# Patient Record
Sex: Male | Born: 1935 | Race: White | Hispanic: No | Smoking: Never smoker
Health system: Southern US, Community
[De-identification: ages and names within clinical notes are randomized; demographics above are authoritative.]

## PROBLEM LIST (undated history)

## (undated) DIAGNOSIS — J45909 Unspecified asthma, uncomplicated: Secondary | ICD-10-CM

## (undated) DIAGNOSIS — I639 Cerebral infarction, unspecified: Secondary | ICD-10-CM

## (undated) DIAGNOSIS — I509 Heart failure, unspecified: Secondary | ICD-10-CM

## (undated) DIAGNOSIS — I499 Cardiac arrhythmia, unspecified: Secondary | ICD-10-CM

## (undated) DIAGNOSIS — E785 Hyperlipidemia, unspecified: Secondary | ICD-10-CM

## (undated) DIAGNOSIS — R0602 Shortness of breath: Secondary | ICD-10-CM

## (undated) DIAGNOSIS — G459 Transient cerebral ischemic attack, unspecified: Secondary | ICD-10-CM

## (undated) HISTORY — DX: Heart failure, unspecified: I50.9

## (undated) HISTORY — DX: Shortness of breath: R06.02

## (undated) HISTORY — DX: Cerebral infarction, unspecified: I63.9

## (undated) HISTORY — PX: HERNIA REPAIR: SHX51

## (undated) HISTORY — PX: PACEMAKER IMPLANT: EP1218

---

## 2006-08-30 ENCOUNTER — Ambulatory Visit: Payer: Self-pay | Admitting: Ophthalmology

## 2007-06-13 ENCOUNTER — Ambulatory Visit: Payer: Self-pay | Admitting: Ophthalmology

## 2007-09-15 ENCOUNTER — Other Ambulatory Visit: Payer: Self-pay

## 2007-09-15 ENCOUNTER — Ambulatory Visit: Payer: Self-pay | Admitting: Otolaryngology

## 2008-07-10 ENCOUNTER — Ambulatory Visit: Payer: Self-pay

## 2008-08-16 ENCOUNTER — Ambulatory Visit: Payer: Self-pay | Admitting: Cardiology

## 2008-08-27 ENCOUNTER — Ambulatory Visit: Payer: Self-pay | Admitting: Internal Medicine

## 2008-10-24 ENCOUNTER — Ambulatory Visit: Payer: Self-pay | Admitting: Surgery

## 2008-10-31 ENCOUNTER — Ambulatory Visit: Payer: Self-pay | Admitting: Surgery

## 2008-10-31 ENCOUNTER — Emergency Department: Payer: Self-pay | Admitting: Emergency Medicine

## 2008-11-28 ENCOUNTER — Ambulatory Visit: Payer: Self-pay | Admitting: Otolaryngology

## 2009-08-03 HISTORY — PX: APPENDECTOMY: SHX54

## 2010-04-29 ENCOUNTER — Ambulatory Visit: Payer: Self-pay | Admitting: Surgery

## 2011-03-11 ENCOUNTER — Inpatient Hospital Stay: Payer: Self-pay | Admitting: Family Medicine

## 2012-07-27 ENCOUNTER — Inpatient Hospital Stay: Payer: Self-pay | Admitting: Surgery

## 2012-07-27 LAB — COMPREHENSIVE METABOLIC PANEL
Albumin: 3.5 g/dL (ref 3.4–5.0)
BUN: 25 mg/dL — ABNORMAL HIGH (ref 7–18)
Bilirubin,Total: 0.4 mg/dL (ref 0.2–1.0)
Chloride: 107 mmol/L (ref 98–107)
EGFR (Non-African Amer.): 54 — ABNORMAL LOW
Glucose: 137 mg/dL — ABNORMAL HIGH (ref 65–99)
Osmolality: 290 (ref 275–301)
SGOT(AST): 27 U/L (ref 15–37)
SGPT (ALT): 23 U/L (ref 12–78)
Total Protein: 6.9 g/dL (ref 6.4–8.2)

## 2012-07-27 LAB — URINALYSIS, COMPLETE
Bacteria: NONE SEEN
Bilirubin,UR: NEGATIVE
Leukocyte Esterase: NEGATIVE
Nitrite: NEGATIVE
Protein: NEGATIVE
RBC,UR: 1 /HPF (ref 0–5)
WBC UR: <1 /HPF (ref 0–5)

## 2012-07-27 LAB — CBC
HGB: 12.3 g/dL — ABNORMAL LOW (ref 13.0–18.0)
MCHC: 34.6 g/dL (ref 32.0–36.0)
Platelet: 187 10*3/uL (ref 150–440)
RDW: 13.3 % (ref 11.5–14.5)

## 2014-01-14 DIAGNOSIS — J45909 Unspecified asthma, uncomplicated: Secondary | ICD-10-CM | POA: Insufficient documentation

## 2014-01-14 DIAGNOSIS — K219 Gastro-esophageal reflux disease without esophagitis: Secondary | ICD-10-CM | POA: Insufficient documentation

## 2014-01-14 DIAGNOSIS — N183 Chronic kidney disease, stage 3 unspecified: Secondary | ICD-10-CM | POA: Insufficient documentation

## 2014-02-03 DIAGNOSIS — Z8673 Personal history of transient ischemic attack (TIA), and cerebral infarction without residual deficits: Secondary | ICD-10-CM | POA: Insufficient documentation

## 2014-02-14 DIAGNOSIS — Z950 Presence of cardiac pacemaker: Secondary | ICD-10-CM | POA: Insufficient documentation

## 2014-11-02 ENCOUNTER — Other Ambulatory Visit
Admit: 2014-11-02 | Disposition: A | Discharge status: Home / Self Care | Payer: Self-pay | Attending: Gastroenterology | Admitting: Gastroenterology

## 2014-11-02 LAB — CLOSTRIDIUM DIFFICILE(ARMC)

## 2014-11-16 ENCOUNTER — Other Ambulatory Visit
Admit: 2014-11-16 | Disposition: A | Discharge status: Home / Self Care | Payer: Self-pay | Attending: Gastroenterology | Admitting: Gastroenterology

## 2014-11-16 LAB — CLOSTRIDIUM DIFFICILE(ARMC)

## 2014-11-20 NOTE — Op Note (Signed)
PATIENT NAME:  Elijah Collins, Elijah Collins MR#:  [id number] DATE OF BIRTH:  1924/09/02  DATE OF PROCEDURE:  07/28/2012  PREOPERATIVE DIAGNOSIS: Acute appendicitis.   POSTOPERATIVE DIAGNOSIS: Acute appendicitis.   PROCEDURE PERFORMED: Laparoscopic appendectomy.   SURGEON: Quentin Orealph Collins. Ely, M.D.   ANESTHESIA: General.   DESCRIPTION OF PROCEDURE:  With the patient in the supine position, after induction of appropriate general anesthesia, the patient's abdomen was prepped with ChloraPrep and draped with sterile towels. The patient was placed in head down, feet up position. A small infraumbilical incision was made in the standard fashion and carried down bluntly through the subcutaneous tissue. The Veress needle was used to cannulate the peritoneal cavity. CO2 was insufflated to appropriate pressure measurements. When approximately 2.5 liters of CO2 were instilled, the Veress needle was withdrawn and an 11 mm [employer] port was inserted into the peritoneal cavity. Intraperitoneal position was confirmed and CO2 was reinsufflated. A right upper quadrant transverse incision was made and an 11 mm port inserted under direct vision. The right lower quadrant was investigated. There appeared to be significant suppurative appendicitis without evidence of rupture. The right lower quadrant transverse incision was made and a 12 mm port inserted under direct vision. The camera was moved to the upper port and dissection carried out through the two lower ports. The base of the appendix was identified and appeared to be free of significant disease. A window was created behind the base of the appendix and it was divided with single application of the Endo GIA stapling device carrying a blue load. Meso appendix was foreshortened and quite thickened and required multiple applications of the Endo GIA stapler carrying a white load to divide the mesoappendix. It was then captured in the Endo Catch apparatus and removed without difficulty.  The area was copiously suctioned and irrigated with 2 liters of warm saline solution. There did not appear to be any evidence of bleeding or infection. The abdomen was desufflated following Vicryl figure-of-eight closure of the right lower quadrant incision using the needle passer. All ports were withdrawn without difficulty. Skin incisions were closed with 5-0 nylon. The area was infiltrated with 0.25% Marcaine for postoperative pain control. Sterile dressings were applied. The patient was returned to the recovery room having tolerated the procedure well. Sponge, instrument and needle counts were correct x 2 in the operating room.  ____________________________ Carmie Endalph Collins. Ely III, MD rle:sb D: 07/28/2012 05:21:04 ET T: 07/28/2012 08:35:25 ET JOB#: [id number]  cc: Carmie Endalph Collins. Ely III, MD, <Dictator> Marya AmslerMarshall W. Dareen PianoAnderson, MD Quentin OreALPH Collins ELY MD ELECTRONICALLY SIGNED 07/29/2012 21:54

## 2014-11-20 NOTE — H&P (Signed)
PATIENT NAME:  Elijah Collins, REISTER MR#:  [id number] DATE OF BIRTH:  [birth year]  DATE OF ADMISSION:  07/27/2012  PRIMARY CARE PHYSICIAN: Einar Crow.   CARDIOLOGIST: Jamse Mead.   ADMITTING PHYSICIAN: Dr. Michela Pitcher.   CHIEF COMPLAINT: Abdominal pain.   BRIEF HISTORY: The patient is an 81 year old gentleman seen in the Emergency Room with a 16 to 18 hour history of abdominal pain. He woke this morning early and noted some marked suprapubic, periumbilical, right lower quadrant pain. The pain intensified over the course of the day. He was mildly anorexic but was not nauseated and did not vomit. He had no symptoms yesterday. Pain increased over the course of the day, and he came to the Emergency Room for further evaluation. Workup revealed an elevated white blood cell count, relatively normal electrolytes. Contrasted CT scan was performed which demonstrated a thickened edematous appendix with fluid and inflammatory change surrounding it, consistent with acute appendicitis. Surgical service was consulted.   The patient denies any previous similar GI problems. He denies a history of hepatitis, yellow jaundice, pancreatitis, peptic ulcer disease, gallbladder disease or diverticulitis. He has had previous laparoscopic hernia repair. He has long-standing cardiac disease, currently with a pacemaker, followed by Dr. Jamse Mead. He has had previous TIA in the past. He is hypertensive but is not diabetic. He last saw Dr. Darrold Junker about a year ago. He has not had any recent evaluations.   CURRENT MEDICATIONS: Include aspirin 81 mg p.o. daily, Bactrim DS 1 mg p.o. b.i.d., Centrum Silver 1 tablet daily, Lipitor 20 mg p.o. daily, Plavix 75 mg p.o. daily.   ALLERGIES: No medical allergies.   SOCIAL HISTORY: He is not a cigarette smoker, drinks alcohol only occasionally and is retired.   REVIEW OF SYSTEMS: A 10-point review of systems undertaken with the patient and is unremarkable.   FAMILY HISTORY:  Noncontributory.   PHYSICAL EXAMINATION:  GENERAL: He is an alert, pleasant gentleman in mild discomfort.  VITAL SIGNS: Temperature is 99.4, heart rate 75, blood pressure 135/82. Oxygen saturation is satisfactory.  HEENT: Unremarkable. There is no scleral icterus. No pupillary abnormalities. No facial deformities.  NECK: Supple and nontender with no adenopathy. His trachea is midline.  CHEST: Clear with no rales or rhonchi. He has normal pulmonary excursion without pleuritic chest pain.  CARDIAC: No murmurs or gallops to my ear, and he seems to be in normal sinus rhythm.  ABDOMEN: Soft but he has marked right lower quadrant tenderness, point tenderness and guarding. He has rebound and referred rebound to the right lower quadrant. He has hypoactive but present bowel sounds.  LOWER EXTREMITIES: Full range of motion. No deformities and good distal pulses.  PSYCHIATRIC: Normal orientation, normal affect.   RADIOLOGY: I have independently reviewed his CT scan. The scan does demonstrate thickened edematous appendix with surrounding inflammatory change. There is no free air, but changes are concerning for possible ruptured appendix.   ASSESSMENT AND PLAN: This gentleman has marked risk profile with his aspirin and Plavix. We discussed the possibility of nonoperative intervention with straight forward antibiotic therapy. The patient was appraised of the possible risk of rupture and ongoing consequences. The patient would like to proceed with surgical intervention. Risk of bleeding has been outlined to him in detail. Will check an EKG and chest x-ray while we are waiting for the operating room and plan to proceed this evening if there are no significant abnormalities. The patient is in agreement with this plan.    ____________________________ Quentin Ore III,  MD rle:gb D: 07/27/2012 21:38:15 ET T: 07/27/2012 22:11:25 ET JOB#: [id number]  cc: Carmie Endalph L. Ely III, MD, <Dictator> Marya AmslerMarshall W. Dareen PianoAnderson,  MD Marcina MillardAlexander Paraschos, MD Quentin OreALPH L ELY MD ELECTRONICALLY SIGNED 07/29/2012 21:53

## 2016-03-03 DEATH — deceased

## 2016-07-09 DIAGNOSIS — R0609 Other forms of dyspnea: Secondary | ICD-10-CM | POA: Insufficient documentation

## 2016-11-09 ENCOUNTER — Observation Stay
Admit: 2016-11-09 | Discharge: 2016-11-09 | Disposition: A | Discharge status: Home / Self Care | Payer: Medicare Other | Attending: Specialist | Admitting: Specialist

## 2016-11-09 ENCOUNTER — Observation Stay: Payer: Medicare Other

## 2016-11-09 ENCOUNTER — Emergency Department: Payer: Medicare Other

## 2016-11-09 ENCOUNTER — Observation Stay
Admission: EM | Admit: 2016-11-09 | Discharge: 2016-11-10 | Disposition: A | Discharge status: Home / Self Care | Payer: Medicare Other | Attending: Internal Medicine | Admitting: Internal Medicine

## 2016-11-09 ENCOUNTER — Encounter: Payer: Self-pay | Admitting: Emergency Medicine

## 2016-11-09 DIAGNOSIS — J45909 Unspecified asthma, uncomplicated: Secondary | ICD-10-CM | POA: Diagnosis not present

## 2016-11-09 DIAGNOSIS — Z7982 Long term (current) use of aspirin: Secondary | ICD-10-CM | POA: Insufficient documentation

## 2016-11-09 DIAGNOSIS — Z8673 Personal history of transient ischemic attack (TIA), and cerebral infarction without residual deficits: Secondary | ICD-10-CM | POA: Diagnosis not present

## 2016-11-09 DIAGNOSIS — Z823 Family history of stroke: Secondary | ICD-10-CM | POA: Insufficient documentation

## 2016-11-09 DIAGNOSIS — G451 Carotid artery syndrome (hemispheric): Secondary | ICD-10-CM

## 2016-11-09 DIAGNOSIS — Z7902 Long term (current) use of antithrombotics/antiplatelets: Secondary | ICD-10-CM | POA: Diagnosis not present

## 2016-11-09 DIAGNOSIS — G459 Transient cerebral ischemic attack, unspecified: Secondary | ICD-10-CM | POA: Diagnosis present

## 2016-11-09 DIAGNOSIS — I10 Essential (primary) hypertension: Secondary | ICD-10-CM | POA: Diagnosis not present

## 2016-11-09 DIAGNOSIS — Z950 Presence of cardiac pacemaker: Secondary | ICD-10-CM | POA: Insufficient documentation

## 2016-11-09 DIAGNOSIS — E785 Hyperlipidemia, unspecified: Secondary | ICD-10-CM | POA: Insufficient documentation

## 2016-11-09 DIAGNOSIS — R29700 NIHSS score 0: Secondary | ICD-10-CM | POA: Diagnosis not present

## 2016-11-09 HISTORY — DX: Transient cerebral ischemic attack, unspecified: G45.9

## 2016-11-09 HISTORY — DX: Unspecified asthma, uncomplicated: J45.909

## 2016-11-09 HISTORY — DX: Cardiac arrhythmia, unspecified: I49.9

## 2016-11-09 LAB — CBC
HCT: 38.4 % — ABNORMAL LOW (ref 40.0–52.0)
Hemoglobin: 13.3 g/dL (ref 13.0–18.0)
MCH: 33.8 pg (ref 26.0–34.0)
MCHC: 34.6 g/dL (ref 32.0–36.0)
MCV: 97.8 fL (ref 80.0–100.0)
Platelets: 193 10*3/uL (ref 150–440)
RBC: 3.92 MIL/uL — ABNORMAL LOW (ref 4.40–5.90)
RDW: 13.3 % (ref 11.5–14.5)
WBC: 7.5 10*3/uL (ref 3.8–10.6)

## 2016-11-09 LAB — COMPREHENSIVE METABOLIC PANEL
ALK PHOS: 33 U/L — AB (ref 38–126)
ALT: 22 U/L (ref 17–63)
AST: 25 U/L (ref 15–41)
Albumin: 4.1 g/dL (ref 3.5–5.0)
Anion gap: 7 (ref 5–15)
BILIRUBIN TOTAL: 0.7 mg/dL (ref 0.3–1.2)
BUN: 22 mg/dL — ABNORMAL HIGH (ref 6–20)
CALCIUM: 9.5 mg/dL (ref 8.9–10.3)
CO2: 27 mmol/L (ref 22–32)
CREATININE: 1.16 mg/dL (ref 0.61–1.24)
Chloride: 106 mmol/L (ref 101–111)
GFR, EST NON AFRICAN AMERICAN: 54 mL/min — AB (ref >60)
Glucose, Bld: 99 mg/dL (ref 65–99)
Potassium: 4.5 mmol/L (ref 3.5–5.1)
Sodium: 140 mmol/L (ref 135–145)
Total Protein: 7.2 g/dL (ref 6.5–8.1)

## 2016-11-09 LAB — DIFFERENTIAL
Basophils Absolute: 0 10*3/uL (ref 0–0.1)
Basophils Relative: 0 %
Eosinophils Absolute: 0.2 10*3/uL (ref 0–0.7)
Eosinophils Relative: 3 %
LYMPHS ABS: 2.1 10*3/uL (ref 1.0–3.6)
LYMPHS PCT: 28 %
MONO ABS: 0.7 10*3/uL (ref 0.2–1.0)
MONOS PCT: 9 %
NEUTROS ABS: 4.5 10*3/uL (ref 1.4–6.5)
Neutrophils Relative %: 60 %

## 2016-11-09 LAB — PROTIME-INR
INR: 0.97
PROTHROMBIN TIME: 12.9 s (ref 11.4–15.2)

## 2016-11-09 LAB — APTT: aPTT: 35 seconds (ref 24–36)

## 2016-11-09 LAB — TROPONIN I

## 2016-11-09 LAB — GLUCOSE, CAPILLARY: GLUCOSE-CAPILLARY: 91 mg/dL (ref 65–99)

## 2016-11-09 MED ORDER — ADULT MULTIVITAMIN W/MINERALS CH
1.0000 | ORAL_TABLET | Freq: Every day | ORAL | Status: DC
  Administered 2016-11-10: 1 via ORAL
  Filled 2016-11-09: qty 1

## 2016-11-09 MED ORDER — ONDANSETRON HCL 4 MG PO TABS: 4.0000 mg | ORAL_TABLET | Freq: Four times a day (QID) | ORAL | Status: DC | PRN

## 2016-11-09 MED ORDER — IOPAMIDOL (ISOVUE-370) INJECTION 76%
75.0000 mL | Freq: Once | INTRAVENOUS | Status: AC | PRN
  Administered 2016-11-09: 75 mL via INTRAVENOUS

## 2016-11-09 MED ORDER — MULTI-VITAMINS PO TABS: 1.0000 | ORAL_TABLET | Freq: Every day | ORAL | Status: DC

## 2016-11-09 MED ORDER — ACETAMINOPHEN 650 MG RE SUPP: 650.0000 mg | Freq: Four times a day (QID) | RECTAL | Status: DC | PRN

## 2016-11-09 MED ORDER — ENOXAPARIN SODIUM 40 MG/0.4ML ~~LOC~~ SOLN
40.0000 mg | SUBCUTANEOUS | Status: DC
  Administered 2016-11-09: 40 mg via SUBCUTANEOUS
  Filled 2016-11-09: qty 0.4

## 2016-11-09 MED ORDER — ONDANSETRON HCL 4 MG/2ML IJ SOLN: 4.0000 mg | Freq: Four times a day (QID) | INTRAMUSCULAR | Status: DC | PRN

## 2016-11-09 MED ORDER — ASPIRIN 81 MG PO CHEW: 81.0000 mg | CHEWABLE_TABLET | Freq: Every day | ORAL | Status: DC

## 2016-11-09 MED ORDER — CLOPIDOGREL BISULFATE 75 MG PO TABS
75.0000 mg | ORAL_TABLET | Freq: Every day | ORAL | Status: DC
  Administered 2016-11-10: 75 mg via ORAL
  Filled 2016-11-09: qty 1

## 2016-11-09 MED ORDER — METOPROLOL SUCCINATE ER 50 MG PO TB24
50.0000 mg | ORAL_TABLET | Freq: Every day | ORAL | Status: DC
  Administered 2016-11-10: 50 mg via ORAL
  Filled 2016-11-09: qty 1

## 2016-11-09 MED ORDER — ATORVASTATIN CALCIUM 20 MG PO TABS
20.0000 mg | ORAL_TABLET | Freq: Every day | ORAL | Status: DC
  Administered 2016-11-09: 23:00:00 20 mg via ORAL
  Filled 2016-11-09: qty 1

## 2016-11-09 MED ORDER — ZOLPIDEM TARTRATE 5 MG PO TABS: 5.0000 mg | ORAL_TABLET | Freq: Every evening | ORAL | Status: DC | PRN

## 2016-11-09 MED ORDER — ACETAMINOPHEN 325 MG PO TABS: 650.0000 mg | ORAL_TABLET | Freq: Four times a day (QID) | ORAL | Status: DC | PRN

## 2016-11-09 NOTE — ED Notes (Signed)
Dr. Mayford Knife in room to reassess patient.

## 2016-11-09 NOTE — ED Triage Notes (Signed)
Patient presents to the ED for some confusion that began around 9:30am.  Patient states, "I feel kind of fuzzy headed."  Patient was unable to tell the triage nurse what year it was this year.  Patient is now answering questions appropriately.

## 2016-11-09 NOTE — Progress Notes (Signed)
Chaplain provided an emotional support support to patient, his daughter and the medical team that was treating the patient. After doctors did their tests, Chaplain asked patient if Chaplain could do anything to help him emotionally or spiritually. Patient appreciated the offer but he said he was fine.

## 2016-11-09 NOTE — Consult Note (Addendum)
Reason for Consult:confusion  Referring Physician: Dr. Mayford Knife   CC: conrfusion  HPI: Elijah Collins. is an 83 y.o. male with hx of HTN, PPM placement about 10 yrs ago on asa and plavix.  Woke up normal health around 6AM. At 9:30 AM pt was found to be confused and disoriented which quickly resolved.  Pt is back to baseline. NIHSS is 0  Past Medical History:  Diagnosis Date  . Cardiac arrhythmia   . TIA (transient ischemic attack)     Past Surgical History:  Procedure Laterality Date  . PACEMAKER IMPLANT      No family history on file.  Social History:  reports that he has never smoked. He has never used smokeless tobacco. He reports that he does not drink alcohol. His drug history is not on file.  No Known Allergies  Medications: I have reviewed the patient's current medications.  ROS: History obtained from the patient  General ROS: negative for - chills, fatigue, fever, night sweats, weight gain or weight loss Psychological ROS: negative for - behavioral disorder, hallucinations, memory difficulties, mood swings or suicidal ideation Ophthalmic ROS: negative for - blurry vision, double vision, eye pain or loss of vision ENT ROS: negative for - epistaxis, nasal discharge, oral lesions, sore throat, tinnitus or vertigo Allergy and Immunology ROS: negative for - hives or itchy/watery eyes Hematological and Lymphatic ROS: negative for - bleeding problems, bruising or swollen lymph nodes Endocrine ROS: negative for - galactorrhea, hair pattern changes, polydipsia/polyuria or temperature intolerance Respiratory ROS: negative for - cough, hemoptysis, shortness of breath or wheezing Cardiovascular ROS: negative for - chest pain, dyspnea on exertion, edema or irregular heartbeat Gastrointestinal ROS: negative for - abdominal pain, diarrhea, hematemesis, nausea/vomiting or stool incontinence Genito-Urinary ROS: negative for - dysuria, hematuria, incontinence or urinary  frequency/urgency Musculoskeletal ROS: negative for - joint swelling or muscular weakness Neurological ROS: as noted in HPI Dermatological ROS: negative for rash and skin lesion changes  Physical Examination: Blood pressure (!) 187/88, pulse 86, temperature 97.9 F (36.6 C), temperature source Oral, resp. rate 18, height  (1.778 m), weight 92.1 kg (203 lb 1.6 oz), SpO2 99 %.   Neurological Examination   Mental Status: Alert, oriented, thought content appropriate.  Speech fluent without evidence of aphasia.  Able to follow 3 step commands without difficulty. Cranial Nerves: II: Discs flat bilaterally; Visual fields grossly normal, pupils equal, round, reactive to light and accommodation III,IV, VI: ptosis not present, extra-ocular motions intact bilaterally V,VII: smile symmetric, facial light touch sensation normal bilaterally VIII: hearing normal bilaterally IX,X: gag reflex present XI: bilateral shoulder shrug XII: midline tongue extension Motor: Right : Upper extremity   5/5    Left:     Upper extremity   5/5  Lower extremity   5/5     Lower extremity   5/5 Tone and bulk:normal tone throughout; no atrophy noted Sensory: Pinprick and light touch intact throughout, bilaterally Deep Tendon Reflexes: 2+ and symmetric throughout Plantars: Right: downgoing   Left: downgoing Cerebellar: normal finger-to-nose, normal rapid alternating movements and normal heel-to-shin test Gait: not tested       Laboratory Studies:   Basic Metabolic Panel: No results for input(s): NA, K, CL, CO2, GLUCOSE, BUN, CREATININE, CALCIUM, MG, PHOS in the last 168 hours.  Liver Function Tests: No results for input(s): AST, ALT, ALKPHOS, BILITOT, PROT, ALBUMIN in the last 168 hours. No results for input(s): LIPASE, AMYLASE in the last 168 hours. No results for input(s): AMMONIA in  the last 168 hours.  CBC:  Recent Labs Lab 11/09/16 1146  WBC 7.5  NEUTROABS 4.5  HGB 13.3  HCT 38.4*  MCV  97.8  PLT 193    Cardiac Enzymes: No results for input(s): CKTOTAL, CKMB, CKMBINDEX, TROPONINI in the last 168 hours.  BNP: Invalid input(s): POCBNP  CBG:  Recent Labs Lab 11/09/16 1128  GLUCAP 91    Microbiology: Results for orders placed or performed during the hospital encounter of 11/16/14  [facility])     Status: None   Collection Time: 11/16/14  6:30 AM  Result Value Ref Range Status   Micro Text Report   Final       C.DIFFICILE ANTIGEN       C.DIFFICILE GDH ANTIGEN : POSITIVE   C.DIFFICILE TOXIN A/B     C.DIFFICILE TOXINS A AND/OR B: POSITIVE   INTERPRETATION            Positive for toxigenic C. difficile, active toxin production present.    ANTIBIOTIC                                                        Coagulation Studies: No results for input(s): LABPROT, INR in the last 72 hours.  Urinalysis: No results for input(s): COLORURINE, LABSPEC, PHURINE, GLUCOSEU, HGBUR, BILIRUBINUR, KETONESUR, PROTEINUR, UROBILINOGEN, NITRITE, LEUKOCYTESUR in the last 168 hours.  Invalid input(s): APPERANCEUR  Lipid Panel:  No results found for: CHOL, TRIG, HDL, CHOLHDL, VLDL, LDLCALC  HgbA1C: No results found for: HGBA1C  Urine Drug Screen:  No results found for: LABOPIA, COCAINSCRNUR, LABBENZ, AMPHETMU, THCU, LABBARB  Alcohol Level: No results for input(s): ETH in the last 168 hours.  Other results: EKG: normal EKG, normal sinus rhythm, unchanged from previous tracings.  Imaging: Ct Head Code Stroke W/o Cm  Addendum Date: 11/09/2016   ADDENDUM REPORT: 11/09/2016 11:57 ADDENDUM: Study discussed by telephone with Dr. Daryel November on 11/09/2016 at 1141 hours. Electronically Signed   By: Odessa Fleming M.D.   On: 11/09/2016 11:57   Result Date: 11/09/2016 CLINICAL DATA:  Code stroke. 83 year old male with confusion and slurred speech starting this morning. EXAM: CT HEAD WITHOUT CONTRAST TECHNIQUE: Contiguous axial images were obtained from the base of the skull  through the vertex without intravenous contrast. COMPARISON:  None. FINDINGS: Brain: No midline shift, ventriculomegaly, mass effect, evidence of mass lesion, or acute intracranial hemorrhage. Minimal to mild for age nonspecific white matter hypodensity. No cortical encephalomalacia. No CT evidence of acute cortically based infarct. Vascular: No suspicious intracranial vascular hyperdensity. Skull: No acute osseous abnormality identified. Sinuses/Orbits: Clear. Other: Negative visible orbit and scalp soft tissues. ASPECTS [facility] Stroke Program Early CT Score) - Ganglionic level infarction (caudate, lentiform nuclei, internal capsule, insula, M1-M3 cortex): 7 - Supraganglionic infarction (M4-M6 cortex): 3 Total score (0-10 with 10 being normal): 10 IMPRESSION: 1. No acute cortically based infarct or acute intracranial hemorrhage identified. Mild for age nonspecific white matter changes. 2. ASPECTS is 10. Electronically Signed: By: Odessa Fleming M.D. On: 11/09/2016 11:38     Assessment/Plan:   Elijah BARNEY Sr. is an 83 y.o. male with hx of HTN, PPM placement about 10 yrs ago on plavix.  Woke up normal health around 6AM. At 9:30 AM pt was found to be confused and disoriented which quickly resolved.  Pt is  back to baseline. NIHSS is 0  - TIA work up CTA head and neck as has PPM and unable to obtain MRI 2d echo Pt/ot con't plavix and home ASA dose.  Likely d/c planning in AM  11/09/2016, 12:01 PM

## 2016-11-09 NOTE — ED Provider Notes (Addendum)
[facility] Emergency Department Provider Note       Time seen: ----------------------------------------- 11:23 AM on 11/09/2016 -----------------------------------------     I have reviewed the triage vital signs and the nursing notes.   HISTORY   Chief Complaint Code Stroke    HPI Elijah Collins. is a 83 y.o. male who presents to the ED for confusion that started 2 hours ago. Patient was not answering questions correctly, currently he states he is confused and he cannot remember certain things but otherwise denies any focal neurologic deficits. He denies numbness, tingling, weakness, vision trouble, speech trouble or other complaints.   No past medical history on file.  There are no active problems to display for this patient.   No past surgical history on file.  Allergies Patient has no allergy information on record.  Social History Social History  Substance Use Topics  . Smoking status: Not on file  . Smokeless tobacco: Not on file  . Alcohol use Not on file   Review of Systems Constitutional: Negative for fever. Cardiovascular: Negative for chest pain. Respiratory: Negative for shortness of breath. Gastrointestinal: Negative for abdominal pain, vomiting and diarrhea. Genitourinary: Negative for dysuria. Musculoskeletal: Negative for back pain. Skin: Negative for rash. Neurological: Negative for headaches, focal weakness or numbness.Positive for confusion  10-point ROS otherwise negative.  ____________________________________________   PHYSICAL EXAM:  VITAL SIGNS: ED Triage Vitals [11/09/16 1116]  Enc Vitals Group     BP (!) 187/88     Pulse Rate 86     Resp 18     Temp 97.9 F (36.6 C)     Temp Source Oral     SpO2 99 %     Weight      Height      Head Circumference      Peak Flow      Pain Score      Pain Loc      Pain Edu?      Excl. in GC?     Constitutional: Alert and oriented. Well appearing and in no  distress. Eyes: Conjunctivae are normal. PERRL. Normal extraocular movements. ENT   Head: Normocephalic and atraumatic.   Nose: No congestion/rhinnorhea.   Mouth/Throat: Mucous membranes are moist.   Neck: No stridor. Cardiovascular: Normal rate, regular rhythm. No murmurs, rubs, or gallops. Respiratory: Normal respiratory effort without tachypnea nor retractions. Breath sounds are clear and equal bilaterally. No wheezes/rales/rhonchi. Gastrointestinal: Soft and nontender. Normal bowel sounds Musculoskeletal: Nontender with normal range of motion in extremities. No lower extremity tenderness nor edema. Neurologic:  Normal speech and language. No gross focal neurologic deficits are appreciated. Strength, sensation, cranial nerves appear to be intact. No focal deficits are appreciated Skin:  Skin is warm, dry and intact. No rash noted. Psychiatric: Mood and affect are normal. Speech and behavior are normal.  ____________________________________________  EKG: Interpreted by me. Atrial paced rhythm with a rate of 72 bpm, no evidence of acute abnormality, normal pacemaker spikes.  ____________________________________________  ED COURSE:  Pertinent labs & imaging results that were available during my care of the patient were reviewed by me and considered in my medical decision making (see chart for details). Patient presents for confusion, we will assess with labs and imaging as indicated.   Procedures ____________________________________________   LABS (pertinent positives/negatives)  Labs Reviewed  CBC - Abnormal; Notable for the following:       Result Value   RBC 3.92 (*)    HCT 38.4 (*)  All other components within normal limits  COMPREHENSIVE METABOLIC PANEL - Abnormal; Notable for the following:    BUN 22 (*)    Alkaline Phosphatase 33 (*)    GFR calc non Af Amer 54 (*)    All other components within normal limits  PROTIME-INR  APTT  DIFFERENTIAL  TROPONIN I   GLUCOSE, CAPILLARY  CBG MONITORING, ED    RADIOLOGY  CT head Is unremarkable ____________________________________________  FINAL ASSESSMENT AND PLAN  Confusion, Possible TIA  Plan: Patient's labs and imaging were dictated above. Patient had presented for acute onset of confusion and memory disturbance. He's been evaluated by neurology who recommends admission and full TIA workup including MRI, echo and carotid Doppler. He'll be given anti-platelet therapy    Emily Filbert, MD   Note: This note was generated in part or whole with voice recognition software. Voice recognition is usually quite accurate but there are transcription errors that can and very often do occur. I apologize for any typographical errors that were not detected and corrected.     Emily Filbert, MD 11/09/16 1258    Emily Filbert, MD 11/09/16 [phone]

## 2016-11-09 NOTE — H&P (Signed)
[facility] [place] [facility]   PATIENT NAME: Elijah Collins    MR#:  [id number]  DATE OF BIRTH:  11-Dec-1924  DATE OF ADMISSION:  11/09/2016  PRIMARY CARE PHYSICIAN: Lauro Regulus., MD   REQUESTING/REFERRING PHYSICIAN: Dr. Daryel November.  CHIEF COMPLAINT:   Chief Complaint  Patient presents with  . Code Stroke    HISTORY OF PRESENT ILLNESS:  Sampson Self  is a 83 y.o. male with a known history of Previous TIA, hypertension, hyperlipidemia who presents to the hospital due to confusion/altered mental status. Patient was seen as usual state of health when this morning after having breakfast with some of his friends he came home and was a bit confused. He says he could not figure out how to refill his prescription medications which had expired, he also tried to look up his tox but could not remember the symbols or how to check them when he normally can.  He called his daughter who then brought him to the ER for further evaluation. A code stroke was initiated in the ER and patient underwent a CT head which was negative for any acute pathology. He is now back to baseline and hospitalist services were contacted for further treatment and evaluation. She denies any headache, numbness, tingling, weakness, chest pain, shortness of breath, nausea vomiting or any other associated symptoms presently.  PAST MEDICAL HISTORY:   Past Medical History:  Diagnosis Date  . Cardiac arrhythmia   . TIA (transient ischemic attack)     PAST SURGICAL HISTORY:   Past Surgical History:  Procedure Laterality Date  . PACEMAKER IMPLANT      SOCIAL HISTORY:   Social History  Substance Use Topics  . Smoking status: Never Smoker  . Smokeless tobacco: Never Used  . Alcohol use No    FAMILY HISTORY:   Family History  Problem Relation Age of Onset  . CVA Mother   . COPD Father     DRUG ALLERGIES:  No Known Allergies  REVIEW OF SYSTEMS:   Review of Systems   Constitutional: Negative for fever and weight loss.  HENT: Negative for congestion, nosebleeds and tinnitus.   Eyes: Negative for blurred vision, double vision and redness.  Respiratory: Negative for cough, hemoptysis and shortness of breath.   Cardiovascular: Negative for chest pain, orthopnea, leg swelling and PND.  Gastrointestinal: Negative for abdominal pain, diarrhea, melena, nausea and vomiting.  Genitourinary: Negative for dysuria, hematuria and urgency.  Musculoskeletal: Negative for falls and joint pain.  Neurological: Negative for dizziness, tingling, sensory change, focal weakness, seizures, weakness and headaches.  Endo/Heme/Allergies: Negative for polydipsia. Does not bruise/bleed easily.  Psychiatric/Behavioral: Negative for depression and memory loss. The patient is not nervous/anxious.     MEDICATIONS AT HOME:   Prior to Admission medications   Medication Sig Start Date End Date Taking? Authorizing Provider  aspirin 81 MG chewable tablet Chew 81 mg by mouth at bedtime.   Yes Historical Provider, MD  atorvastatin (LIPITOR) 20 MG tablet Take 20 mg by mouth at bedtime.   Yes Historical Provider, MD  clopidogrel (PLAVIX) 75 MG tablet Take 75 mg by mouth daily.   Yes Historical Provider, MD  metoprolol succinate (TOPROL-XL) 50 MG 24 hr tablet Take 50 mg by mouth daily. Take with or immediately following a meal.   Yes Historical Provider, MD  Multiple Vitamin (MULTI-VITAMINS) TABS Take 1 tablet by mouth daily.   Yes Historical Provider, MD  zolpidem (AMBIEN) 5 MG tablet Take 5 mg by  mouth at bedtime as needed for sleep.    Yes Historical Provider, MD      VITAL SIGNS:  Blood pressure (!) 144/84, pulse 60, temperature 97.9 F (36.6 C), temperature source Oral, resp. rate 15, height  (1.778 m), weight 92.1 kg (203 lb 1.6 oz), SpO2 99 %.  PHYSICAL EXAMINATION:  Physical Exam  GENERAL:  83 y.o.-year-old patient lying in bed in no acute distress.  EYES: Pupils equal,  round, reactive to light and accommodation. No scleral icterus. Extraocular muscles intact.  HEENT: Head atraumatic, normocephalic. Oropharynx and nasopharynx clear. No oropharyngeal erythema, moist oral mucosa  NECK:  Supple, no jugular venous distention. No thyroid enlargement, no tenderness.  LUNGS: Normal breath sounds bilaterally, no wheezing, rales, rhonchi. No use of accessory muscles of respiration.  CARDIOVASCULAR: S1, S2 RRR. No murmurs, rubs, gallops, clicks.  ABDOMEN: Soft, nontender, nondistended. Bowel sounds present. No organomegaly or mass.  EXTREMITIES: No pedal edema, cyanosis, or clubbing. + 2 pedal & radial pulses b/l.   NEUROLOGIC: Cranial nerves II through XII are intact. No focal Motor or sensory deficits appreciated b/l PSYCHIATRIC: The patient is alert and oriented x 3.  SKIN: No obvious rash, lesion, or ulcer.   LABORATORY PANEL:   CBC  Recent Labs Lab 11/09/16 1146  WBC 7.5  HGB 13.3  HCT 38.4*  PLT 193   ------------------------------------------------------------------------------------------------------------------  Chemistries   Recent Labs Lab 11/09/16 1146  NA 140  K 4.5  CL 106  CO2 27  GLUCOSE 99  BUN 22*  CREATININE 1.16  CALCIUM 9.5  AST 25  ALT 22  ALKPHOS 33*  BILITOT 0.7   ------------------------------------------------------------------------------------------------------------------  Cardiac Enzymes  Recent Labs Lab 11/09/16 1146  TROPONINI <0.03   ------------------------------------------------------------------------------------------------------------------  RADIOLOGY:  Ct Head Code Stroke W/o Cm  Addendum Date: 11/09/2016   ADDENDUM REPORT: 11/09/2016 11:57 ADDENDUM: Study discussed by telephone with Dr. Daryel November on 11/09/2016 at 1141 hours. Electronically Signed   By: Odessa Fleming M.D.   On: 11/09/2016 11:57   Result Date: 11/09/2016 CLINICAL DATA:  Code stroke. 83 year old male with confusion and slurred  speech starting this morning. EXAM: CT HEAD WITHOUT CONTRAST TECHNIQUE: Contiguous axial images were obtained from the base of the skull through the vertex without intravenous contrast. COMPARISON:  None. FINDINGS: Brain: No midline shift, ventriculomegaly, mass effect, evidence of mass lesion, or acute intracranial hemorrhage. Minimal to mild for age nonspecific white matter hypodensity. No cortical encephalomalacia. No CT evidence of acute cortically based infarct. Vascular: No suspicious intracranial vascular hyperdensity. Skull: No acute osseous abnormality identified. Sinuses/Orbits: Clear. Other: Negative visible orbit and scalp soft tissues. ASPECTS [facility] Stroke Program Early CT Score) - Ganglionic level infarction (caudate, lentiform nuclei, internal capsule, insula, M1-M3 cortex): 7 - Supraganglionic infarction (M4-M6 cortex): 3 Total score (0-10 with 10 being normal): 10 IMPRESSION: 1. No acute cortically based infarct or acute intracranial hemorrhage identified. Mild for age nonspecific white matter changes. 2. ASPECTS is 10. Electronically Signed: By: Odessa Fleming M.D. On: 11/09/2016 11:38     IMPRESSION AND PLAN:   83 year old male with past medical history of hypertension, hyperlipidemia, history of previous TIA who presents to the hospital due to altered mental status/confusion.  1. Altered mental status/confusion-a code stroke was initiated by the ER, patient CT head on admission was negative for acute pathology. -Patient has a pacemaker and therefore cannot have an MRI. I will do a CTA of the head and neck, and echocardiogram. -Continue aspirin, Plavix, statin. Appreciate neurology consult  and continue further care as per them.  2. Essential hypertension-continue Toprol.  3. Hyperlipidemia-continue atorvastatin.  4. History of previous TIA-continue aspirin, Plavix, statin.  Discharge home tomorrow if work up is negative.    All the records are reviewed and case discussed with ED  provider. Management plans discussed with the patient, family and they are in agreement.  CODE STATUS: Full code  TOTAL TIME TAKING CARE OF THIS PATIENT: 40 minutes.    Houston Siren M.D on 11/09/2016 at 2:21 PM  Between 7am to 6pm - Pager - [phone]  After 6pm go to [email or web address] - password [email or web address] [email or web address]  [place] [facility]  Office  [phone]  CC: Primary care physician; Lauro Regulus., MD

## 2016-11-10 DIAGNOSIS — G459 Transient cerebral ischemic attack, unspecified: Secondary | ICD-10-CM | POA: Diagnosis not present

## 2016-11-10 LAB — ECHOCARDIOGRAM COMPLETE
Height: 70 in
Weight: 3188.8 oz

## 2016-11-10 NOTE — Plan of Care (Signed)
MD making rounds. Received order to discharge home. IV removed. Patient provided with Education Handout. No prescription for patient. Discharge paperwork provided, explained, signed and witnessed. No questions left unanswered. Discharged via wheelchair by auxiliary staff. Belongings sent with patient and family.

## 2016-11-10 NOTE — Care Management (Signed)
Admitted to [facility] under observation status with the diagnosis of TIA. Lives with wife, Jacqulyn Cane and daughter Fulton Mole, [phone]). Last seen Dr. Dareen Piano about a month ago. No Home Health., No skilled facility. No home oxygen. Bicycle and Treadmill in the home. Prescriptions are filled at [facility]. Takes care of all basic activities of daily living himself, drives. No falls. Good appetite. Daughter or son will transport. States that his wife has had a stroke and he has help and cares for her in the home. Gwenette Greet RN MSN CCM Care Management [phone]

## 2016-11-10 NOTE — Consult Note (Signed)
Reason for Consult:confusion  Referring Physician: Dr. Mayford Knife   CC: conrfusion  Back to baseline no symptoms this AM  Past Medical History:  Diagnosis Date  . Asthma   . Cardiac arrhythmia   . TIA (transient ischemic attack)     Past Surgical History:  Procedure Laterality Date  . PACEMAKER IMPLANT      Family History  Problem Relation Age of Onset  . CVA Mother   . COPD Father     Social History:  reports that he has never smoked. He has never used smokeless tobacco. He reports that he does not drink alcohol or use drugs.  No Known Allergies  Medications: I have reviewed the patient's current medications.  ROS: History obtained from the patient  General ROS: negative for - chills, fatigue, fever, night sweats, weight gain or weight loss Psychological ROS: negative for - behavioral disorder, hallucinations, memory difficulties, mood swings or suicidal ideation Ophthalmic ROS: negative for - blurry vision, double vision, eye pain or loss of vision ENT ROS: negative for - epistaxis, nasal discharge, oral lesions, sore throat, tinnitus or vertigo Allergy and Immunology ROS: negative for - hives or itchy/watery eyes Hematological and Lymphatic ROS: negative for - bleeding problems, bruising or swollen lymph nodes Endocrine ROS: negative for - galactorrhea, hair pattern changes, polydipsia/polyuria or temperature intolerance Respiratory ROS: negative for - cough, hemoptysis, shortness of breath or wheezing Cardiovascular ROS: negative for - chest pain, dyspnea on exertion, edema or irregular heartbeat Gastrointestinal ROS: negative for - abdominal pain, diarrhea, hematemesis, nausea/vomiting or stool incontinence Genito-Urinary ROS: negative for - dysuria, hematuria, incontinence or urinary frequency/urgency Musculoskeletal ROS: negative for - joint swelling or muscular weakness Neurological ROS: as noted in HPI Dermatological ROS: negative for rash and skin lesion  changes  Physical Examination: Blood pressure (!) 116/53, pulse 64, temperature 98.3 F (36.8 C), temperature source Oral, resp. rate 18, height  (1.778 m), weight 90.4 kg (199 lb 4.8 oz), SpO2 97 %.   Neurological Examination   Mental Status: Alert, oriented, thought content appropriate.  Speech fluent without evidence of aphasia.  Able to follow 3 step commands without difficulty. Cranial Nerves: II: Discs flat bilaterally; Visual fields grossly normal, pupils equal, round, reactive to light and accommodation III,IV, VI: ptosis not present, extra-ocular motions intact bilaterally V,VII: smile symmetric, facial light touch sensation normal bilaterally VIII: hearing normal bilaterally IX,X: gag reflex present XI: bilateral shoulder shrug XII: midline tongue extension Motor: Right : Upper extremity   5/5    Left:     Upper extremity   5/5  Lower extremity   5/5     Lower extremity   5/5 Tone and bulk:normal tone throughout; no atrophy noted Sensory: Pinprick and light touch intact throughout, bilaterally Deep Tendon Reflexes: 2+ and symmetric throughout Plantars: Right: downgoing   Left: downgoing Cerebellar: normal finger-to-nose, normal rapid alternating movements and normal heel-to-shin test Gait: not tested       Laboratory Studies:   Basic Metabolic Panel:  Recent Labs Lab 11/09/16 1146  NA 140  K 4.5  CL 106  CO2 27  GLUCOSE 99  BUN 22*  CREATININE 1.16  CALCIUM 9.5    Liver Function Tests:  Recent Labs Lab 11/09/16 1146  AST 25  ALT 22  ALKPHOS 33*  BILITOT 0.7  PROT 7.2  ALBUMIN 4.1   No results for input(s): LIPASE, AMYLASE in the last 168 hours. No results for input(s): AMMONIA in the last 168 hours.  CBC:  Recent  Labs Lab 11/09/16 1146  WBC 7.5  NEUTROABS 4.5  HGB 13.3  HCT 38.4*  MCV 97.8  PLT 193    Cardiac Enzymes:  Recent Labs Lab 11/09/16 1146  TROPONINI <0.03    BNP: Invalid input(s): POCBNP  CBG:  Recent  Labs Lab 11/09/16 1128  GLUCAP 91    Microbiology: Results for orders placed or performed during the hospital encounter of 11/16/14  Clostridium Difficile [facility])     Status: None   Collection Time: 11/16/14  6:30 AM  Result Value Ref Range Status   Micro Text Report   Final       C.DIFFICILE ANTIGEN       C.DIFFICILE GDH ANTIGEN : POSITIVE   C.DIFFICILE TOXIN A/B     C.DIFFICILE TOXINS A AND/OR B: POSITIVE   INTERPRETATION            Positive for toxigenic C. difficile, active toxin production present.    ANTIBIOTIC                                                        Coagulation Studies:  Recent Labs  11/09/16 1146  LABPROT 12.9  INR 0.97    Urinalysis: No results for input(s): COLORURINE, LABSPEC, PHURINE, GLUCOSEU, HGBUR, BILIRUBINUR, KETONESUR, PROTEINUR, UROBILINOGEN, NITRITE, LEUKOCYTESUR in the last 168 hours.  Invalid input(s): APPERANCEUR  Lipid Panel:  No results found for: CHOL, TRIG, HDL, CHOLHDL, VLDL, LDLCALC  HgbA1C: No results found for: HGBA1C  Urine Drug Screen:  No results found for: LABOPIA, COCAINSCRNUR, LABBENZ, AMPHETMU, THCU, LABBARB  Alcohol Level: No results for input(s): ETH in the last 168 hours.  Other results: EKG: normal EKG, normal sinus rhythm, unchanged from previous tracings.  Imaging: Ct Angio Head W Or Wo Contrast  Result Date: 11/09/2016 CLINICAL DATA:  Transient episode of confusion this morning. EXAM: CT ANGIOGRAPHY HEAD AND NECK TECHNIQUE: Multidetector CT imaging of the head and neck was performed using the standard protocol during bolus administration of intravenous contrast. Multiplanar CT image reconstructions and MIPs were obtained to evaluate the vascular anatomy. Carotid stenosis measurements (when applicable) are obtained utilizing NASCET criteria, using the distal internal carotid diameter as the denominator. CONTRAST:  75 mL Isovue 370 COMPARISON:  Noncontrast head CT earlier today FINDINGS: CTA NECK FINDINGS Aortic  arch: Standard 3 vessel aortic arch. Widely patent brachiocephalic and subclavian arteries. Right carotid system: Patent without evidence of stenosis or dissection. Minimal atherosclerotic plaque at the carotid bifurcation. Left carotid system: Patent without evidence of stenosis or dissection. Mild, predominantly calcified plaque at the carotid bifurcation and in the proximal ICA. Vertebral arteries: Patent without evidence of stenosis or dissection. Dominant right vertebral artery. Skeleton: Moderate cervical disc and facet degeneration. Grade 1 anterolisthesis of C3 on C4. Other neck: Scattered low-density subcutaneous lesions in the posterior lower neck/ upper back, likely sebaceous cysts and with the largest measuring 3.2 cm. Upper chest: Clear lung apices. Review of the MIP images confirms the above findings CTA HEAD FINDINGS Anterior circulation: The internal carotid arteries are widely patent from skullbase to carotid termini. ACAs and MCAs are patent without evidence of major branch occlusion or significant stenosis. No aneurysm. Posterior circulation: The intracranial vertebral arteries are widely patent to the basilar with the right being dominant. Patent PICA and SCA origins are visualized bilaterally. The basilar artery  is widely patent. There are small bilateral posterior communicating arteries. PCAs are patent without evidence of significant stenosis. No aneurysm. Venous sinuses: Patent. Anatomic variants: None. Delayed phase: No abnormal enhancement. Review of the MIP images confirms the above findings IMPRESSION: 1. No large vessel occlusion or significant stenosis. 2. Very mild cervical carotid atherosclerosis. Electronically Signed   By: Sebastian Ache M.D.   On: 11/09/2016 14:49   Ct Angio Neck W Or Wo Contrast  Result Date: 11/09/2016 CLINICAL DATA:  Transient episode of confusion this morning. EXAM: CT ANGIOGRAPHY HEAD AND NECK TECHNIQUE: Multidetector CT imaging of the head and neck was  performed using the standard protocol during bolus administration of intravenous contrast. Multiplanar CT image reconstructions and MIPs were obtained to evaluate the vascular anatomy. Carotid stenosis measurements (when applicable) are obtained utilizing NASCET criteria, using the distal internal carotid diameter as the denominator. CONTRAST:  75 mL Isovue 370 COMPARISON:  Noncontrast head CT earlier today FINDINGS: CTA NECK FINDINGS Aortic arch: Standard 3 vessel aortic arch. Widely patent brachiocephalic and subclavian arteries. Right carotid system: Patent without evidence of stenosis or dissection. Minimal atherosclerotic plaque at the carotid bifurcation. Left carotid system: Patent without evidence of stenosis or dissection. Mild, predominantly calcified plaque at the carotid bifurcation and in the proximal ICA. Vertebral arteries: Patent without evidence of stenosis or dissection. Dominant right vertebral artery. Skeleton: Moderate cervical disc and facet degeneration. Grade 1 anterolisthesis of C3 on C4. Other neck: Scattered low-density subcutaneous lesions in the posterior lower neck/ upper back, likely sebaceous cysts and with the largest measuring 3.2 cm. Upper chest: Clear lung apices. Review of the MIP images confirms the above findings CTA HEAD FINDINGS Anterior circulation: The internal carotid arteries are widely patent from skullbase to carotid termini. ACAs and MCAs are patent without evidence of major branch occlusion or significant stenosis. No aneurysm. Posterior circulation: The intracranial vertebral arteries are widely patent to the basilar with the right being dominant. Patent PICA and SCA origins are visualized bilaterally. The basilar artery is widely patent. There are small bilateral posterior communicating arteries. PCAs are patent without evidence of significant stenosis. No aneurysm. Venous sinuses: Patent. Anatomic variants: None. Delayed phase: No abnormal enhancement. Review of  the MIP images confirms the above findings IMPRESSION: 1. No large vessel occlusion or significant stenosis. 2. Very mild cervical carotid atherosclerosis. Electronically Signed   By: Sebastian Ache M.D.   On: 11/09/2016 14:49   Ct Head Code Stroke W/o Cm  Addendum Date: 11/09/2016   ADDENDUM REPORT: 11/09/2016 11:57 ADDENDUM: Study discussed by telephone with Dr. Daryel November on 11/09/2016 at 1141 hours. Electronically Signed   By: Odessa Fleming M.D.   On: 11/09/2016 11:57   Result Date: 11/09/2016 CLINICAL DATA:  Code stroke. 83 year old male with confusion and slurred speech starting this morning. EXAM: CT HEAD WITHOUT CONTRAST TECHNIQUE: Contiguous axial images were obtained from the base of the skull through the vertex without intravenous contrast. COMPARISON:  None. FINDINGS: Brain: No midline shift, ventriculomegaly, mass effect, evidence of mass lesion, or acute intracranial hemorrhage. Minimal to mild for age nonspecific white matter hypodensity. No cortical encephalomalacia. No CT evidence of acute cortically based infarct. Vascular: No suspicious intracranial vascular hyperdensity. Skull: No acute osseous abnormality identified. Sinuses/Orbits: Clear. Other: Negative visible orbit and scalp soft tissues. ASPECTS [facility] Stroke Program Early CT Score) - Ganglionic level infarction (caudate, lentiform nuclei, internal capsule, insula, M1-M3 cortex): 7 - Supraganglionic infarction (M4-M6 cortex): 3 Total score (0-10 with 10 being normal): 10  IMPRESSION: 1. No acute cortically based infarct or acute intracranial hemorrhage identified. Mild for age nonspecific white matter changes. 2. ASPECTS is 10. Electronically Signed: By: Odessa Fleming M.D. On: 11/09/2016 11:38     Assessment/Plan:   Elijah Collins Sr. is an 83 y.o. male with hx of HTN, PPM placement about 10 yrs ago on plavix.  Woke up normal health around 6AM. At 9:30 AM pt was found to be confused and disoriented which quickly resolved.  Pt is back to  baseline. NIHSS is 0  CTA no acute abnormalities and pt is back to baseline - con't ASA and plavix  - d/c today - no further imaging/testing from neuro stand point   11/10/2016, 12:01 PM

## 2016-11-10 NOTE — Care Management Obs Status (Signed)
MEDICARE OBSERVATION STATUS NOTIFICATION   Patient Details  Name: Elijah AGNER Sr. MRN: [id number] Date of Birth: 06-28-1925   Medicare Observation Status Notification Given:  Yes    Gwenette Greet, RN 11/10/2016, 8:38 AM

## 2016-11-10 NOTE — Discharge Instructions (Signed)
[facility] - [place] at [facility] ° °DIET:  °Cardiac diet ° °DISCHARGE CONDITION:  °Stable ° °ACTIVITY:  °Activity as tolerated ° °OXYGEN:  °Home Oxygen: No. °  °Oxygen Delivery: room air ° °DISCHARGE LOCATION:  °home  ° ° °ADDITIONAL DISCHARGE INSTRUCTION: ° ° °If you experience worsening of your admission symptoms, develop shortness of breath, life threatening emergency, suicidal or homicidal thoughts you must seek medical attention immediately by calling 911 or calling your MD immediately  if symptoms less severe. ° °You Must read complete instructions/literature along with all the possible adverse reactions/side effects for all the Medicines you take and that have been prescribed to you. Take any new Medicines after you have completely understood and accpet all the possible adverse reactions/side effects.  ° °Please note ° °You were cared for by a hospitalist during your hospital stay. If you have any questions about your discharge medications or the care you received while you were in the hospital after you are discharged, you can call the unit and asked to speak with the hospitalist on call if the hospitalist that took care of you is not available. Once you are discharged, your primary care physician will handle any further medical issues. Please note that NO REFILLS for any discharge medications will be authorized once you are discharged, as it is imperative that you return to your primary care physician (or establish a relationship with a primary care physician if you do not have one) for your aftercare needs so that they can reassess your need for medications and monitor your lab values. ° ° °

## 2016-11-10 NOTE — Discharge Summary (Signed)
[facility] - [place] at [facility] 83 y.o., DOB [birth year], MRN [id number]. Admission date: 11/09/2016 Discharge Date 11/10/2016 Primary MD Lauro Regulus., MD Admitting Physician Houston Siren, MD  Admission Diagnosis  TIA (transient ischemic attack) [G45.9] Transient cerebral ischemia, unspecified type [G45.9]  Discharge Diagnosis   Active Problems:   TIA (transient ischemic attack) History of cardiac arrhythmia Hyperlipidemia        Hospital Course patient is a 83 year old with known history of previous TIA, hypertension, hyperlipidemia presented to the hospital due to confusion and altered mental status that lasted briefly. Patient's CT scan of the head was negative. He underwent a CT angiogram is head and neck which showed some arthrosclerosis but no significant stenosis. Patient currently is asymptomatic able to ambulate well without any difficulties he is stable for discharge to home            Consults  nephrology and neurology   evlationa Ct Angio Head W Or Wo Contrast  Result Date: 11/09/2016 CLINICAL DATA:  Transient episode of confusion this morning. EXAM: CT ANGIOGRAPHY HEAD AND NECK TECHNIQUE: Multidetector CT imaging of the head and neck was performed using the standard protocol during bolus administration of intravenous contrast. Multiplanar CT image reconstructions and MIPs were obtained to evaluate the vascular anatomy. Carotid stenosis measurements (when applicable) are obtained utilizing NASCET criteria, using the distal internal carotid diameter as the denominator. CONTRAST:  75 mL Isovue 370 COMPARISON:  Noncontrast head CT earlier today FINDINGS: CTA NECK FINDINGS Aortic arch: Standard 3 vessel aortic arch. Widely patent brachiocephalic and subclavian arteries. Right carotid system: Patent without evidence of stenosis or dissection. Minimal atherosclerotic plaque at the carotid bifurcation. Left carotid system: Patent  without evidence of stenosis or dissection. Mild, predominantly calcified plaque at the carotid bifurcation and in the proximal ICA. Vertebral arteries: Patent without evidence of stenosis or dissection. Dominant right vertebral artery. Skeleton: Moderate cervical disc and facet degeneration. Grade 1 anterolisthesis of C3 on C4. Other neck: Scattered low-density subcutaneous lesions in the posterior lower neck/ upper back, likely sebaceous cysts and with the largest measuring 3.2 cm. Upper chest: Clear lung apices. Review of the MIP images confirms the above findings CTA HEAD FINDINGS Anterior circulation: The internal carotid arteries are widely patent from skullbase to carotid termini. ACAs and MCAs are patent without evidence of major branch occlusion or significant stenosis. No aneurysm. Posterior circulation: The intracranial vertebral arteries are widely patent to the basilar with the right being dominant. Patent PICA and SCA origins are visualized bilaterally. The basilar artery is widely patent. There are small bilateral posterior communicating arteries. PCAs are patent without evidence of significant stenosis. No aneurysm. Venous sinuses: Patent. Anatomic variants: None. Delayed phase: No abnormal enhancement. Review of the MIP images confirms the above findings IMPRESSION: 1. No large vessel occlusion or significant stenosis. 2. Very mild cervical carotid atherosclerosis. Electronically Signed   By: Sebastian Ache M.D.   On: 11/09/2016 14:49   Ct Angio Neck W Or Wo Contrast  Result Date: 11/09/2016 CLINICAL DATA:  Transient episode of confusion this morning. EXAM: CT ANGIOGRAPHY HEAD AND NECK TECHNIQUE: Multidetector CT imaging of the head and neck was performed using the standard protocol during bolus administration of intravenous contrast. Multiplanar CT image reconstructions and MIPs were obtained to evaluate the vascular anatomy. Carotid stenosis measurements (when applicable) are obtained utilizing  NASCET criteria, using the distal internal carotid diameter as the denominator. CONTRAST:  75 mL Isovue 370 COMPARISON:  Noncontrast head CT earlier today FINDINGS: CTA NECK FINDINGS Aortic arch: Standard 3 vessel aortic arch. Widely patent brachiocephalic and subclavian arteries. Right carotid system: Patent without evidence of stenosis or dissection. Minimal atherosclerotic plaque at the carotid bifurcation. Left carotid system: Patent without evidence of stenosis or dissection. Mild, predominantly calcified plaque at the carotid bifurcation and in the proximal ICA. Vertebral arteries: Patent without evidence of stenosis or dissection. Dominant right vertebral artery. Skeleton: Moderate cervical disc and facet degeneration. Grade 1 anterolisthesis of C3 on C4. Other neck: Scattered low-density subcutaneous lesions in the posterior lower neck/ upper back, likely sebaceous cysts and with the largest measuring 3.2 cm. Upper chest: Clear lung apices. Review of the MIP images confirms the above findings CTA HEAD FINDINGS Anterior circulation: The internal carotid arteries are widely patent from skullbase to carotid termini. ACAs and MCAs are patent without evidence of major branch occlusion or significant stenosis. No aneurysm. Posterior circulation: The intracranial vertebral arteries are widely patent to the basilar with the right being dominant. Patent PICA and SCA origins are visualized bilaterally. The basilar artery is widely patent. There are small bilateral posterior communicating arteries. PCAs are patent without evidence of significant stenosis. No aneurysm. Venous sinuses: Patent. Anatomic variants: None. Delayed phase: No abnormal enhancement. Review of the MIP images confirms the above findings IMPRESSION: 1. No large vessel occlusion or significant stenosis. 2. Very mild cervical carotid atherosclerosis. Electronically Signed   By: Sebastian Ache M.D.   On: 11/09/2016 14:49   Ct Head Code Stroke W/o  Cm  Addendum Date: 11/09/2016   ADDENDUM REPORT: 11/09/2016 11:57 ADDENDUM: Study discussed by telephone with Dr. Daryel November on 11/09/2016 at 1141 hours. Electronically Signed   By: Odessa Fleming M.D.   On: 11/09/2016 11:57   Result Date: 11/09/2016 CLINICAL DATA:  Code stroke. 83 year old male with confusion and slurred speech starting this morning. EXAM: CT HEAD WITHOUT CONTRAST TECHNIQUE: Contiguous axial images were obtained from the base of the skull through the vertex without intravenous contrast. COMPARISON:  None. FINDINGS: Brain: No midline shift, ventriculomegaly, mass effect, evidence of mass lesion, or acute intracranial hemorrhage. Minimal to mild for age nonspecific white matter hypodensity. No cortical encephalomalacia. No CT evidence of acute cortically based infarct. Vascular: No suspicious intracranial vascular hyperdensity. Skull: No acute osseous abnormality identified. Sinuses/Orbits: Clear. Other: Negative visible orbit and scalp soft tissues. ASPECTS [facility] Stroke Program Early CT Score) - Ganglionic level infarction (caudate, lentiform nuclei, internal capsule, insula, M1-M3 cortex): 7 - Supraganglionic infarction (M4-M6 cortex): 3 Total score (0-10 with 10 being normal): 10 IMPRESSION: 1. No acute cortically based infarct or acute intracranial hemorrhage identified. Mild for age nonspecific white matter changes. 2. ASPECTS is 10. Electronically Signed: By: Odessa Fleming M.D. On: 11/09/2016 11:38       Today   Subjective:   Elijah Collins patient feels well denies any symptoms  Objective:   Blood pressure (!) 116/53, pulse 64, temperature 98.3 F (36.8 C), temperature source Oral, resp. rate 18, height  (1.778 m), weight 199 lb 4.8 oz (90.4 kg), SpO2 97 %.  .  Intake/Output Summary (Last 24 hours) at 11/10/16 1434 Last data filed at 11/10/16 1018  Gross per 24 hour  Intake              240 ml  Output                0 ml  Net  240 ml    Exam VITAL SIGNS:  Blood pressure (!) 116/53, pulse 64, temperature 98.3 F (36.8 C), temperature source Oral, resp. rate 18, height  (1.778 m), weight 199 lb 4.8 oz (90.4 kg), SpO2 97 %.  GENERAL:  83 y.o.-year-old patient lying in the bed with no acute distress.  EYES: Pupils equal, round, reactive to light and accommodation. No scleral icterus. Extraocular muscles intact.  HEENT: Head atraumatic, normocephalic. Oropharynx and nasopharynx clear.  NECK:  Supple, no jugular venous distention. No thyroid enlargement, no tenderness.  LUNGS: Normal breath sounds bilaterally, no wheezing, rales,rhonchi or crepitation. No use of accessory muscles of respiration.  CARDIOVASCULAR: S1, S2 normal. No murmurs, rubs, or gallops.  ABDOMEN: Soft, nontender, nondistended. Bowel sounds present. No organomegaly or mass.  EXTREMITIES: No pedal edema, cyanosis, or clubbing.  NEUROLOGIC: Cranial nerves II through XII are intact. Muscle strength 5/5 in all extremities. Sensation intact. Gait not checked.  PSYCHIATRIC: The patient is alert and oriented x 3.  SKIN: No obvious rash, lesion, or ulcer.   Data Review     CBC w Diff: Lab Results  Component Value Date   WBC 7.5 11/09/2016   HGB 13.3 11/09/2016   HGB 12.3 (L) 07/27/2012   HCT 38.4 (L) 11/09/2016   HCT 35.4 (L) 07/27/2012   PLT 193 11/09/2016   PLT 187 07/27/2012   LYMPHOPCT 28 11/09/2016   MONOPCT 9 11/09/2016   EOSPCT 3 11/09/2016   BASOPCT 0 11/09/2016   CMP: Lab Results  Component Value Date   NA 140 11/09/2016   NA 142 07/27/2012   K 4.5 11/09/2016   K 4.3 07/27/2012   CL 106 11/09/2016   CL 107 07/27/2012   CO2 27 11/09/2016   CO2 25 07/27/2012   BUN 22 (H) 11/09/2016   BUN 25 (H) 07/27/2012   CREATININE 1.16 11/09/2016   CREATININE 1.22 07/27/2012   PROT 7.2 11/09/2016   PROT 6.9 07/27/2012   ALBUMIN 4.1 11/09/2016   ALBUMIN 3.5 07/27/2012   BILITOT 0.7 11/09/2016   BILITOT 0.4 07/27/2012   ALKPHOS 33 (L) 11/09/2016   ALKPHOS 44  (L) 07/27/2012   AST 25 11/09/2016   AST 27 07/27/2012   ALT 22 11/09/2016   ALT 23 07/27/2012  .  Micro Results No results found for this or any previous visit (from the past 240 hour(s)).      Code Status Orders        Start     Ordered   11/09/16 1734  Full code  Continuous     11/09/16 1733    Code Status History    Date Active Date Inactive Code Status Order ID Comments User Context   This patient has a current code status but no historical code status.    Advance Directive Documentation     Most Recent Value  Type of Advance Directive  Living will  Pre-existing out of facility DNR order (yellow form or pink MOST form)  -  "MOST" Form in Place?  -          Follow-up Information    Lauro Regulus., MD. Nyra Capes on 11/17/2016.   Specialty:  Internal Medicine Why:  :30 PM Contact information: [address] [facility] [place] [address] [phone]           Discharge Medications   Allergies as of 11/10/2016   No Known Allergies     Medication List    TAKE these medications  aspirin 81 MG chewable tablet Chew 81 mg by mouth at bedtime.   atorvastatin 20 MG tablet Commonly known as:  LIPITOR Take 20 mg by mouth at bedtime.   clopidogrel 75 MG tablet Commonly known as:  PLAVIX Take 75 mg by mouth daily.   metoprolol succinate 50 MG 24 hr tablet Commonly known as:  TOPROL-XL Take 50 mg by mouth daily. Take with or immediately following a meal.   MULTI-VITAMINS Tabs Take 1 tablet by mouth daily.   zolpidem 5 MG tablet Commonly known as:  AMBIEN Take 5 mg by mouth at bedtime as needed for sleep.          Total Time in preparing paper work, data evaluation and todays exam - 35 minutes  Auburn Bilberry M.D on 11/10/2016 at 2:34 PM  [facility]   Office  [phone]

## 2017-05-19 DIAGNOSIS — Z0000 Encounter for general adult medical examination without abnormal findings: Secondary | ICD-10-CM | POA: Insufficient documentation

## 2018-03-03 DEATH — deceased

## 2018-07-11 ENCOUNTER — Encounter: Payer: Self-pay | Admitting: Emergency Medicine

## 2018-07-11 ENCOUNTER — Other Ambulatory Visit: Payer: Self-pay

## 2018-07-11 ENCOUNTER — Emergency Department: Payer: Medicare Other

## 2018-07-11 ENCOUNTER — Observation Stay
Admission: EM | Admit: 2018-07-11 | Discharge: 2018-07-12 | Disposition: A | Discharge status: Home / Self Care | Payer: Medicare Other | Attending: Internal Medicine | Admitting: Internal Medicine

## 2018-07-11 DIAGNOSIS — N183 Chronic kidney disease, stage 3 (moderate): Secondary | ICD-10-CM | POA: Diagnosis not present

## 2018-07-11 DIAGNOSIS — Z7982 Long term (current) use of aspirin: Secondary | ICD-10-CM | POA: Insufficient documentation

## 2018-07-11 DIAGNOSIS — E785 Hyperlipidemia, unspecified: Secondary | ICD-10-CM | POA: Insufficient documentation

## 2018-07-11 DIAGNOSIS — R410 Disorientation, unspecified: Secondary | ICD-10-CM | POA: Insufficient documentation

## 2018-07-11 DIAGNOSIS — K219 Gastro-esophageal reflux disease without esophagitis: Secondary | ICD-10-CM | POA: Insufficient documentation

## 2018-07-11 DIAGNOSIS — J45909 Unspecified asthma, uncomplicated: Secondary | ICD-10-CM | POA: Insufficient documentation

## 2018-07-11 DIAGNOSIS — Z950 Presence of cardiac pacemaker: Secondary | ICD-10-CM | POA: Diagnosis not present

## 2018-07-11 DIAGNOSIS — Z79899 Other long term (current) drug therapy: Secondary | ICD-10-CM | POA: Insufficient documentation

## 2018-07-11 DIAGNOSIS — I498 Other specified cardiac arrhythmias: Secondary | ICD-10-CM | POA: Insufficient documentation

## 2018-07-11 DIAGNOSIS — G459 Transient cerebral ischemic attack, unspecified: Secondary | ICD-10-CM | POA: Diagnosis not present

## 2018-07-11 DIAGNOSIS — I129 Hypertensive chronic kidney disease with stage 1 through stage 4 chronic kidney disease, or unspecified chronic kidney disease: Secondary | ICD-10-CM | POA: Diagnosis not present

## 2018-07-11 DIAGNOSIS — Z7902 Long term (current) use of antithrombotics/antiplatelets: Secondary | ICD-10-CM | POA: Diagnosis not present

## 2018-07-11 LAB — COMPREHENSIVE METABOLIC PANEL
ALT: 20 U/L (ref 0–44)
AST: 22 U/L (ref 15–41)
Albumin: 4 g/dL (ref 3.5–5.0)
Alkaline Phosphatase: 30 U/L — ABNORMAL LOW (ref 38–126)
Anion gap: 8 (ref 5–15)
BUN: 26 mg/dL — AB (ref 8–23)
CHLORIDE: 105 mmol/L (ref 98–111)
CO2: 26 mmol/L (ref 22–32)
Calcium: 9.4 mg/dL (ref 8.9–10.3)
Creatinine, Ser: 1.19 mg/dL (ref 0.61–1.24)
GFR calc Af Amer: >60 mL/min (ref >60)
GFR calc non Af Amer: 53 mL/min — ABNORMAL LOW (ref >60)
Glucose, Bld: 79 mg/dL (ref 70–99)
POTASSIUM: 4 mmol/L (ref 3.5–5.1)
SODIUM: 139 mmol/L (ref 135–145)
Total Bilirubin: 0.9 mg/dL (ref 0.3–1.2)
Total Protein: 7.3 g/dL (ref 6.5–8.1)

## 2018-07-11 LAB — CBC
HEMATOCRIT: 37.9 % — AB (ref 39.0–52.0)
Hemoglobin: 12.5 g/dL — ABNORMAL LOW (ref 13.0–17.0)
MCH: 32.8 pg (ref 26.0–34.0)
MCHC: 33 g/dL (ref 30.0–36.0)
MCV: 99.5 fL (ref 80.0–100.0)
NRBC: 0 % (ref 0.0–0.2)
Platelets: 215 10*3/uL (ref 150–400)
RBC: 3.81 MIL/uL — ABNORMAL LOW (ref 4.22–5.81)
RDW: 13 % (ref 11.5–15.5)
WBC: 6.8 10*3/uL (ref 4.0–10.5)

## 2018-07-11 LAB — TROPONIN I: Troponin I: <0.03 ng/mL (ref <0.03)

## 2018-07-11 MED ORDER — ZOLPIDEM TARTRATE 5 MG PO TABS
5.0000 mg | ORAL_TABLET | Freq: Every evening | ORAL | Status: DC | PRN
  Administered 2018-07-11: 22:00:00 5 mg via ORAL
  Filled 2018-07-11: qty 1

## 2018-07-11 MED ORDER — CLOPIDOGREL BISULFATE 75 MG PO TABS
75.0000 mg | ORAL_TABLET | Freq: Every day | ORAL | Status: DC
  Administered 2018-07-12: 75 mg via ORAL
  Filled 2018-07-11: qty 1

## 2018-07-11 MED ORDER — ENOXAPARIN SODIUM 40 MG/0.4ML ~~LOC~~ SOLN
40.0000 mg | SUBCUTANEOUS | Status: DC
  Administered 2018-07-11: 40 mg via SUBCUTANEOUS
  Filled 2018-07-11: qty 0.4

## 2018-07-11 MED ORDER — ATORVASTATIN CALCIUM 20 MG PO TABS
20.0000 mg | ORAL_TABLET | Freq: Every day | ORAL | Status: DC
  Administered 2018-07-11: 20 mg via ORAL
  Filled 2018-07-11: qty 1

## 2018-07-11 MED ORDER — METOPROLOL SUCCINATE ER 50 MG PO TB24: 50.0000 mg | ORAL_TABLET | Freq: Every day | ORAL | Status: DC

## 2018-07-11 MED ORDER — ASPIRIN 81 MG PO CHEW
81.0000 mg | CHEWABLE_TABLET | Freq: Every day | ORAL | Status: DC
  Administered 2018-07-11: 22:00:00 81 mg via ORAL
  Filled 2018-07-11: qty 1

## 2018-07-11 MED ORDER — STROKE: EARLY STAGES OF RECOVERY BOOK
Freq: Once | Status: AC
  Administered 2018-07-11: 16:00:00

## 2018-07-11 NOTE — Consult Note (Addendum)
Referring Physician: Paduchoski    Chief Complaint: Confusion  HPI: Elijah GAULIN Sr. is an 84 y.o. male with past medical history of TIA, chronic kidney disease stage III, hyperlipidemia, bradycardia status post pacemaker, GERD, and asthma presenting to the ED with chief complaints of confusion.  Patient is a good historian and is able to provide his  on history today.  States that he went out for breakfast with friends and came back home at around 9:30 am and could not make a turn towards his house. He made several attempts prior to turing in his driveway. Patient state that  he went to his room and tried to log into his computer but could not remember how to do it which was very unusual for him. Patient state that he tried searching the Internet but also had difficulty.  He could not recall what medications he was taking and why he was taking them. Patient went to the kitchen and told his aide that he felt very confused and may be having a stroke. Denied  speech abnormality, cranial nerve deficit,  focal motor or sensory deficits, seizure like activity, blank staring, dizziness, headache, diplopia, nausea or vomiting, syncope or LOC, paresthesia (numbness, tingling, pins-and-needles sensation) or a heavy feeling in an extremity. He called his daughter who lives in [place] who in turn told him to call 911.  On arrival to the ED symptoms had resolved.  Initial NIH stroke scale 0.  Initial  CT head showed no acute intracranial abnormality.  Date last known well: Date: 07/11/2018 Time last known well: Time: 09:30 tPA Given: No: Initial NIH stroke scale 0 and resolved symptoms.  Past Medical History:  Diagnosis Date  . Asthma   . Cardiac arrhythmia   . TIA (transient ischemic attack)     Past Surgical History:  Procedure Laterality Date  . PACEMAKER IMPLANT      Family History  Problem Relation Age of Onset  . CVA Mother   . COPD Father    Medical History Relation Name Comments  Diabetes  type II Brother    Obesity Brother    Emphysema Father    Stroke Mother    Stroke Sister      Social History:  reports that he has never smoked. He has never used smokeless tobacco. He reports that he does not drink alcohol or use drugs.  Allergies: No Known Allergies  Medications: I have reviewed the patient's current medications. Prior to Admission: Prior to Admission medications   Medication Sig Start Date End Date Taking? Authorizing Provider  atorvastatin (LIPITOR) 20 MG tablet Take 20 mg by mouth at bedtime.   Yes [provider]  clopidogrel (PLAVIX) 75 MG tablet Take 75 mg by mouth daily.   Yes [provider]  aspirin 81 MG chewable tablet Chew 81 mg by mouth at bedtime.    [provider]  metoprolol succinate (TOPROL-XL) 50 MG 24 hr tablet Take 50 mg by mouth daily. Take with or immediately following a meal.    [provider]  Multiple Vitamin (MULTI-VITAMINS) TABS Take 1 tablet by mouth daily.    [provider]  zolpidem (AMBIEN) 5 MG tablet Take 5 mg by mouth at bedtime as needed for sleep.     [provider]     (Not in a hospital admission) Scheduled:  ROS: History obtained from the patient   General ROS: negative for - chills, fatigue, fever, night sweats, weight gain or weight loss Psychological ROS:  negative for - behavioral disorder, hallucinations, memory difficulties, mood swings or suicidal ideation Ophthalmic ROS: negative for - blurry vision, double vision, eye pain or loss of vision ENT ROS: negative for - epistaxis, nasal discharge, oral lesions, sore throat, tinnitus or vertigo Allergy and Immunology ROS: negative for - hives or itchy/watery eyes Hematological and Lymphatic ROS: negative for - bleeding problems, bruising or swollen lymph nodes Endocrine ROS: negative for - galactorrhea, hair pattern changes, polydipsia/polyuria or temperature intolerance Respiratory ROS: negative for -  cough, hemoptysis, shortness of breath or wheezing Cardiovascular ROS: negative for - chest pain, dyspnea on exertion, edema or irregular heartbeat Gastrointestinal ROS: negative for - abdominal pain, diarrhea, hematemesis, nausea/vomiting or stool incontinence Genito-Urinary ROS: negative for - dysuria, hematuria, incontinence or urinary frequency/urgency Musculoskeletal ROS: negative for - joint swelling or muscular weakness Neurological ROS: as noted in HPI Dermatological ROS: negative for rash and skin lesion changes  Physical Examination: Blood pressure 118/71, pulse 63, temperature (!) 97.5 F (36.4 C), resp. rate 18, height 5\' 10"  (1.778 m), weight 86.2 kg, SpO2 98 %.   HEENT-  Normocephalic, no lesions, without obvious abnormality.  Normal external eye and conjunctiva.  Normal TM's bilaterally.  Normal auditory canals and external ears. Normal external nose, mucus membranes and septum.  Normal pharynx. Cardiovascular- S1, S2 normal, pulses palpable throughout   Lungs- chest clear, no wheezing, rales, normal symmetric air entry Abdomen- soft, non-tender; bowel sounds normal; no masses,  no organomegaly Extremities- no edema Lymph-no adenopathy palpable Musculoskeletal-no joint tenderness, deformity or swelling Skin-warm and dry, no hyperpigmentation, vitiligo, or suspicious lesions  Neurological Exam   Mental Status: Alert, oriented, thought content appropriate.  Speech fluent without evidence of aphasia.  Able to follow 3 step commands without difficulty. Attention span and concentration seemed appropriate  Cranial Nerves: II: Discs flat bilaterally; Visual fields grossly normal, pupils equal, round, reactive to light and accommodation III,IV, VI: ptosis not present, extra-ocular motions intact bilaterally V,VII: smile symmetric, facial light touch sensation intact VIII: hearing normal bilaterally IX,X: gag reflex present XI: bilateral shoulder shrug XII: midline tongue  extension Motor: Right :  Upper extremity   5/5 Without pronator drift      Left: Upper extremity   5/5 without pronator drift Right:   Lower extremity   5/5                                          Left: Lower extremity   5/5 Tone and bulk:normal tone throughout; no atrophy noted Sensory: Pinprick and light touch intact bilaterally Deep Tendon Reflexes: 2+ and symmetric throughout Plantars: Right:  Downgoing                              Left: Downgoing  Cerebellar: Finger-to-nose testing intact bilaterally. Heel to shin testing normal bilaterally Gait: not tested due to safety concerns  Data Reviewed  Laboratory Studies:  Basic Metabolic Panel: Recent Labs  Lab 07/11/18 1047  NA 139  K 4.0  CL 105  CO2 26  GLUCOSE 79  BUN 26*  CREATININE 1.19  CALCIUM 9.4    Liver Function Tests: Recent Labs  Lab 07/11/18 1047  AST 22  ALT 20  ALKPHOS 30*  BILITOT 0.9  PROT 7.3  ALBUMIN 4.0   No results for input(s): LIPASE, AMYLASE in the last 168 hours.  No results for input(s): AMMONIA in the last 168 hours.  CBC: Recent Labs  Lab 07/11/18 1047  WBC 6.8  HGB 12.5*  HCT 37.9*  MCV 99.5  PLT 215    Cardiac Enzymes: Recent Labs  Lab 07/11/18 1047  TROPONINI <0.03    BNP: Invalid input(s): POCBNP  CBG: No results for input(s): GLUCAP in the last 168 hours.  Microbiology: No results found for this or any previous visit.  Coagulation Studies: No results for input(s): LABPROT, INR in the last 72 hours.  Urinalysis: No results for input(s): COLORURINE, LABSPEC, PHURINE, GLUCOSEU, HGBUR, BILIRUBINUR, KETONESUR, PROTEINUR, UROBILINOGEN, NITRITE, LEUKOCYTESUR in the last 168 hours.  Invalid input(s): APPERANCEUR  Lipid Panel: No results found for: CHOL, TRIG, HDL, CHOLHDL, VLDL, LDLCALC  HgbA1C: No results found for: HGBA1C  Urine Drug Screen:  No results found for: LABOPIA, COCAINSCRNUR, LABBENZ, AMPHETMU, THCU, LABBARB  Alcohol Level: No results for  input(s): ETH in the last 168 hours.  Other results: EKG: unchanged from previous tracings, Atrial paced complexes Vent. rate 63 BPM PR interval * ms QRS duration 112 ms QT/QTc 412/422 ms P-R-T axes * 65 61  Imaging: Ct Head Wo Contrast  Result Date: 07/11/2018 CLINICAL DATA:  Pt to ER via EMS from home states he went out to breakfast this AM and when he arrived home was unable to remember how to log on to the computer. On EMS arrival pt was feeling better, but while EMS was conducting neuro exam, again had forgetfulness. PT A&O x 4 at this time. Pt has hx of TIA last year. EXAM: CT HEAD WITHOUT CONTRAST TECHNIQUE: Contiguous axial images were obtained from the base of the skull through the vertex without intravenous contrast. COMPARISON:  11/09/2016 FINDINGS: Brain: No evidence of acute infarction, hemorrhage, hydrocephalus, extra-axial collection or mass lesion/mass effect. Vascular: There is ventricular sulcal enlargement reflecting mild generalized atrophy. Patchy periventricular white matter hypoattenuation is also present consistent with mild chronic microvascular ischemic change. Skull: Normal. Negative for fracture or focal lesion. Sinuses/Orbits: Visualized globes and orbits are unremarkable. The visualized sinuses and mastoid air cells are clear. Other: None. IMPRESSION: 1. No acute intracranial abnormalities. 2. Mild generalized atrophy and chronic microvascular ischemic change, stable from the prior head CT. Electronically Signed   By: Amie Portlandavid  Ormond M.D.   On: 07/11/2018 11:03   Patient seen and examined.  Clinical course and management discussed.  Necessary edits performed.  I agree with the above.  Assessment and plan of care developed and discussed below.    Assessment: 84 y.o. male with past medical history of TIA, chronic kidney disease stage III, hyperlipidemia, bradycardia status post pacemaker, GERD, and asthma presenting to the ED with chief complaints of confusion.  Symptoms  now resolved. Concerns for TIA.  CT head reviewed and showed no acute intracranial abnormality.  Patient unable to obtain MRI of the brain due to pacer.  Patient state he was taking Plavix 75 mg once a day prior to this event but does have some confusion with his medications.  Daughter will be checking his home medications. Patient has had multiple similar events previously.  All previous work up have been unremarkable.    Stroke Risk Factors - family history and hyperlipidemia  Plan: 1. HgbA1c, fasting lipid panel 2. Prophylactic therapy- Start dual therapy Aspirin 81 mg/day and Plavix 75 mg /day  3. PT consult, OT consult, Speech consult 4. Echocardiogram 5. Carotid dopplers 6. NPO until RN stroke swallow screen 7. Telemetry monitoring 8. Frequent  neuro checks 9. If above work up unrevealing would consider prolonged cardiac monitoring on an outpatient basis.    This patient was staffed with Dr. Verlon Au, Thad Ranger who personally evaluated patient, reviewed documentation and agreed with assessment and plan of care as above.  Webb Silversmith, DNP, FNP-BC Board certified Nurse Practitioner Neurology Department  07/11/2018, 11:45 AM  Thana Farr, MD Neurology [phone]  07/11/2018  1:45 PM

## 2018-07-11 NOTE — ED Provider Notes (Signed)
[facility] Emergency Department Provider Note  Time seen: 10:45 AM  I have reviewed the triage vital signs and the nursing notes.   HISTORY  Chief Complaint Neurologic Problem    HPI Elijah Collins. is a 84 y.o. male with a past medical history of asthma, pacemaker, TIA, presents to the emergency department after an episode of confusion.  According to the patient he ate breakfast this morning at a restaurant, he went home to get on the computer and forgot how to log onto the computer, forgot why he was getting on the computer, could not figure out how to access the computer.  Patient states this is something he does on a daily basis.  Became concerned and called 911.  EMS states upon arrival to the house the patient states he felt much better, states he felt like his memory was intact.  Upon arrival to the emergency department patient states he feels well denies any difficulty speaking at any point.  Denies any weakness or numbness of any arm or leg at any point.   Past Medical History:  Diagnosis Date  . Asthma   . Cardiac arrhythmia   . TIA (transient ischemic attack)     Patient Active Problem List   Diagnosis Date Noted  . TIA (transient ischemic attack) 11/09/2016    Past Surgical History:  Procedure Laterality Date  . PACEMAKER IMPLANT      Prior to Admission medications   Medication Sig Start Date End Date Taking? Authorizing Provider  aspirin 81 MG chewable tablet Chew 81 mg by mouth at bedtime.    [provider]  atorvastatin (LIPITOR) 20 MG tablet Take 20 mg by mouth at bedtime.    [provider]  clopidogrel (PLAVIX) 75 MG tablet Take 75 mg by mouth daily.    [provider]  metoprolol succinate (TOPROL-XL) 50 MG 24 hr tablet Take 50 mg by mouth daily. Take with or immediately following a meal.    [provider]  Multiple Vitamin (MULTI-VITAMINS) TABS Take 1 tablet by mouth daily.    [provider]  zolpidem (AMBIEN) 5 MG tablet Take 5 mg by mouth at bedtime as needed for sleep.     [provider]    No Known Allergies  Family History  Problem Relation Age of Onset  . CVA Mother   . COPD Father     Social History Social History   Tobacco Use  . Smoking status: Never Smoker  . Smokeless tobacco: Never Used  Substance Use Topics  . Alcohol use: No  . Drug use: No    Review of Systems Constitutional: Negative for fever. Cardiovascular: Negative for chest pain. Respiratory: Negative for shortness of breath. Gastrointestinal: Negative for abdominal pain, vomiting Musculoskeletal: Negative for musculoskeletal complaints Skin: Negative for skin complaints  Neurological: Negative for headache.  Positive for memory deficit now resolved. All other ROS negative  ____________________________________________   PHYSICAL EXAM:  VITAL SIGNS: ED Triage Vitals  Enc Vitals Group     BP 07/11/18 1033 134/68     Pulse Rate 07/11/18 1033 63     Resp 07/11/18 1033 18     Temp 07/11/18 1033 (!) 97.5 F (36.4 C)     Temp src --      SpO2 07/11/18 1033 98 %     Weight 07/11/18 1036 190 lb (86.2 kg)     Height 07/11/18 1036 5\' 10"  (1.778 m)     Head  Circumference --      Peak Flow --      Pain Score 07/11/18 1035 0     Pain Loc --      Pain Edu? --      Excl. in GC? --    Constitutional: Alert and oriented. Well appearing and in no distress. Eyes: Normal exam ENT   Head: Normocephalic and atraumatic.   Mouth/Throat: Mucous membranes are moist. Cardiovascular: Normal rate, regular rhythm.  Respiratory: Normal respiratory effort without tachypnea nor retractions. Breath sounds are clear Gastrointestinal: Soft and nontender. No distention.   Musculoskeletal: Nontender with normal range of motion in all extremities. Neurologic:  Normal speech and language. No gross focal neurologic deficits.  Equal grip strength bilaterally.  No pronator drift.   No lower extremity drift.  Cranial nerves intact. Skin:  Skin is warm, dry and intact.  Psychiatric: Mood and affect are normal. Speech and behavior are normal.   ____________________________________________    EKG  EKG viewed and interpreted by myself shows an atrial paced rhythm at 63 bpm with a narrow QRS, normal axis, normal intervals, no concerning ST changes.  ____________________________________________    RADIOLOGY  CT head negative  ____________________________________________   INITIAL IMPRESSION / ASSESSMENT AND PLAN / ED COURSE  Pertinent labs & imaging results that were available during my care of the patient were reviewed by me and considered in my medical decision making (see chart for details).  Patient presents to the emergency department for memory deficit which is since resolved.  Patient states he logs onto the computer nearly every day, states after breakfast this morning he could not figure out how to log onto the computer cannot remember why he was at the computer what he was doing.  States a similar episode 1 year ago when he was diagnosed with a TIA.  Denies any weakness or numbness at any point.  Denies any trouble speaking.  Denies any headache.  Patient is currently alert and oriented x4 with a normal neurological exam.  NIH stroke scale of 0 currently.  We will check labs, CT scan of the head and continue to closely monitor.  CT is negative.  Labs are largely within normal limits.  I discussed the patient with neurology who recommends admission to the hospital for TIA work-up.  Patient agreeable to plan of care.  ____________________________________________   FINAL CLINICAL IMPRESSION(S) / ED DIAGNOSES  TIA    Minna AntisPaduchowski, Jaeli Grubb, MD 07/11/18 1230

## 2018-07-11 NOTE — H&P (Addendum)
[facility] [place] at [facility]   PATIENT NAME: Elijah Collins    MR#:  [id number]  DATE OF BIRTH:  11-Apr-1925  DATE OF ADMISSION:  07/11/2018  PRIMARY CARE PHYSICIAN: Lauro Regulus, MD   REQUESTING/REFERRING PHYSICIAN: Dr. Minna Antis  CHIEF COMPLAINT: Confusion   Chief Complaint  Patient presents with  . Neurologic Problem    HISTORY OF PRESENT ILLNESS:  Elijah Collins  is a 84 y.o. male with a known history of essential hypertension, pacemaker, history of TIA came in because of confusion.  Patient ate breakfast this morning and went home and he forgot on how to log onto the computer and how to use.  Patient is very active and independent and he states that he locks on the computer on a daily basis and then he got confused and unable to do so this morning.  So concerning this he called 911.  Upon arrival of EMS patient stated that he feels well and denies any difficulty speaking.  Did not have any weakness of hands, legs.  But patient noted to have some confusion and unable to use computer.  Patient was a retired Comptroller, who is pretty independent of activities of daily living still drives and also routinely logs  onto the computer, which he could not do this morning  PAST MEDICAL HISTORY:   Past Medical History:  Diagnosis Date  . Asthma   . Cardiac arrhythmia   . TIA (transient ischemic attack)     PAST SURGICAL HISTOIRY:   Past Surgical History:  Procedure Laterality Date  . PACEMAKER IMPLANT      SOCIAL HISTORY:   Social History   Tobacco Use  . Smoking status: Never Smoker  . Smokeless tobacco: Never Used  Substance Use Topics  . Alcohol use: No    FAMILY HISTORY:   Family History  Problem Relation Age of Onset  . CVA Mother   . COPD Father     DRUG ALLERGIES:  No Known Allergies  REVIEW OF SYSTEMS:  CONSTITUTIONAL: No fever, fatigue or weakness.  EYES: No blurred or double vision.  EARS, NOSE, AND  THROAT: No tinnitus or ear pain.  RESPIRATORY: No cough, shortness of breath, wheezing or hemoptysis.  CARDIOVASCULAR: No chest pain, orthopnea, edema.  GASTROINTESTINAL: No nausea, vomiting, diarrhea or abdominal pain.  GENITOURINARY: No dysuria, hematuria.  ENDOCRINE: No polyuria, nocturia,  HEMATOLOGY: No anemia, easy bruising or bleeding SKIN: No rash or lesion. MUSCULOSKELETAL: No joint pain or arthritis.   NEUROLOGIC: No tingling, numbness, weakness.  PSYCHIATRY: No anxiety or depression.   MEDICATIONS AT HOME:   Prior to Admission medications   Medication Sig Start Date End Date Taking? Authorizing Provider  atorvastatin (LIPITOR) 20 MG tablet Take 20 mg by mouth at bedtime.   Yes [provider]  clopidogrel (PLAVIX) 75 MG tablet Take 75 mg by mouth daily.   Yes [provider]  aspirin 81 MG chewable tablet Chew 81 mg by mouth at bedtime.    [provider]  metoprolol succinate (TOPROL-XL) 50 MG 24 hr tablet Take 50 mg by mouth daily. Take with or immediately following a meal.    [provider]  Multiple Vitamin (MULTI-VITAMINS) TABS Take 1 tablet by mouth daily.    [provider]  zolpidem (AMBIEN) 5 MG tablet Take 5 mg by mouth at bedtime as needed for sleep.     [provider]      VITAL SIGNS:  Blood pressure  132/68, pulse 62, temperature (!) 97.5 F (36.4 C), resp. rate 14, height 5\' 10"  (1.778 m), weight 86.2 kg, SpO2 96 %.  PHYSICAL EXAMINATION:  GENERAL:  84 y.o.-year-old patient lying in the bed with no acute distress.  EYES: Pupils equal, round, reactive to light and accommodation. No scleral icterus. Extraocular muscles intact.  HEENT: Head atraumatic, normocephalic. Oropharynx and nasopharynx clear.  NECK:  Supple, no jugular venous distention. No thyroid enlargement, no tenderness.  LUNGS: Normal breath sounds bilaterally, no wheezing, rales,rhonchi or crepitation. No use of accessory muscles of  respiration.  CARDIOVASCULAR: S1, S2 normal. No murmurs, rubs, or gallops.  ABDOMEN: Soft, nontender, nondistended. Bowel sounds present. No organomegaly or mass.  EXTREMITIES: No pedal edema, cyanosis, or clubbing.  NEUROLOGIC: Cranial nerves II through XII are intact. Muscle strength 5/5 in all extremities. Sensation intact. Gait not checked.  PSYCHIATRIC: The patient is alert and oriented x 3.  SKIN: No obvious rash, lesion, or ulcer.   LABORATORY PANEL:   CBC Recent Labs  Lab 07/11/18 1047  WBC 6.8  HGB 12.5*  HCT 37.9*  PLT 215   ------------------------------------------------------------------------------------------------------------------  Chemistries  Recent Labs  Lab 07/11/18 1047  NA 139  K 4.0  CL 105  CO2 26  GLUCOSE 79  BUN 26*  CREATININE 1.19  CALCIUM 9.4  AST 22  ALT 20  ALKPHOS 30*  BILITOT 0.9   ------------------------------------------------------------------------------------------------------------------  Cardiac Enzymes Recent Labs  Lab 07/11/18 1047  TROPONINI <0.03   ------------------------------------------------------------------------------------------------------------------  RADIOLOGY:  Ct Head Wo Contrast  Result Date: 07/11/2018 CLINICAL DATA:  Pt to ER via EMS from home states he went out to breakfast this AM and when he arrived home was unable to remember how to log on to the computer. On EMS arrival pt was feeling better, but while EMS was conducting neuro exam, again had forgetfulness. PT A&O x 4 at this time. Pt has hx of TIA last year. EXAM: CT HEAD WITHOUT CONTRAST TECHNIQUE: Contiguous axial images were obtained from the base of the skull through the vertex without intravenous contrast. COMPARISON:  11/09/2016 FINDINGS: Brain: No evidence of acute infarction, hemorrhage, hydrocephalus, extra-axial collection or mass lesion/mass effect. Vascular: There is ventricular sulcal enlargement reflecting mild generalized atrophy.  Patchy periventricular white matter hypoattenuation is also present consistent with mild chronic microvascular ischemic change. Skull: Normal. Negative for fracture or focal lesion. Sinuses/Orbits: Visualized globes and orbits are unremarkable. The visualized sinuses and mastoid air cells are clear. Other: None. IMPRESSION: 1. No acute intracranial abnormalities. 2. Mild generalized atrophy and chronic microvascular ischemic change, stable from the prior head CT. Electronically Signed   By: Amie Portland M.D.   On: 07/11/2018 11:03    EKG:   Orders placed or performed during the hospital encounter of 07/11/18  . EKG 12-Lead  . EKG 12-Lead   EKG shows atrial paced rhythm at 63 bpm, no ST-T changes. IMPRESSION AND PLAN:   83 year old male patient with essential hypertension, hyperlipidemia, history of CKD stage III, bradycardia status post permanent pacemaker placement comes in this morning because of memory loss.  Initial NIH stroke scale of 0.  Patient CT head unremarkable.  Seen by neurology recommended overnight admission for TIA work-up.  1.-Transient memory loss: Evaluate for TIA, initial CT unremarkable.  MRI of the brain cannot be done because of pacemaker.  Neurology recommended checking hemoglobin A1c, fasting lipids, echocardiogram, carotid ultrasound, PT, OT consult and recommended to start dual antiplatelet therapy with aspirin, Plavix.  Continue frequent neuro checks.   #  2.  GERD: Continue PPIs 3.  Hyperlipidemia: Continue statins History of TIA, patient is on Plavix.  h/o bradycardia status post pacemaker placement, patient does not take metoprolol anymore.  Discontinue metoprolol.   All the records are reviewed and case discussed with ED provider. Management plans discussed with the patient, family and they are in agreement.  CODE STATUS: full  TOTAL TIME TAKING CARE OF THIS PATIENT: 55 minutes.    Katha HammingSnehalatha Harles Evetts M.D on 07/11/2018 at 2:41 PM  Between 7am to 6pm -  Pager - [phone]  After 6pm go to [email or web address] - password [email or web address] [facility]  [facility]  Office  [phone]  CC: Primary care physician; Lauro RegulusAnderson, Marshall W, MD  Note: This dictation was prepared with Dragon dictation along with smaller phrase technology. Any transcriptional errors that result from this process are unintentional.

## 2018-07-11 NOTE — ED Triage Notes (Signed)
Pt to ER via EMS from home states he went out to breakfast this AM and when he arrived home was unable to remember how to log on to the computer.  On EMS arrival pt was feeling better, but while EMS was conducting neuro exam, again had forgetfulness.  PT A&O x 4 at this time.  Pt has hx of TIA last year.

## 2018-07-12 ENCOUNTER — Observation Stay
Admit: 2018-07-12 | Discharge: 2018-07-12 | Disposition: A | Discharge status: Home / Self Care | Payer: Medicare Other | Attending: Internal Medicine | Admitting: Internal Medicine

## 2018-07-12 ENCOUNTER — Observation Stay: Payer: Medicare Other

## 2018-07-12 LAB — LIPID PANEL
Cholesterol: 128 mg/dL (ref 0–200)
HDL: 42 mg/dL (ref >40)
LDL Cholesterol: 75 mg/dL (ref 0–99)
TRIGLYCERIDES: 55 mg/dL (ref <150)
Total CHOL/HDL Ratio: 3 RATIO
VLDL: 11 mg/dL (ref 0–40)

## 2018-07-12 LAB — ECHOCARDIOGRAM COMPLETE
Height: 70 in
Weight: 3040 oz

## 2018-07-12 LAB — HEMOGLOBIN A1C
Hgb A1c MFr Bld: 5.9 % — ABNORMAL HIGH (ref 4.8–5.6)
Mean Plasma Glucose: 122.63 mg/dL

## 2018-07-12 NOTE — Evaluation (Signed)
Occupational Therapy Evaluation Patient Details Name: Elijah MutaJack L Tabron Sr. MRN: [id number] DOB: 06/29/1925 Today's Date: 07/12/2018    History of Present Illness presented to ER secondary to acute onset of confusion, AMS (difficulty logging into computer); admitted for TIA/CVA work up.  Head CT negative for acute infarct; MRI unable due to PPM.   Clinical Impression   Pt seen for OT evaluation this date. Prior to hospital admission, pt was independent in all aspects of ADL/IADL, mobility, and denies falls history in past 12 months.  Currently pt reporting symptoms have resolved. Pt demonstrates baseline independence to perform ADL and mobility tasks and no strength, sensory, coordination, cognitive, or visual deficits appreciated with assessment. No skilled OT needs identified. Will sign off. Please re-consult if additional OT needs arise.    Follow Up Recommendations  No OT follow up    Equipment Recommendations  None recommended by OT    Recommendations for Other Services       Precautions / Restrictions Precautions Precautions: Fall;ICD/Pacemaker Restrictions Weight Bearing Restrictions: No      Mobility Bed Mobility Overal bed mobility: Independent                Transfers Overall transfer level: Modified independent Equipment used: None             General transfer comment: good LE strength and power with movement transition    Balance Overall balance assessment: Needs assistance Sitting-balance support: No upper extremity supported;Feet supported Sitting balance-Leahy Scale: Good     Standing balance support: No upper extremity supported Standing balance-Leahy Scale: Good                 High Level Balance Comments:  pt reached out to grasp tray when standing           ADL either performed or assessed with clinical judgement   ADL Overall ADL's : Modified independent                                       General ADL  Comments: Pt states he feels at baseline     Vision Baseline Vision/History: Wears glasses Wears Glasses: At all times Patient Visual Report: No change from baseline       Perception     Praxis      Pertinent Vitals/Pain Pain Assessment: No/denies pain     Hand Dominance     Extremity/Trunk Assessment Upper Extremity Assessment Upper Extremity Assessment: Overall WFL for tasks assessed(BUE grossly 5/5 strength)   Lower Extremity Assessment Lower Extremity Assessment: Overall WFL for tasks assessed(BLE grossly 5/5)       Communication Communication Communication: No difficulties   Cognition Arousal/Alertness: Awake/alert Behavior During Therapy: WFL for tasks assessed/performed Overall Cognitive Status: Within Functional Limits for tasks assessed                                     General Comments  Supine BP 126/60; EOB 112/73, standing 117/62    Exercises     Shoulder Instructions      Home Living Family/patient expects to be discharged to:: Private residence Living Arrangements: Spouse/significant other Available Help at Discharge: Family Type of Home: House Home Access: Stairs to enter;Ramped entrance Entrance Stairs-Number of Steps: 3 Entrance Stairs-Rails: Right;Left Home Layout: One level;Laundry or work area in basement  Bathroom Shower/Tub: Producer, television/film/video: Handicapped height     Home Equipment: None          Prior Functioning/Environment Level of Independence: Independent        Comments: Indep with ADLs, household and community mobilization without assist device; + driving; assumes all household responsibilities and community errands indep; denies fall history.        OT Problem List: Impaired balance (sitting and/or standing)      OT Treatment/Interventions:      OT Goals(Current goals can be found in the care plan section) Acute Rehab OT Goals Patient Stated Goal: to get out of here  soon OT Goal Formulation: All assessment and education complete, DC therapy  OT Frequency:     Barriers to D/C:            Co-evaluation              AM-PAC OT "6 Clicks" Daily Activity     Outcome Measure Help from another person eating meals?: None Help from another person taking care of personal grooming?: None Help from another person toileting, which includes using toliet, bedpan, or urinal?: None Help from another person bathing (including washing, rinsing, drying)?: None Help from another person to put on and taking off regular upper body clothing?: None Help from another person to put on and taking off regular lower body clothing?: None 6 Click Score: 24   End of Session Equipment Utilized During Treatment: Gait belt  Activity Tolerance: Patient tolerated treatment well Patient left: in bed;with call bell/phone within reach;with bed alarm set  OT Visit Diagnosis: Other abnormalities of gait and mobility (R26.89)                Time: 1191-4782 OT Time Calculation (min): 24 min Charges:     Gypsy Balsam OTS  07/12/2018, 9:52 AM

## 2018-07-12 NOTE — Care Management Obs Status (Signed)
MEDICARE OBSERVATION STATUS NOTIFICATION   Patient Details  Name: Elijah MutaJack L Verrastro Sr. MRN: [id number] Date of Birth: 02/07/1925   Medicare Observation Status Notification Given:  No  Discharge order placed in < 24hr of being placed on observation  Eber HongGreene, Norberto Wishon R, RN 07/12/2018, 10:29 AM

## 2018-07-12 NOTE — Progress Notes (Signed)
Pt D/C to home with wife. IV removed intact. Education completed. All belongings sent with pt. VSS

## 2018-07-12 NOTE — Evaluation (Signed)
Physical Therapy Evaluation Patient Details Name: Elijah WEATHERHOLTZ Sr. MRN: [id number] DOB: 11-21-1924 Today's Date: 07/12/2018   History of Present Illness  presented to ER secondary to acute onset of confusion, AMS (difficulty logging into computer); admitted for TIA/CVA work up.  Head CT negative for acute infarct; MRI unable due to PPM.  Clinical Impression  Upon evaluation, patient alert and oriented; follows commands and demonstrates good effort with all mobility tasks.  Bilat UE/LE strength and ROM grossly symmetrical without focal weakness, sensory or coordination deficit noted.  Able to complete bed mobility with indep; sit/stand, basic transfers and gait (200') without assist device, close sup/mod indep. Fair/good gait mechanics, cadence (10' walk time, 6-7 seconds); very mild gait deviations with dynamic gait components (head turns, stepping over obstacles), but no overt buckling or LOB.  Good use of LE step strategy and appropriate compensatory/righing reactions. Patient/family indicate performance is at/near baseline; comfortable with current performance and upcoming discharge.  No acute PT needs identified at this time.  Will complete order; please re-consult should needs change.    Follow Up Recommendations No PT follow up    Equipment Recommendations       Recommendations for Other Services       Precautions / Restrictions Precautions Precautions: Fall;ICD/Pacemaker Restrictions Weight Bearing Restrictions: No      Mobility  Bed Mobility Overal bed mobility: Independent                Transfers Overall transfer level: Modified independent Equipment used: None             General transfer comment: good LE strength and power with movement transition  Ambulation/Gait Ambulation/Gait assistance: Supervision;Modified independent (Device/Increase time) Gait Distance (Feet): 200 Feet Assistive device: None   Gait velocity: 10' walk time, 6-7 seconds    General Gait Details: reciprocal stepping pattern with good step height/length, fair cadence/gait speed; mild decrease in trunk rotation and R > L arm swing.  Completes dynamic gait components with only mild gait deviations (that patient self-corrects with LE step strategy).  Patient/daughter feel performance is at/near baseline for him.  Stairs            Wheelchair Mobility    Modified Rankin (Stroke Patients Only)       Balance Overall balance assessment: Needs assistance Sitting-balance support: No upper extremity supported;Feet supported Sitting balance-Leahy Scale: Good     Standing balance support: No upper extremity supported Standing balance-Leahy Scale: Good                               Pertinent Vitals/Pain Pain Assessment: No/denies pain    Home Living Family/patient expects to be discharged to:: Private residence Living Arrangements: Spouse/significant other(wife under hospice care) Available Help at Discharge: Family Type of Home: House Home Access: Stairs to enter;Ramped entrance(has ramp access in back if needed) Entrance Stairs-Rails: Right;Left(wide; typically uses only single rail at a times) Entrance Stairs-Number of Steps: 3 Home Layout: One level;Laundry or work area in basement        Prior Function Level of Independence: Independent         Comments: Indep with ADLs, household and community mobilization without assist device; + driving; assumes all household responsibilities and community errands indep; denies fall history.     Hand Dominance        Extremity/Trunk Assessment   Upper Extremity Assessment Upper Extremity Assessment: Overall WFL for tasks assessed  Lower Extremity Assessment Lower Extremity Assessment: Overall WFL for tasks assessed(grossly at least 4+/5 throughout; no focal weakness, sensory deficit or coordination deficit noted.)       Communication   Communication: No difficulties  Cognition  Arousal/Alertness: Awake/alert Behavior During Therapy: WFL for tasks assessed/performed Overall Cognitive Status: Within Functional Limits for tasks assessed                                        General Comments      Exercises     Assessment/Plan    PT Assessment Patent does not need any further PT services  PT Problem List         PT Treatment Interventions DME instruction;Gait training;Functional mobility training;Therapeutic activities;Therapeutic exercise;Balance training;Neuromuscular re-education;Patient/family education    PT Goals (Current goals can be found in the Care Plan section)  Acute Rehab PT Goals Patient Stated Goal: to get out of here soon PT Goal Formulation: All assessment and education complete, DC therapy Time For Goal Achievement: 07/12/18 Potential to Achieve Goals: Good    Frequency     Barriers to discharge        Co-evaluation               AM-PAC PT "6 Clicks" Mobility  Outcome Measure Help needed turning from your back to your side while in a flat bed without using bedrails?: None Help needed moving from lying on your back to sitting on the side of a flat bed without using bedrails?: None Help needed moving to and from a bed to a chair (including a wheelchair)?: None Help needed standing up from a chair using your arms (e.g., wheelchair or bedside chair)?: None Help needed to walk in hospital room?: None Help needed climbing 3-5 steps with a railing? : A Little 6 Click Score: 23    End of Session Equipment Utilized During Treatment: Gait belt Activity Tolerance: Patient tolerated treatment well Patient left: in bed;with call bell/phone within reach;with family/visitor present(transport present for transition to testing) Nurse Communication: Mobility status PT Visit Diagnosis: Difficulty in walking, not elsewhere classified (R26.2)    Time: [id number] PT Time Calculation (min) (ACUTE ONLY): 18 min   Charges:    PT Evaluation $PT Eval Moderate Complexity: 1 Mod          Dredyn Gubbels H. Manson PasseyBrown, PT, DPT, NCS 07/12/18, 9:00 AM [phone]  Evaluation/treatment session was lead and completed by Lisbeth RenshawShane Courtney, SPT; directly observed, guided and documented by Cephus SlaterKristen Nitasha Jewel, PT.

## 2018-07-12 NOTE — Progress Notes (Signed)
*  PRELIMINARY RESULTS* Echocardiogram 2D Echocardiogram has been performed.  Cristela BlueHege, Abygail Galeno 07/12/2018, 9:55 AM

## 2018-07-12 NOTE — Progress Notes (Cosign Needed)
Subjective: Patient denies any new stroke or stroke like symptoms. Had uneventful night. Pending further stroke work up.  Objective: Current vital signs: BP (!) 96/57 (BP Location: Right Arm)   Pulse 76   Temp 98 F (36.7 C) (Oral)   Resp 14   Ht 5\' 10"  (1.778 m)   Wt 86.2 kg   SpO2 94%   BMI 27.26 kg/m  Vital signs in last 24 hours: Temp:  [97.5 F (36.4 C)-98.3 F (36.8 C)] 98 F (36.7 C) (12/10 0411) Pulse Rate:  [59-76] 76 (12/10 0411) Resp:  [12-20] 14 (12/10 0411) BP: (95-143)/(57-102) 96/57 (12/10 0411) SpO2:  [94 %-100 %] 94 % (12/10 0411) Weight:  [86.2 kg] 86.2 kg (12/09 1036)  Intake/Output from previous day: No intake/output data recorded. Intake/Output this shift: No intake/output data recorded. Nutritional status:  Diet Order            Diet Heart Room service appropriate? Yes; Fluid consistency: Thin  Diet effective now             Neurological Exam  Mental Status: Alert, oriented, thought content appropriate. Speech fluent without evidence of aphasia. Able to follow 3 step commands without difficulty. Attention span and concentration seemed appropriate  Cranial Nerves: II: Discs flat bilaterally; Visual fields grossly normal, pupils equal, round, reactive to light and accommodation III,IV, VI: ptosis not present, extra-ocular motions intact bilaterally V,VII: smile symmetric, facial light touch sensationintact VIII: hearing normal bilaterally IX,X: gag reflex present XI: bilateral shoulder shrug XII: midline tongue extension Motor: Right :Upper extremity 5/5Without pronator driftLeft: Upper extremity 5/5 without pronator drift Right:Lower extremity 5/5Left: Lower extremity 5/5 Tone and bulk:normal tone throughout; no atrophy noted Sensory: Pinprick and light touchintact bilaterally Deep Tendon Reflexes: 2+ and symmetric throughout Plantars: Right:  DowngoingLeft: Downgoing  Cerebellar: Finger-to-nosetesting intact bilaterally.Heel to shin testing normal bilaterally Gait: not tested due to safety concerns  Data Reviewed   Lab Results: Basic Metabolic Panel: Recent Labs  Lab 07/11/18 1047  NA 139  K 4.0  CL 105  CO2 26  GLUCOSE 79  BUN 26*  CREATININE 1.19  CALCIUM 9.4    Liver Function Tests: Recent Labs  Lab 07/11/18 1047  AST 22  ALT 20  ALKPHOS 30*  BILITOT 0.9  PROT 7.3  ALBUMIN 4.0   No results for input(s): LIPASE, AMYLASE in the last 168 hours. No results for input(s): AMMONIA in the last 168 hours.  CBC: Recent Labs  Lab 07/11/18 1047  WBC 6.8  HGB 12.5*  HCT 37.9*  MCV 99.5  PLT 215    Cardiac Enzymes: Recent Labs  Lab 07/11/18 1047  TROPONINI <0.03    Lipid Panel: No results for input(s): CHOL, TRIG, HDL, CHOLHDL, VLDL, LDLCALC in the last 168 hours.  CBG: No results for input(s): GLUCAP in the last 168 hours.  Microbiology: No results found for this or any previous visit.  Coagulation Studies: No results for input(s): LABPROT, INR in the last 72 hours.  Imaging: Ct Head Wo Contrast  Result Date: 07/11/2018 CLINICAL DATA:  Pt to ER via EMS from home states he went out to breakfast this AM and when he arrived home was unable to remember how to log on to the computer. On EMS arrival pt was feeling better, but while EMS was conducting neuro exam, again had forgetfulness. PT A&O x 4 at this time. Pt has hx of TIA last year. EXAM: CT HEAD WITHOUT CONTRAST TECHNIQUE: Contiguous axial images were obtained from the base  of the skull through the vertex without intravenous contrast. COMPARISON:  11/09/2016 FINDINGS: Brain: No evidence of acute infarction, hemorrhage, hydrocephalus, extra-axial collection or mass lesion/mass effect. Vascular: There is ventricular sulcal enlargement reflecting mild generalized atrophy. Patchy periventricular white matter  hypoattenuation is also present consistent with mild chronic microvascular ischemic change. Skull: Normal. Negative for fracture or focal lesion. Sinuses/Orbits: Visualized globes and orbits are unremarkable. The visualized sinuses and mastoid air cells are clear. Other: None. IMPRESSION: 1. No acute intracranial abnormalities. 2. Mild generalized atrophy and chronic microvascular ischemic change, stable from the prior head CT. Electronically Signed   By: Amie Portland M.D.   On: 07/11/2018 11:03    Medications:  I have reviewed the patient's current medications. Prior to Admission:  Medications Prior to Admission  Medication Sig Dispense Refill Last Dose  . atorvastatin (LIPITOR) 20 MG tablet Take 20 mg by mouth at bedtime.   07/10/2018 at 2000  . clopidogrel (PLAVIX) 75 MG tablet Take 75 mg by mouth daily.   07/10/2018 at 0800  . aspirin 81 MG chewable tablet Chew 81 mg by mouth at bedtime.   Not Taking at Unknown time  . metoprolol succinate (TOPROL-XL) 50 MG 24 hr tablet Take 50 mg by mouth daily. Take with or immediately following a meal.   Not Taking at Unknown time  . Multiple Vitamin (MULTI-VITAMINS) TABS Take 1 tablet by mouth daily.   11/09/2016 at am  . zolpidem (AMBIEN) 5 MG tablet Take 5 mg by mouth at bedtime as needed for sleep.    Not Taking at Unknown time   Scheduled: . aspirin  81 mg Oral QHS  . atorvastatin  20 mg Oral QHS  . clopidogrel  75 mg Oral Daily  . enoxaparin (LOVENOX) injection  40 mg Subcutaneous Q24H   Assessment: 84 y.o. male with past medical history of TIA, chronic kidney disease stage III, hyperlipidemia, bradycardia status post pacemaker, GERD, and asthma presenting to the ED with chief complaints of confusion.  Symptoms now resolved. Concerns for TIA.  CT head reviewed and showed no acute intracranial abnormality.  Patient unable to obtain MRI of the brain due to pacer.  Started on DUAP since was only on Plavix prior to this event.  Plan: 1. HgbA1c, fasting  lipid panel pending 2. Prophylactic therapy- Dual therapy Aspirin 81 mg/day and Plavix 75 mg /day  3. PT consult, OT consult, Speech consult 4. Echocardiogram pending 5. Carotid dopplers pending 6. NPO until RN stroke swallow screen 7. Telemetry monitoring 8. Frequent neuro checks 9. If above work up unrevealing would consider prolonged cardiac monitoring on an outpatient basis.    This patient was staffed with Dr. Verlon Au, Thad Ranger who personally evaluated patient, reviewed documentation and agreed with assessment and plan of care as above.  Webb Silversmith, DNP, FNP-BC Board certified Nurse Practitioner Neurology Department   LOS: 0 days   @MPKSIGN @ 07/12/2018  8:22 AM

## 2018-07-13 NOTE — Discharge Summary (Signed)
[facility] [place] [facility]   PATIENT NAME: Elijah Collins    MR#:  [id number]  DATE OF BIRTH:  08/26/1924  DATE OF ADMISSION:  07/11/2018   ADMITTING PHYSICIAN: Katha Hamming, MD  DATE OF DISCHARGE: 07/12/2018 11:25 AM  PRIMARY CARE PHYSICIAN: Barbette Reichmann, MD   ADMISSION DIAGNOSIS:  TIA (transient ischemic attack) [G45.9] DISCHARGE DIAGNOSIS:  Active Problems:   TIA (transient ischemic attack)  SECONDARY DIAGNOSIS:   Past Medical History:  Diagnosis Date  . Asthma   . Cardiac arrhythmia   . TIA (transient ischemic attack)    HOSPITAL COURSE:   84 y.o.malewith past medical history of TIA, chronic kidney disease stage III, hyperlipidemia, bradycardia status post pacemaker, GERD, and asthma presenting to the ED with chief complaints of confusion.Symptoms now resolved. Concerns for TIA.CT head reviewed and showed no acute intracranial abnormality. Patient unable to obtain MRI of the brain due to pacer. Started on DUAP since was only on Plavix prior to this event.  Confusion was resolved and was D/C in stable condition. No deficits noted. Recommended to take asa + plavix + statin DISCHARGE CONDITIONS:  stable CONSULTS OBTAINED:  Treatment Team:  Kym Groom, MD DRUG ALLERGIES:  No Known Allergies DISCHARGE MEDICATIONS:   Allergies as of 07/12/2018   No Known Allergies     Medication List    STOP taking these medications   metoprolol succinate 50 MG 24 hr tablet Commonly known as:  TOPROL-XL     TAKE these medications   aspirin 81 MG chewable tablet Chew 81 mg by mouth at bedtime.   atorvastatin 20 MG tablet Commonly known as:  LIPITOR Take 20 mg by mouth at bedtime.   clopidogrel 75 MG tablet Commonly known as:  PLAVIX Take 75 mg by mouth daily.   MULTI-VITAMINS Tabs Take 1 tablet by mouth daily.   zolpidem 5 MG tablet Commonly known as:  AMBIEN Take 5 mg by mouth at bedtime as needed for sleep.          DISCHARGE INSTRUCTIONS:   DIET:  Low fat, Low cholesterol diet DISCHARGE CONDITION:  Good ACTIVITY:  Activity as tolerated OXYGEN:  Home Oxygen: No.  Oxygen Delivery: room air DISCHARGE LOCATION:  home   If you experience worsening of your admission symptoms, develop shortness of breath, life threatening emergency, suicidal or homicidal thoughts you must seek medical attention immediately by calling 911 or calling your MD immediately  if symptoms less severe.  You Must read complete instructions/literature along with all the possible adverse reactions/side effects for all the Medicines you take and that have been prescribed to you. Take any new Medicines after you have completely understood and accpet all the possible adverse reactions/side effects.   Please note  You were cared for by a hospitalist during your hospital stay. If you have any questions about your discharge medications or the care you received while you were in the hospital after you are discharged, you can call the unit and asked to speak with the hospitalist on call if the hospitalist that took care of you is not available. Once you are discharged, your primary care physician will handle any further medical issues. Please note that NO REFILLS for any discharge medications will be authorized once you are discharged, as it is imperative that you return to your primary care physician (or establish a relationship with a primary care physician if you do not have one) for your aftercare needs so that they can reassess your  need for medications and monitor your lab values.    On the day of Discharge:  VITAL SIGNS:  Blood pressure (!) 96/57, pulse 76, temperature 98 F (36.7 C), temperature source Oral, resp. rate 14, height 5\' 10"  (1.778 m), weight 86.2 kg, SpO2 94 %. PHYSICAL EXAMINATION:  GENERAL:  84 y.o.-year-old patient lying in the bed with no acute distress.  EYES: Pupils equal, round, reactive to light and  accommodation. No scleral icterus. Extraocular muscles intact.  HEENT: Head atraumatic, normocephalic. Oropharynx and nasopharynx clear.  NECK:  Supple, no jugular venous distention. No thyroid enlargement, no tenderness.  LUNGS: Normal breath sounds bilaterally, no wheezing, rales,rhonchi or crepitation. No use of accessory muscles of respiration.  CARDIOVASCULAR: S1, S2 normal. No murmurs, rubs, or gallops.  ABDOMEN: Soft, non-tender, non-distended. Bowel sounds present. No organomegaly or mass.  EXTREMITIES: No pedal edema, cyanosis, or clubbing.  NEUROLOGIC: Cranial nerves II through XII are intact. Muscle strength 5/5 in all extremities. Sensation intact. Gait not checked.  PSYCHIATRIC: The patient is alert and oriented x 3.  SKIN: No obvious rash, lesion, or ulcer.  DATA REVIEW:   CBC Recent Labs  Lab 07/11/18 1047  WBC 6.8  HGB 12.5*  HCT 37.9*  PLT 215    Chemistries  Recent Labs  Lab 07/11/18 1047  NA 139  K 4.0  CL 105  CO2 26  GLUCOSE 79  BUN 26*  CREATININE 1.19  CALCIUM 9.4  AST 22  ALT 20  ALKPHOS 30*  BILITOT 0.9     Follow-up Information    Barbette ReichmannHande, Vishwanath, MD. Go on 07/14/2018.   Specialty:  Internal Medicine Why:  @11 :45 AM Contact information: [address] [facility] [place] [address] [phone]        Marcina MillardParaschos, Alexander, MD. Go on 07/20/2018.   Specialty:  Cardiology Why:  @11 :00 AM Contact information: [address] [facility] [place] [address] [phone]        Lonell FaceShah, Hemang K, MD. Go on 09/23/2018.   Specialty:  Neurology Why:  @10 :30 AM Contact information: [address] [facility] [place] [address] [phone]            Management plans discussed with the patient, family and they are in agreement.  CODE STATUS: Prior   TOTAL TIME TAKING CARE OF THIS PATIENT: 45 minutes.    Delfino LovettVipul Zadia Uhde M.D on 07/13/2018 at 8:35  PM  Between 7am to 6pm - Pager - [phone]  After 6pm go to [email or web address] - Social research officer, [email or web address] [email or web address]  [facility] [place] [facility]  Office  [phone]  CC: Primary care physician; Barbette ReichmannHande, Vishwanath, MD   Note: This dictation was prepared with Dragon dictation along with smaller phrase technology. Any transcriptional errors that result from this process are unintentional.

## 2019-06-07 ENCOUNTER — Other Ambulatory Visit: Payer: Self-pay | Admitting: Surgery

## 2019-06-07 ENCOUNTER — Ambulatory Visit: Payer: Medicare Other | Admitting: Surgery

## 2019-06-07 ENCOUNTER — Encounter: Payer: Self-pay | Admitting: Surgery

## 2019-06-07 ENCOUNTER — Other Ambulatory Visit: Payer: Self-pay

## 2019-06-07 VITALS — BP 110/70 | HR 80 | Temp 97.9°F | Ht 70.5 in | Wt 197.0 lb

## 2019-06-07 DIAGNOSIS — L729 Follicular cyst of the skin and subcutaneous tissue, unspecified: Secondary | ICD-10-CM

## 2019-06-07 NOTE — Patient Instructions (Addendum)
Patient may take a shower Friday morning. Patient may shower with the dressing on and after shower, patient can come in the office Friday for dressing change.   Patient has a follow up appointment for a dressing change 06/09/19 and 06/12/19 at 9:00am.

## 2019-06-09 ENCOUNTER — Ambulatory Visit: Payer: Medicare Other

## 2019-06-09 ENCOUNTER — Encounter: Payer: Self-pay | Admitting: Surgery

## 2019-06-09 ENCOUNTER — Other Ambulatory Visit: Payer: Self-pay

## 2019-06-09 DIAGNOSIS — L729 Follicular cyst of the skin and subcutaneous tissue, unspecified: Secondary | ICD-10-CM

## 2019-06-09 NOTE — Progress Notes (Signed)
06/07/2019  Reason for Visit:  Infected sebaceous cyst  Referring Provider:  Gay Filler, PA-C  History of Present Illness: Elijah Collins. is a 85 y.o. male with a history of prior sebaceous cysts, particularly of the back, s/p prior excisions by Dr. Pat Patrick.  He presents today because he has a cyst on his right lower back which had become infected.  He went to his PCP's office on 10/16 and had I&D of the cyst and started on Doxycycline.  He was followed up in the office and on 10/29 when he was seen last, the area was still draining.  New prescription given for Augmentin.  Referral was made for further evaluation.  The patient reports the area is sore when he leans on his back.  Denies any fevers, chills.  He still has drainage from the wound.  Past Medical History: Past Medical History:  Diagnosis Date  . Asthma   . Cardiac arrhythmia   . Shortness of breath   . TIA (transient ischemic attack)      Past Surgical History: Past Surgical History:  Procedure Laterality Date  . APPENDECTOMY  2011   [facility]  . HERNIA REPAIR Right 10 years ago   [facility]  . PACEMAKER IMPLANT      Home Medications: Prior to Admission medications   Medication Sig Start Date End Date Taking? Authorizing Provider  aspirin 81 MG chewable tablet Chew 81 mg by mouth at bedtime.   Yes [provider]  atorvastatin (LIPITOR) 20 MG tablet Take 20 mg by mouth at bedtime.   Yes [provider]  clopidogrel (PLAVIX) 75 MG tablet Take 75 mg by mouth daily.   Yes [provider]  Melatonin 1 MG TABS Take by mouth.   Yes [provider]  Multiple Vitamin (MULTI-VITAMINS) TABS Take 1 tablet by mouth daily.   Yes [provider]  sertraline (ZOLOFT) 50 MG tablet Take 50 mg by mouth daily. 04/27/19  Yes [provider]  zolpidem (AMBIEN) 5 MG tablet Take 5 mg by mouth at bedtime as needed for sleep.    Yes [provider]    Allergies: No Known  Allergies  Social History:  reports that he has never smoked. He has never used smokeless tobacco. He reports that he does not drink alcohol or use drugs.   Family History: Family History  Problem Relation Age of Onset  . CVA Mother   . COPD Father     Review of Systems: Review of Systems  Constitutional: Negative for chills and fever.  HENT: Negative for hearing loss.   Eyes: Negative for blurred vision.  Respiratory: Negative for shortness of breath.   Cardiovascular: Negative for chest pain.  Gastrointestinal: Negative for abdominal pain, nausea and vomiting.  Genitourinary: Negative for dysuria.  Musculoskeletal: Negative for myalgias.  Skin:       Draining infected sebaceous cyst of right lower back  Neurological: Negative for dizziness.  Psychiatric/Behavioral: Negative for depression.    Physical Exam BP 110/70   Pulse 80   Temp 97.9 F (36.6 C)   Ht 5' 10.5" (1.791 m)   Wt 197 lb (89.4 kg)   BMI 27.87 kg/m  CONSTITUTIONAL: No acute distress HEENT:  Normocephalic, atraumatic, extraocular motion intact. NECK: Trachea is midline, and there is no jugular venous distension.  RESPIRATORY:  Lungs are clear, and breath sounds are equal bilaterally. Normal respiratory effort without pathologic use of accessory muscles. CARDIOVASCULAR: Heart is regular without murmurs, gallops,  or rubs. GI: The abdomen is soft, non-distended, non-tender.  MUSCULOSKELETAL:  Normal muscle strength and tone in all four extremities.  No peripheral edema or cyanosis. SKIN: The patient has multiple cysts over his back and prior I&D or excision sites.  In the right lower back, there are two 5 mm wounds side by side from one draining cyst.  There is some keratin and purulent material draining.  No significant erythema or induration surrounding the wounds.  One wound was too small to probe, and the other was probed with qtip revealing a larger approximately 3 x 2 cm cavity.  NEUROLOGIC:  Motor and  sensation is grossly normal.  Cranial nerves are grossly intact. PSYCH:  Alert and oriented to person, place and time. Affect is normal.  Laboratory Analysis: No results found for this or any previous visit (from the past 24 hour(s)).  Imaging: No results found.  Assessment and Plan: This is a 85 y.o. male with an infected right lower back sebaceous cyst.  Discussed with patient that we can do further I&D at bedside today.  Discussed risks of bleeding, infection, injury to surrounding structures, and he's willing to proceed.  We would leave the wound open and would need dressing changes.  He lives by himself and does not have anyone to help him.  We will arrange for him to come to our office for nurse visits for dressing changes.   Procedure Date:  06/07/2019  Pre-operative Diagnosis:  Right lower back infected sebaceous cyst  Post-operative Diagnosis:  Right lower back infected sebaceous cyst  Procedure:  Incision and Drainage and excision of infected sebaceous cyst  Surgeon:  Howie Ill, MD  Anesthesia:  5 ml 1% lidocaine with epi.  Estimated Blood Loss:  5 ml  Specimens:  Cyst capsule  Complications:  None  Indications for Procedure:  This is a 85 y.o. male with diagnosis of an infected sebaceous cyst, requiring drainage procedure.  The risks of bleeding, abscess or infection, injury to surrounding structures, and need for further procedures were all discussed with the patient and was willing to proceed.  Description of Procedure: The patient was correctly identified at bedside.  Appropriate time-outs were performed prior to procedure.  The patient's right lower back was prepped and draped in usual sterile fashion.  Local anesthetic was infused intradermally.  A 2 cm incision was made over the cyst connecting the two small wounds, revealing some purulent and keratin fluid.  Small Kelly forceps were used to dissect around the abscess tissue to open any remaining pockets  of purulent fluid.  In doing this, I was able to sharply excise the majority of the cyst contents and capsule in fragments using scissors and scalpel.  The wound cavity measured about 3 x 2 cm.  After drainage was completed, the cavity was irrigated and cleaned.  The wound was packed with 4x4 gauze and covered with dry gauze and tape.  The patient tolerated the procedure well and all sharps were appropriately disposed of at the end of the case.   --Patient will follow up on 11/6 with RN visit for dressing change. --Continue antibiotics already prescribed and complete course.    Howie Ill, MD [facility]

## 2019-06-09 NOTE — Progress Notes (Signed)
Patient ID: Elijah Mees., male   DOB: 26-Sep-1924, 85 y.o.   MRN: [id number]  Patient came in today for a wound check.  The wound is clean, with no signs of infection noted. 4x4 packing changed and new dressing applied. Follow up as scheduled on Monday.

## 2019-06-12 ENCOUNTER — Other Ambulatory Visit: Payer: Self-pay

## 2019-06-12 ENCOUNTER — Ambulatory Visit: Payer: Medicare Other

## 2019-06-12 VITALS — Temp 97.5°F

## 2019-06-12 DIAGNOSIS — L729 Follicular cyst of the skin and subcutaneous tissue, unspecified: Secondary | ICD-10-CM

## 2019-06-13 ENCOUNTER — Ambulatory Visit: Payer: Medicare Other

## 2019-06-13 ENCOUNTER — Other Ambulatory Visit: Payer: Self-pay

## 2019-06-13 DIAGNOSIS — L729 Follicular cyst of the skin and subcutaneous tissue, unspecified: Secondary | ICD-10-CM

## 2019-06-13 NOTE — Progress Notes (Signed)
Patient came in today for a wound check.  The wound is clean, with no signs of infection noted. Wound repacked with plain packing. Follow up as scheduled.

## 2019-06-14 ENCOUNTER — Ambulatory Visit: Payer: Medicare Other

## 2019-06-14 ENCOUNTER — Other Ambulatory Visit: Payer: Self-pay

## 2019-06-14 VITALS — BP 109/68 | HR 65 | Temp 97.2°F

## 2019-06-14 DIAGNOSIS — L729 Follicular cyst of the skin and subcutaneous tissue, unspecified: Secondary | ICD-10-CM

## 2019-06-14 NOTE — Progress Notes (Signed)
Patient came in today for a wound check. The wound is clean, with no signs of infection noted. Wound repacked with 1/4 in packing and bandage applied. Follow up as scheduled.

## 2019-06-15 ENCOUNTER — Ambulatory Visit: Payer: Medicare Other

## 2019-06-15 ENCOUNTER — Other Ambulatory Visit: Payer: Self-pay

## 2019-06-15 DIAGNOSIS — L729 Follicular cyst of the skin and subcutaneous tissue, unspecified: Secondary | ICD-10-CM

## 2019-06-15 NOTE — Progress Notes (Signed)
Patient ID: Elijah Collins., male   DOB: 1924-10-29, 85 y.o.   MRN: [id number] Patient came in today for a wound check.  The wound is clean, with no signs of infection noted. Wound repacked with 1/2 inch packing and bandage applied. Follow up as scheduled.

## 2019-06-16 ENCOUNTER — Ambulatory Visit: Payer: Medicare Other

## 2019-06-16 ENCOUNTER — Other Ambulatory Visit: Payer: Self-pay

## 2019-06-16 DIAGNOSIS — L729 Follicular cyst of the skin and subcutaneous tissue, unspecified: Secondary | ICD-10-CM

## 2019-06-16 NOTE — Progress Notes (Signed)
Patient ID: Elijah Schweiss., male   DOB: January 15, 1925, 85 y.o.   MRN: [id number] Patient came in today for a wound check. The wound is clean, with no signs of infection noted. Packing removed and wound repacked with 1/2 in gauze, 2 inches in length and bandage applied. Patient's daughter here to observe for teaching for packing over the weekend. She expresses understanding and ability to repack on Saturday and Sunday. Patient to follow up on Monday as scheduled.

## 2019-06-19 ENCOUNTER — Ambulatory Visit: Payer: Medicare Other

## 2019-06-20 ENCOUNTER — Ambulatory Visit: Payer: Medicare Other

## 2019-06-20 ENCOUNTER — Other Ambulatory Visit: Payer: Self-pay

## 2019-06-21 ENCOUNTER — Ambulatory Visit: Payer: Medicare Other

## 2019-06-22 ENCOUNTER — Emergency Department
Admission: EM | Admit: 2019-06-22 | Discharge: 2019-06-22 | Disposition: A | Discharge status: Home / Self Care | Payer: Medicare Other | Attending: Student | Admitting: Student

## 2019-06-22 ENCOUNTER — Other Ambulatory Visit: Payer: Self-pay

## 2019-06-22 ENCOUNTER — Emergency Department: Payer: Medicare Other

## 2019-06-22 DIAGNOSIS — R0602 Shortness of breath: Secondary | ICD-10-CM | POA: Insufficient documentation

## 2019-06-22 DIAGNOSIS — Z79899 Other long term (current) drug therapy: Secondary | ICD-10-CM | POA: Diagnosis not present

## 2019-06-22 DIAGNOSIS — J45909 Unspecified asthma, uncomplicated: Secondary | ICD-10-CM | POA: Insufficient documentation

## 2019-06-22 DIAGNOSIS — Z950 Presence of cardiac pacemaker: Secondary | ICD-10-CM | POA: Insufficient documentation

## 2019-06-22 DIAGNOSIS — R0609 Other forms of dyspnea: Secondary | ICD-10-CM

## 2019-06-22 DIAGNOSIS — Z7982 Long term (current) use of aspirin: Secondary | ICD-10-CM | POA: Diagnosis not present

## 2019-06-22 DIAGNOSIS — Z20828 Contact with and (suspected) exposure to other viral communicable diseases: Secondary | ICD-10-CM | POA: Diagnosis not present

## 2019-06-22 HISTORY — DX: Hyperlipidemia, unspecified: E78.5

## 2019-06-22 LAB — BASIC METABOLIC PANEL
Anion gap: 7 (ref 5–15)
BUN: 28 mg/dL — ABNORMAL HIGH (ref 8–23)
CO2: 25 mmol/L (ref 22–32)
Calcium: 9.7 mg/dL (ref 8.9–10.3)
Chloride: 106 mmol/L (ref 98–111)
Creatinine, Ser: 1.32 mg/dL — ABNORMAL HIGH (ref 0.61–1.24)
GFR calc Af Amer: 54 mL/min — ABNORMAL LOW (ref >60)
GFR calc non Af Amer: 47 mL/min — ABNORMAL LOW (ref >60)
Glucose, Bld: 96 mg/dL (ref 70–99)
Potassium: 5.1 mmol/L (ref 3.5–5.1)
Sodium: 138 mmol/L (ref 135–145)

## 2019-06-22 LAB — CBC
HCT: 36.8 % — ABNORMAL LOW (ref 39.0–52.0)
Hemoglobin: 12.7 g/dL — ABNORMAL LOW (ref 13.0–17.0)
MCH: 32.9 pg (ref 26.0–34.0)
MCHC: 34.5 g/dL (ref 30.0–36.0)
MCV: 95.3 fL (ref 80.0–100.0)
Platelets: 195 10*3/uL (ref 150–400)
RBC: 3.86 MIL/uL — ABNORMAL LOW (ref 4.22–5.81)
RDW: 13.1 % (ref 11.5–15.5)
WBC: 6.6 10*3/uL (ref 4.0–10.5)
nRBC: 0 % (ref 0.0–0.2)

## 2019-06-22 LAB — TROPONIN I (HIGH SENSITIVITY)
Troponin I (High Sensitivity): 15 ng/L
Troponin I (High Sensitivity): 15 ng/L

## 2019-06-22 LAB — BRAIN NATRIURETIC PEPTIDE: B Natriuretic Peptide: 664 pg/mL — ABNORMAL HIGH (ref 0.0–100.0)

## 2019-06-22 MED ORDER — FUROSEMIDE 40 MG PO TABS
40.0000 mg | ORAL_TABLET | Freq: Once | ORAL | Status: AC
  Administered 2019-06-22: 40 mg via ORAL
  Filled 2019-06-22: qty 1

## 2019-06-22 MED ORDER — SODIUM CHLORIDE 0.9% FLUSH: 3.0000 mL | Freq: Once | INTRAVENOUS | Status: DC

## 2019-06-22 MED ORDER — FUROSEMIDE 10 MG/ML IJ SOLN: 20.0000 mg | Freq: Once | INTRAMUSCULAR | Status: DC

## 2019-06-22 NOTE — ED Notes (Signed)
Jeanie Cooks Rn, and me attemped IV start on pt. All unsuccessful.

## 2019-06-22 NOTE — ED Notes (Signed)
Katie RN interrogated pt's pacemaker

## 2019-06-22 NOTE — ED Triage Notes (Addendum)
Pt sent from [facility] with c/o increased SOB with excertion and dizziness for over the past week the patient is a/ox4 on arrival, states he did trip and fall about 2 weeks ago, denies LOC pt is taking plavix.Marland Kitchen

## 2019-06-22 NOTE — Discharge Instructions (Signed)
Thank you for letting us take care of you in the emergency department today.   Please continue to take any regular, prescribed medications.   Please follow up with: Darylene Price, at the heart failure clinic to follow up on your shortness of breath - Your cardiologist, to schedule the update of your pacemaker  Your COVID swab is pending, and you will receive a call in 1-3 days if it's positive.   Please return to the ER for any new or worsening symptoms.

## 2019-06-22 NOTE — ED Notes (Signed)
IV team at bedside 

## 2019-06-22 NOTE — Progress Notes (Signed)
VAST consulted to place PIV for Lasix. Pt stuck mutiple times by ER staff. Attempted PIV placement unsuccessfully. Pt's nurse came in room and informed him that Lasix will be given by mouth so that he doesn't have to be stuck anymore.

## 2019-06-22 NOTE — ED Provider Notes (Signed)
[facility] Emergency Department Provider Note  ____________________________________________   First MD Initiated Contact with Patient 06/22/19 1238     (approximate)  I have reviewed the triage vital signs and the nursing notes.  History  Chief Complaint Shortness of Breath    HPI Elijah Collins. is a 85 y.o. male with hx of SA node dysfunction s/p pacemaker, HLD, asthma who presents for SOB, DOE. Symptoms have been present and progressively worsening for last few weeks. Symptoms worsened with exertion, when they are also associated with some lightheadedness. He denies any wheezing or cough. No chest pain. No fevers. No sick contacts or known COVID exposure. He denies any syncope, but does report a mechanical fall several weeks ago, no injuries as a result. Not on any anticoagulation.    Past Medical Hx Past Medical History:  Diagnosis Date  . Asthma   . Cardiac arrhythmia   . Hyperlipidemia   . Shortness of breath   . TIA (transient ischemic attack)     Problem List Patient Active Problem List   Diagnosis Date Noted  . TIA (transient ischemic attack) 11/09/2016    Past Surgical Hx Past Surgical History:  Procedure Laterality Date  . APPENDECTOMY  2011   [facility]  . HERNIA REPAIR Right 10 years ago   [facility]  . PACEMAKER IMPLANT      Medications Prior to Admission medications   Medication Sig Start Date End Date Taking? Authorizing Provider  aspirin 81 MG chewable tablet Chew 81 mg by mouth at bedtime.    [provider]  atorvastatin (LIPITOR) 20 MG tablet Take 20 mg by mouth at bedtime.    [provider]  clopidogrel (PLAVIX) 75 MG tablet Take 75 mg by mouth daily.    [provider]  Melatonin 1 MG TABS Take by mouth.    [provider]  Multiple Vitamin (MULTI-VITAMINS) TABS Take 1 tablet by mouth daily.    [provider]  sertraline (ZOLOFT) 50 MG tablet Take 50 mg by mouth daily.  04/27/19   [provider]  zolpidem (AMBIEN) 5 MG tablet Take 5 mg by mouth at bedtime as needed for sleep.     [provider]    Allergies Patient has no known allergies.  Family Hx Family History  Problem Relation Age of Onset  . CVA Mother   . COPD Father     Social Hx Social History   Tobacco Use  . Smoking status: Never Smoker  . Smokeless tobacco: Never Used  Substance Use Topics  . Alcohol use: No  . Drug use: No     Review of Systems  Constitutional: Negative for fever, chills. Eyes: Negative for visual changes. ENT: Negative for sore throat. Cardiovascular: Negative for chest pain. Respiratory: Negative for shortness of breath. Gastrointestinal: Negative for nausea, vomiting.  Genitourinary: Negative for dysuria. Musculoskeletal: Negative for leg swelling. Skin: Negative for rash. Neurological: Negative for for headaches.   Physical Exam  Vital Signs: ED Triage Vitals  Enc Vitals Group     BP 06/22/19 1133 118/63     Pulse Rate 06/22/19 1133 65     Resp 06/22/19 1133 18     Temp 06/22/19 1133 97.7 F (36.5 C)     Temp Source 06/22/19 1133 Oral     SpO2 06/22/19 1133 98 %     Weight 06/22/19 1136 192 lb (87.1 kg)     Height 06/22/19 1136 5\' 10"  (1.778 m)  Head Circumference --      Peak Flow --      Pain Score 06/22/19 1136 0     Pain Loc --      Pain Edu? --      Excl. in [place]? --     Constitutional: Alert and oriented.  Head: Normocephalic. Atraumatic. Eyes: Conjunctivae clear. Sclera anicteric. Nose: No congestion. No rhinorrhea. Mouth/Throat: Wearing mask.  Neck: No stridor.   Cardiovascular: Normal rate, regular rhythm. Pacemaker in upper chest. Extremities well perfused. Respiratory: Normal respiratory effort.  Lungs CTAB. No wheezing.  Gastrointestinal: Soft. Non-tender. Non-distended.  Musculoskeletal: Mild symmetric pitting edema to BLE to distal tibia.  Neurologic:  Normal speech and language. No gross focal  neurologic deficits are appreciated.  Skin: Skin is warm, dry and intact. No rash noted. Psychiatric: Mood and affect are appropriate for situation.  EKG  Personally reviewed.   Rate: 65, ventricular paced Rhythm: ventricular paced Axis: LAD Intervals: abnormal due to paced rhythm Does not meet Sgarbossa criteria No STEMI    Radiology  XR: IMPRESSION:  No active cardiopulmonary disease.    Procedures  Procedure(s) performed (including critical care):  Procedures   Initial Impression / Assessment and Plan / ED Course  85 y.o. male who presents to the ED for SOB, DOE as above  Ddx: HF, infection, arrhythmia, anemia, electrolyte abnormality, COVID  Will obtain labs, EKG, interrogate pacemaker.   Interrogated pacemaker. Confirms ventricular paced rhythm at 65. Battery ready for replacement, patient aware and scheduling with cardiology. No arrhythmic events since September.   XR negative. No significant anemia or significant electrolyte abnormalities. Troponin x 2 negative. BNP mildly elevated, will give dose of Lasix here and plan for follow up in heart failure clinic. COVID swab sent and pending. Patient comfortable with plan and discharge. He understands he needs to touch base with his cardiology regarding his pacemaker battery. Given return precautions.    Final Clinical Impression(s) / ED Diagnosis  Final diagnoses:  SOB (shortness of breath)  DOE (dyspnea on exertion)       Note:  This document was prepared using Dragon voice recognition software and may include unintentional dictation errors.   Lilia Pro., MD 06/22/19 [phone]

## 2019-06-23 ENCOUNTER — Other Ambulatory Visit: Payer: Self-pay

## 2019-06-23 ENCOUNTER — Ambulatory Visit (INDEPENDENT_AMBULATORY_CARE_PROVIDER_SITE_OTHER): Payer: Medicare Other | Admitting: Surgery

## 2019-06-23 ENCOUNTER — Encounter: Payer: Self-pay | Admitting: Surgery

## 2019-06-23 VITALS — BP 91/56 | HR 70 | Temp 97.7°F | Resp 14 | Ht 70.0 in | Wt 192.0 lb

## 2019-06-23 DIAGNOSIS — Z09 Encounter for follow-up examination after completed treatment for conditions other than malignant neoplasm: Secondary | ICD-10-CM

## 2019-06-23 DIAGNOSIS — L729 Follicular cyst of the skin and subcutaneous tissue, unspecified: Secondary | ICD-10-CM | POA: Diagnosis not present

## 2019-06-23 LAB — NOVEL CORONAVIRUS, NAA (HOSP ORDER, SEND-OUT TO REF LAB; TAT 18-24 HRS): SARS-CoV-2, NAA: NOT DETECTED

## 2019-06-23 NOTE — Progress Notes (Signed)
06/23/2019  HPI: Elijah Calk. is a 85 y.o. male s/p I&D of lower back infected sebaceous cyst on 11/4.  He has been coming to the office for nurse visits for dressing changes, as he lives by himself and the wound required packing dressing changes.  He has been doing well from the wound standpoint and the wound continues to heal.    However, he presented to the ED yesterday because he's been noticing worsening shortness of breath.  CXR was negative for cardiomegaly or evidence of fluid overload.  His labs were overall unremarkable except for a BNP of 664.  His Troponin I was 15 x 2.  He reports he was given a diuretic dose and discharged home.  He called his cardiologist office but has not heard from them yet.  I cannot find any other BNP on his prior labs.    Vital signs: BP (!) 91/56   Pulse 70   Temp 97.7 F (36.5 C)   Resp 14   Ht 5\' 10"  (1.778 m)   Wt 192 lb (87.1 kg)   SpO2 100%   BMI 27.55 kg/m    Physical Exam: Constitutional:  No acute distress Skin:  Back I&D site is healing very well, measuring about 7 mm in size, and about 5 mm in depth.  There is 1/4 inch iodoform gauze packing but it's very shallow.  Packing removed and changed for BandAid.  Assessment/Plan: This is a 85 y.o. male s/p I&D of infected back sebaceous cyst.  --From the wound standpoint, the patient is healing really well and his wound is almost healed.  No further packing needed.  He will have his neighbor change the BandAid daily over the weekend and he will come back Monday for another nurse visit for wound check.  I think at that point, we can spread the follow up to two weeks.   Melvyn Neth, [facility]

## 2019-06-23 NOTE — Patient Instructions (Signed)
Please call our office if you have any questions or concerns.  

## 2019-06-24 NOTE — Progress Notes (Signed)
Patient ID: Elijah Witherington., male    DOB: October 03, 1924, 85 y.o.   MRN: [id number]  HPI  Elijah Collins is a 85 y/o male with a history of asthma, hyperlipidemia, stroke, pacemaker implantation and chronic heart failure.   Echo report from 07/12/18 reviewed and showed an EF of 55-60%.   Was in the ED 06/22/2019 due to heart failure exacerbation. Given a dose of lasix and he was released.   He presents today for his initial visit with a chief complaint of moderate shortness of breath upon minimal exertion. He describes this as chronic in nature having been present for years although it has worsened over the last month. He has associated cough, fatigue, dizziness (with sudden position changes), weakness and difficulty sleeping since his wife passed away Oct 31, 2018. He denies any abdominal distention, palpitations, pedal edema, chest pain or weight gain. Says that his pacemaker battery is due to be changed.   Past Medical History:  Diagnosis Date  . Asthma   . Cardiac arrhythmia   . CHF (congestive heart failure) (HCC)   . Hyperlipidemia   . Shortness of breath   . Stroke (HCC)   . TIA (transient ischemic attack)    Past Surgical History:  Procedure Laterality Date  . APPENDECTOMY  2011   [facility]  . HERNIA REPAIR Right 10 years ago   [facility]  . PACEMAKER IMPLANT     Family History  Problem Relation Age of Onset  . CVA Mother   . COPD Father    Social History   Tobacco Use  . Smoking status: Never Smoker  . Smokeless tobacco: Never Used  Substance Use Topics  . Alcohol use: No   No Known Allergies Prior to Admission medications   Medication Sig Start Date End Date Taking? Authorizing Provider  ALPRAZolam Prudy Feeler) 0.25 MG tablet Take 0.25 mg by mouth at bedtime as needed for anxiety.   Yes [provider]  aspirin 81 MG chewable tablet Chew 81 mg by mouth at bedtime.   Yes [provider]  atorvastatin (LIPITOR) 20 MG tablet Take 20 mg by mouth at bedtime.   Yes  [provider]  clopidogrel (PLAVIX) 75 MG tablet Take 75 mg by mouth daily.   Yes [provider]  Melatonin 1 MG TABS Take by mouth.   Yes [provider]  Multiple Vitamin (MULTI-VITAMINS) TABS Take 1 tablet by mouth daily.   Yes [provider]  sertraline (ZOLOFT) 50 MG tablet Take 50 mg by mouth daily. 04/27/19  Yes [provider]  vitamin B-12 (CYANOCOBALAMIN) 500 MCG tablet Take 500 mcg by mouth daily.   Yes [provider]    Review of Systems  Constitutional: Positive for fatigue (minimal). Negative for appetite change.  HENT: Negative for congestion, postnasal drip and sore throat.   Eyes: Negative.   Respiratory: Positive for cough (dry) and shortness of breath (worsening over the last month). Negative for chest tightness.   Cardiovascular: Negative for chest pain, palpitations and leg swelling.  Gastrointestinal: Negative for abdominal distention and abdominal pain.  Endocrine: Negative.   Genitourinary: Negative.   Musculoskeletal: Negative for back pain and neck pain.  Skin: Negative.   Allergic/Immunologic: Negative.   Neurological: Positive for dizziness (with sudden position changes) and weakness.  Hematological: Negative for adenopathy. Does not bruise/bleed easily.  Psychiatric/Behavioral: Positive for sleep disturbance (since his wife's passing in 31-Oct-2018). Negative for dysphoric mood. The patient is not nervous/anxious.  Vitals:   06/26/19 1106  BP: 104/63  Pulse: 79  Resp: 18  SpO2: 100%  Weight: 193 lb 6.4 oz (87.7 kg)  Height: 5\' 10"  (1.778 m)   Wt Readings from Last 3 Encounters:  06/26/19 193 lb 6.4 oz (87.7 kg)  06/23/19 192 lb (87.1 kg)  06/22/19 192 lb (87.1 kg)   Lab Results  Component Value Date   CREATININE 1.32 (H) 06/22/2019   CREATININE 1.19 07/11/2018   CREATININE 1.16 11/09/2016    Physical Exam Vitals signs and nursing note reviewed.  Constitutional:      Appearance: He  is well-developed.  HENT:     Head: Normocephalic and atraumatic.  Neck:     Musculoskeletal: Normal range of motion.     Vascular: No JVD.  Cardiovascular:     Rate and Rhythm: Normal rate and regular rhythm.  Pulmonary:     Effort: Pulmonary effort is normal. No respiratory distress.     Breath sounds: No wheezing or rales.  Abdominal:     Palpations: Abdomen is soft.     Tenderness: There is no abdominal tenderness.  Musculoskeletal:     Right lower leg: He exhibits no tenderness. Edema (trace pitting) present.     Left lower leg: He exhibits no tenderness. Edema (trace pitting) present.  Skin:    General: Skin is warm and dry.  Neurological:     General: No focal deficit present.     Mental Status: He is alert and oriented to person, place, and time.    Assessment & Plan:  1: Chronic heart failure with preserved ejection fraction- - NYHA class III - euvolemic today - weighing daily; instructed to call for an overnight weight gain of >2 pounds or a weekly weight gain of >5 pounds - says that he adds salt to "everything" and tends to eat frozen meals because he doesn't cook; emphasized the importance of reading food labels for sodium content so that he can try and keep his daily intake to 2000mg  sodium. Written dietary information given to him about this - saw cardiology (Paraschos) 05/29/2019 and returns next week - has pacemaker due to sinus bradycardia; time for battery change out - encouraged him to elevate his legs when sitting for long periods of time and to get low compression socks to wear daily; hesitant to begin low dose diuretic due to low blood pressure - BNP 06/22/2019 was 664.0 - has received his flu vaccine for this season  2: Hypotension- - BP on the low side today - saw PCP (Hande) 06/14/2019 - BMP from 06/22/2019 reviewed and showed sodium 138, potassium 5.1, creatinine 1.32 and GFR 47  Medication bottles were reviewed.   Will not make a return  appointment at this time. Advised patient and his daughter that he could call back at anytime to make another appointment.

## 2019-06-26 ENCOUNTER — Ambulatory Visit: Payer: Medicare Other | Attending: Family | Admitting: Family

## 2019-06-26 ENCOUNTER — Encounter: Payer: Self-pay | Admitting: Family

## 2019-06-26 ENCOUNTER — Ambulatory Visit (INDEPENDENT_AMBULATORY_CARE_PROVIDER_SITE_OTHER): Payer: Self-pay

## 2019-06-26 ENCOUNTER — Other Ambulatory Visit: Payer: Self-pay

## 2019-06-26 VITALS — BP 104/63 | HR 79 | Resp 18 | Ht 70.0 in | Wt 193.4 lb

## 2019-06-26 DIAGNOSIS — Z7982 Long term (current) use of aspirin: Secondary | ICD-10-CM | POA: Insufficient documentation

## 2019-06-26 DIAGNOSIS — Z950 Presence of cardiac pacemaker: Secondary | ICD-10-CM | POA: Diagnosis not present

## 2019-06-26 DIAGNOSIS — I959 Hypotension, unspecified: Secondary | ICD-10-CM | POA: Insufficient documentation

## 2019-06-26 DIAGNOSIS — I5032 Chronic diastolic (congestive) heart failure: Secondary | ICD-10-CM | POA: Insufficient documentation

## 2019-06-26 DIAGNOSIS — J45909 Unspecified asthma, uncomplicated: Secondary | ICD-10-CM | POA: Insufficient documentation

## 2019-06-26 DIAGNOSIS — Z7902 Long term (current) use of antithrombotics/antiplatelets: Secondary | ICD-10-CM | POA: Diagnosis not present

## 2019-06-26 DIAGNOSIS — Z8673 Personal history of transient ischemic attack (TIA), and cerebral infarction without residual deficits: Secondary | ICD-10-CM | POA: Diagnosis not present

## 2019-06-26 DIAGNOSIS — E785 Hyperlipidemia, unspecified: Secondary | ICD-10-CM | POA: Insufficient documentation

## 2019-06-26 DIAGNOSIS — Z79899 Other long term (current) drug therapy: Secondary | ICD-10-CM | POA: Diagnosis not present

## 2019-06-26 DIAGNOSIS — I950 Idiopathic hypotension: Secondary | ICD-10-CM

## 2019-06-26 DIAGNOSIS — Z09 Encounter for follow-up examination after completed treatment for conditions other than malignant neoplasm: Secondary | ICD-10-CM

## 2019-06-26 NOTE — Patient Instructions (Signed)
Continue weighing daily and call for an overnight weight gain of > 2 pounds or a weekly weight gain of >5 pounds. 

## 2019-06-26 NOTE — Progress Notes (Signed)
Wound healing well. No drainage. No redness.

## 2019-07-07 ENCOUNTER — Other Ambulatory Visit: Payer: Self-pay

## 2019-07-07 ENCOUNTER — Other Ambulatory Visit
Admission: RE | Admit: 2019-07-07 | Discharge: 2019-07-07 | Disposition: A | Discharge status: Home / Self Care | Payer: Medicare Other | Source: Ambulatory Visit | Attending: Physician Assistant | Admitting: Physician Assistant

## 2019-07-07 DIAGNOSIS — Z01812 Encounter for preprocedural laboratory examination: Secondary | ICD-10-CM | POA: Insufficient documentation

## 2019-07-07 DIAGNOSIS — Z20828 Contact with and (suspected) exposure to other viral communicable diseases: Secondary | ICD-10-CM | POA: Diagnosis not present

## 2019-07-07 LAB — SARS CORONAVIRUS 2 (TAT 6-24 HRS): SARS Coronavirus 2: NEGATIVE

## 2019-07-10 MED ORDER — SODIUM CHLORIDE 0.9 % IV SOLN
80.0000 mg | INTRAVENOUS | Status: DC
  Filled 2019-07-10: qty 2

## 2019-07-11 ENCOUNTER — Ambulatory Visit: Payer: Medicare Other | Admitting: Anesthesiology

## 2019-07-11 ENCOUNTER — Encounter
Admission: RE | Disposition: A | Discharge status: Home / Self Care | Payer: Self-pay | Source: Home / Self Care | Attending: Cardiology

## 2019-07-11 ENCOUNTER — Other Ambulatory Visit: Payer: Self-pay

## 2019-07-11 ENCOUNTER — Ambulatory Visit
Admission: RE | Admit: 2019-07-11 | Discharge: 2019-07-11 | Disposition: A | Discharge status: Home / Self Care | Payer: Medicare Other | Attending: Cardiology | Admitting: Cardiology

## 2019-07-11 ENCOUNTER — Encounter: Payer: Self-pay | Admitting: *Deleted

## 2019-07-11 DIAGNOSIS — Z8673 Personal history of transient ischemic attack (TIA), and cerebral infarction without residual deficits: Secondary | ICD-10-CM | POA: Insufficient documentation

## 2019-07-11 DIAGNOSIS — Z7902 Long term (current) use of antithrombotics/antiplatelets: Secondary | ICD-10-CM | POA: Insufficient documentation

## 2019-07-11 DIAGNOSIS — J45909 Unspecified asthma, uncomplicated: Secondary | ICD-10-CM | POA: Diagnosis not present

## 2019-07-11 DIAGNOSIS — K219 Gastro-esophageal reflux disease without esophagitis: Secondary | ICD-10-CM | POA: Diagnosis not present

## 2019-07-11 DIAGNOSIS — Z7982 Long term (current) use of aspirin: Secondary | ICD-10-CM | POA: Diagnosis not present

## 2019-07-11 DIAGNOSIS — Z45010 Encounter for checking and testing of cardiac pacemaker pulse generator [battery]: Secondary | ICD-10-CM | POA: Insufficient documentation

## 2019-07-11 DIAGNOSIS — I495 Sick sinus syndrome: Secondary | ICD-10-CM | POA: Diagnosis not present

## 2019-07-11 DIAGNOSIS — E785 Hyperlipidemia, unspecified: Secondary | ICD-10-CM | POA: Diagnosis not present

## 2019-07-11 DIAGNOSIS — Z79899 Other long term (current) drug therapy: Secondary | ICD-10-CM | POA: Diagnosis not present

## 2019-07-11 HISTORY — PX: PACEMAKER INSERTION: SHX728

## 2019-07-11 LAB — SURGICAL PCR SCREEN
MRSA, PCR: NEGATIVE
Staphylococcus aureus: NEGATIVE

## 2019-07-11 SURGERY — INSERTION, CARDIAC PACEMAKER
Anesthesia: General | Laterality: Left

## 2019-07-11 MED ORDER — CEFAZOLIN SODIUM-DEXTROSE 2-4 GM/100ML-% IV SOLN
2.0000 g | INTRAVENOUS | Status: AC
  Administered 2019-07-11: 2 g via INTRAVENOUS

## 2019-07-11 MED ORDER — LACTATED RINGERS IV SOLN
INTRAVENOUS | Status: DC
  Administered 2019-07-11: 12:00:00 via INTRAVENOUS

## 2019-07-11 MED ORDER — FENTANYL CITRATE (PF) 100 MCG/2ML IJ SOLN
INTRAMUSCULAR | Status: AC
  Filled 2019-07-11: qty 2

## 2019-07-11 MED ORDER — FAMOTIDINE 20 MG PO TABS
ORAL_TABLET | ORAL | Status: AC
  Filled 2019-07-11: qty 1

## 2019-07-11 MED ORDER — PHENYLEPHRINE HCL (PRESSORS) 10 MG/ML IV SOLN
INTRAVENOUS | Status: DC | PRN
  Administered 2019-07-11: 100 ug via INTRAVENOUS

## 2019-07-11 MED ORDER — FENTANYL CITRATE (PF) 100 MCG/2ML IJ SOLN: 25.0000 ug | INTRAMUSCULAR | Status: DC | PRN

## 2019-07-11 MED ORDER — GENTAMICIN SULFATE 40 MG/ML IJ SOLN
INTRAMUSCULAR | Status: AC
  Filled 2019-07-11: qty 2

## 2019-07-11 MED ORDER — LIDOCAINE 1 % OPTIME INJ - NO CHARGE
INTRAMUSCULAR | Status: DC | PRN
  Administered 2019-07-11: 20 mL

## 2019-07-11 MED ORDER — FAMOTIDINE 20 MG PO TABS: 20.0000 mg | ORAL_TABLET | Freq: Once | ORAL | Status: DC

## 2019-07-11 MED ORDER — CEFAZOLIN SODIUM-DEXTROSE 2-4 GM/100ML-% IV SOLN
INTRAVENOUS | Status: AC
  Filled 2019-07-11: qty 100

## 2019-07-11 MED ORDER — PROPOFOL 10 MG/ML IV BOLUS
INTRAVENOUS | Status: DC | PRN
  Administered 2019-07-11: 30 mg via INTRAVENOUS
  Administered 2019-07-11: 10 mg via INTRAVENOUS

## 2019-07-11 MED ORDER — PROPOFOL 500 MG/50ML IV EMUL
INTRAVENOUS | Status: DC | PRN
  Administered 2019-07-11: 50 ug/kg/min via INTRAVENOUS

## 2019-07-11 MED ORDER — SODIUM CHLORIDE 0.9 % IV SOLN: INTRAVENOUS | Status: DC

## 2019-07-11 MED ORDER — PHENYLEPHRINE HCL-NACL 10-0.9 MG/250ML-% IV SOLN
INTRAVENOUS | Status: DC | PRN
  Administered 2019-07-11: 50 ug/min via INTRAVENOUS

## 2019-07-11 MED ORDER — ONDANSETRON HCL 4 MG/2ML IJ SOLN: 4.0000 mg | Freq: Once | INTRAMUSCULAR | Status: DC | PRN

## 2019-07-11 MED ORDER — CEPHALEXIN 250 MG PO CAPS: 500.0000 mg | ORAL_CAPSULE | Freq: Two times a day (BID) | ORAL | 0 refills | Status: DC

## 2019-07-11 MED ORDER — PROPOFOL 10 MG/ML IV BOLUS
INTRAVENOUS | Status: AC
  Filled 2019-07-11: qty 20

## 2019-07-11 MED ORDER — LIDOCAINE HCL (CARDIAC) PF 100 MG/5ML IV SOSY
PREFILLED_SYRINGE | INTRAVENOUS | Status: DC | PRN
  Administered 2019-07-11: 50 mg via INTRAVENOUS

## 2019-07-11 SURGICAL SUPPLY — 37 items
BAG DECANTER FOR FLEXI CONT (MISCELLANEOUS) ×2 IMPLANT
BRUSH SCRUB EZ  4% CHG (MISCELLANEOUS) ×1
BRUSH SCRUB EZ 4% CHG (MISCELLANEOUS) ×1 IMPLANT
CABLE SURG 12 DISP A/V CHANNEL (MISCELLANEOUS) ×1 IMPLANT
CANISTER SUCT 1200ML W/VALVE (MISCELLANEOUS) ×2 IMPLANT
CHLORAPREP W/TINT 26 (MISCELLANEOUS) ×2 IMPLANT
COVER LIGHT HANDLE STERIS (MISCELLANEOUS) ×4 IMPLANT
COVER MAYO STAND REUSABLE (DRAPES) ×2 IMPLANT
COVER WAND RF STERILE (DRAPES) ×2 IMPLANT
DRAPE C-ARM XRAY 36X54 (DRAPES) ×1 IMPLANT
DRSG TEGADERM 4X4.75 (GAUZE/BANDAGES/DRESSINGS) ×2 IMPLANT
DRSG TELFA 4X3 1S NADH ST (GAUZE/BANDAGES/DRESSINGS) ×2 IMPLANT
ELECT REM PT RETURN 9FT ADLT (ELECTROSURGICAL) ×2
ELECTRODE REM PT RTRN 9FT ADLT (ELECTROSURGICAL) ×1 IMPLANT
GLIDEWIRE STIFF .35X180X3 HYDR (WIRE) IMPLANT
GLOVE BIO SURGEON STRL SZ7.5 (GLOVE) ×2 IMPLANT
GLOVE BIO SURGEON STRL SZ8 (GLOVE) ×2 IMPLANT
GOWN STRL REUS W/ TWL LRG LVL3 (GOWN DISPOSABLE) ×1 IMPLANT
GOWN STRL REUS W/ TWL XL LVL3 (GOWN DISPOSABLE) ×1 IMPLANT
GOWN STRL REUS W/TWL LRG LVL3 (GOWN DISPOSABLE) ×1
GOWN STRL REUS W/TWL XL LVL3 (GOWN DISPOSABLE) ×1
IMMOBILIZER SHDR MD LX WHT (SOFTGOODS) IMPLANT
IMMOBILIZER SHDR XL LX WHT (SOFTGOODS) IMPLANT
INTRO PACEMAKR LEAD 9FR 13CM (INTRODUCER)
INTRO PACEMKR SHEATH II 7FR (MISCELLANEOUS)
INTRODUCER PACEMKR LD 9FR 13CM (INTRODUCER) ×1 IMPLANT
INTRODUCER PACEMKR SHTH II 7FR (MISCELLANEOUS) ×1 IMPLANT
IPG PACE AZUR XT DR MRI W1DR01 (Pacemaker) IMPLANT
IV NS 500ML (IV SOLUTION) ×1
IV NS 500ML BAXH (IV SOLUTION) ×1 IMPLANT
KIT TURNOVER KIT A (KITS) ×2 IMPLANT
LABEL OR SOLS (LABEL) ×1 IMPLANT
MARKER SKIN DUAL TIP RULER LAB (MISCELLANEOUS) ×2 IMPLANT
PACE AZURE XT DR MRI W1DR01 (Pacemaker) ×2 IMPLANT
PACK PACE INSERTION (MISCELLANEOUS) ×2 IMPLANT
PAD ONESTEP ZOLL R SERIES ADT (MISCELLANEOUS) ×2 IMPLANT
SUT SILK 0 SH 30 (SUTURE) ×3 IMPLANT

## 2019-07-11 NOTE — Transfer of Care (Signed)
Immediate Anesthesia Transfer of Care Note  Patient: Elijah Cordia Sr.  Procedure(s) Performed: PACEMAKER CHANGEOUT (Left )  Patient Location: PACU  Anesthesia Type:General  Level of Consciousness: awake, alert , oriented and patient cooperative  Airway & Oxygen Therapy: Patient Spontanous Breathing  Post-op Assessment: Report given to RN, Post -op Vital signs reviewed and stable and Patient moving all extremities  Post vital signs: Reviewed and stable  Last Vitals:  Vitals Value Taken Time  BP 123/53 07/11/19 1305  Temp 36.3 C 07/11/19 1305  Pulse 59 07/11/19 1310  Resp 16 07/11/19 1310  SpO2 97 % 07/11/19 1310  Vitals shown include unvalidated device data.  Last Pain:  Vitals:   07/11/19 1305  TempSrc:   PainSc: 0-No pain         Complications: No apparent anesthesia complications

## 2019-07-11 NOTE — Discharge Instructions (Signed)
May remove outer bandage on 07/12/2019.  Leave Steri-Strips on.  Resume Plavix 07/12/2019.  AMBULATORY SURGERY  DISCHARGE INSTRUCTIONS   1) The drugs that you were given will stay in your system until tomorrow so for the next 24 hours you should not:  A) Drive an automobile B) Make any legal decisions C) Drink any alcoholic beverage   2) You may resume regular meals tomorrow.  Today it is better to start with liquids and gradually work up to solid foods.  You may eat anything you prefer, but it is better to start with liquids, then soup and crackers, and gradually work up to solid foods.   3) Please notify your doctor immediately if you have any unusual bleeding, trouble breathing, redness and pain at the surgery site, drainage, fever, or pain not relieved by medication.    4) Additional Instructions:        Please contact your physician with any problems or Same Day Surgery at [phone], Monday through Friday 6 am to 4 pm, or [place] at [facility] number at [phone].

## 2019-07-11 NOTE — Anesthesia Preprocedure Evaluation (Signed)
Anesthesia Evaluation  Patient identified by MRN, date of birth, ID band Patient awake    Reviewed: Allergy & Precautions, NPO status , Patient's Chart, lab work & pertinent test results  History of Anesthesia Complications Negative for: history of anesthetic complications  Airway Mallampati: III       Dental   Pulmonary neg sleep apnea, neg COPD, Not current smoker,           Cardiovascular (-) hypertension+CHF and + DOE  (-) Past MI + pacemaker (-) Valvular Problems/Murmurs     Neuro/Psych neg Seizures TIA   GI/Hepatic Neg liver ROS, GERD  Medicated and Controlled,  Endo/Other  neg diabetes  Renal/GU Renal InsufficiencyRenal disease     Musculoskeletal   Abdominal   Peds  Hematology   Anesthesia Other Findings   Reproductive/Obstetrics                             Anesthesia Physical Anesthesia Plan  ASA: III  Anesthesia Plan: General   Post-op Pain Management:    Induction: Intravenous  PONV Risk Score and Plan: 2 and Propofol infusion and TIVA  Airway Management Planned: Nasal Cannula  Additional Equipment:   Intra-op Plan:   Post-operative Plan:   Informed Consent: I have reviewed the patients History and Physical, chart, labs and discussed the procedure including the risks, benefits and alternatives for the proposed anesthesia with the patient or authorized representative who has indicated his/her understanding and acceptance.       Plan Discussed with:   Anesthesia Plan Comments:         Anesthesia Quick Evaluation

## 2019-07-11 NOTE — Anesthesia Post-op Follow-up Note (Signed)
Anesthesia QCDR form completed.        

## 2019-07-11 NOTE — Anesthesia Postprocedure Evaluation (Signed)
Anesthesia Post Note  Patient: Elijah GAULIN Sr.  Procedure(s) Performed: PACEMAKER CHANGEOUT (Left )  Patient location during evaluation: PACU Anesthesia Type: General Level of consciousness: awake and alert Pain management: pain level controlled Vital Signs Assessment: post-procedure vital signs reviewed and stable Respiratory status: spontaneous breathing and respiratory function stable Cardiovascular status: stable Anesthetic complications: no     Last Vitals:  Vitals:   07/11/19 1355 07/11/19 1426  BP: (!) 126/49 130/64  Pulse: 62 61  Resp: 18 18  Temp: (!) 36.4 C (!) 36.2 C  SpO2: 99% 100%    Last Pain:  Vitals:   07/11/19 1426  TempSrc: Temporal  PainSc: 0-No pain                 KEPHART,WILLIAM K

## 2019-07-11 NOTE — H&P (Signed)
Jump to Section ? Document InformationEncounter DetailsGoalsLab ResultsLast Filed Vital SignsPatient DemographicsPatient InstructionsPlan of TreatmentProceduresProgress NotesReason for ReferralReason for VisitSocial HistoryVisit Diagnoses Elijah Collins Encounter Summary, generated on Dec. 07, 2020December 07, 2020 Printout Information  Document Contents Document Received Date Document Source Organization  Office Visit Dec. 07, 2020December 07, 2020 [name]   Patient Demographics - 85 y.o. Male; born [birth year]  Patient Address Communication Language Race / Ethnicity Marital Status  [address] [place], [place] [address] [phone] [facility]) [email or web address] English (Preferred) White / Not Hispanic or Latino Widowed  Reason for Referral  (Routine) (Routine)  Status Reason Specialty Diagnoses / Procedures Referred By Contact Referred To Contact  Authorized   Diagnoses  Preop examination    Procedures  Prothrombin Time (INR)  Halford Decamp, [place], [place] [address]  Phone: [phone]  Fax: [phone]          Procedure (Routine) Procedure (Routine)  Status Reason Specialty Diagnoses / Procedures Referred By Contact Referred To Contact  Authorized   Diagnoses  Acute diastolic CHF (congestive heart failure) (CMS-HCC)  Shortness of breath    Procedures  Echo complete  Halford Decamp, [place]  [facility]  [place], [place] [address]  Phone: [phone]  Fax: [phone]        Reason for Visit  Reason Comments  Follow-up battery at [place] recently with heart failure/lack of appietite  Fatigue weakness  Edema little  Shortness of Breath with excertion   Encounter Details  Date Type Department Care Team Description  07/06/2019 Office Visit [facility]  [place], [place] [address]  [place], [place], [place]  [place]  [facility]  [place], [place] [address]  [phone]  [phone] ([address]    Halford Decamp, [place]  [facility]  [place],  [address]  [phone]  [phone] (Fax)  Acute diastolic CHF (congestive heart failure) (CMS-HCC) (Primary Dx);  Shortness of breath;  Preop examination;  History of TIA (transient ischemic attack);  Pacemaker at end of battery life   Social History - documented as of this encounter Tobacco Use Types Packs/Day Years Used Date  Never Smoker Cigarettes     Smokeless Tobacco: Never Used      Alcohol Use Drinks/Week oz/Week Comments  No 0 Standard drinks or equivalent  0.0    Sex Assigned at [employer] Date Recorded  Not on file    COVID-19 Exposure Response Date Recorded  In the last month, have you been in contact with someone who was confirmed or suspected to have [place] / COVID-19? No / Unsure 07/06/2019 10:48 AM EST   Last Filed Vital Signs - documented in this encounter Vital Sign Reading Time Taken Comments  Blood Pressure 102/62 07/06/2019 10:58 AM EST   Pulse 65 07/06/2019 10:58 AM EST   Temperature - -   Respiratory Rate - -   Oxygen Saturation 95% 07/06/2019 10:58 AM EST   Inhaled Oxygen Concentration - -   Weight 88.3 kg (194 lb 9.6 oz) 07/06/2019 10:58 AM EST   Height 175.3 cm (5\' 9" ) 07/06/2019 10:58 AM EST   Body Mass Index 28.74 07/06/2019 10:58 AM EST    Patient Instructions - documented in this encounter Patient Instructions Halford Decamp, PA - 07/06/2019 10:45 AM EST   Patient Education    DASH Diet: Care Instructions Your Care  Instructions  The DASH diet is an eating plan that can help lower your blood pressure. DASH stands for Dietary Approaches to Stop Hypertension. Hypertension is high blood pressure. The DASH diet focuses on eating foods that are high in calcium, potassium, and magnesium.  These nutrients can lower blood pressure. The foods that are highest in these nutrients are fruits, vegetables, low-fat dairy products, nuts, seeds, and legumes. But taking calcium, potassium, and magnesium supplements instead of eating foods that are high in those nutrients does not have the same effect. The DASH diet also includes whole grains, fish, and poultry. The DASH diet is one of several lifestyle changes your doctor may recommend to lower your high blood pressure. Your doctor may also want you to decrease the amount of sodium in your diet. Lowering sodium while following the DASH diet can lower blood pressure even further than just the DASH diet alone. Follow-up care is a key part of your treatment and safety. Be sure to make and go to all appointments, and call your doctor if you are having problems. It's also a good idea to know your test results and keep a list of the medicines you take. How can you care for yourself at home? Following the DASH diet  Eat 4 to 5 servings of fruit each day. A serving is 1 medium-sized piece of fruit,  cup chopped or canned fruit, 1/4 cup dried fruit, or 4 ounces ( cup) of fruit juice. Choose fruit more often than fruit juice.  Eat 4 to 5 servings of vegetables each day. A serving is 1 cup of lettuce or raw leafy vegetables,  cup of chopped or cooked vegetables, or 4 ounces ( cup) of vegetable juice. Choose vegetables more often than vegetable juice.  Get 2 to 3 servings of low-fat and fat-free dairy each day. A serving is 8 ounces of milk, 1 cup of yogurt, or 1  ounces of cheese.  Eat 6 to 8 servings of grains each day. A serving is 1 slice of bread, 1 ounce of dry cereal, or  cup of cooked rice, pasta, or cooked cereal. Try to choose whole-grain products as much as possible.  Limit lean meat, poultry, and fish to 2 servings each day. A serving is 3 ounces, about the size of a deck of cards.  Eat 4 to 5 servings of nuts, seeds, and legumes (cooked  dried beans, lentils, and split peas) each week. A serving is 1/3 cup of nuts, 2 tablespoons of seeds, or  cup of cooked beans or peas.  Limit fats and oils to 2 to 3 servings each day. A serving is 1 teaspoon of vegetable oil or 2 tablespoons of salad dressing.  Limit sweets and added sugars to 5 servings or less a week. A serving is 1 tablespoon jelly or jam,  cup sorbet, or 1 cup of lemonade.  Eat less than 2,300 milligrams (mg) of sodium a day. If you limit your sodium to 1,500 mg a day, you can lower your blood pressure even more.  Be aware that all of these are the suggested number of servings for people who eat 1,800 to 2,000 calories a day. Your recommended number of servings may be different if you need more or fewer calories. Tips for success  Start small. Do not try to make dramatic changes to your diet all at once. You might feel that you are missing out on your favorite foods and then be more likely to not follow the plan.  Make small changes, and stick with them. Once those changes become habit, add a few more changes.  Try some of the following: ? Make it a goal to eat a fruit or vegetable at every meal and at snacks. This will make it easy to get the recommended amount of fruits and vegetables each day. ? Try yogurt topped with fruit and nuts for a snack or healthy dessert. ? Add lettuce, tomato, cucumber, and onion to sandwiches. ? Combine a ready-made pizza crust with low-fat mozzarella cheese and lots of vegetable toppings. Try using tomatoes, squash, spinach, broccoli, carrots, cauliflower, and onions. ? Have a variety of cut-up vegetables with a low-fat dip as an appetizer instead of chips and dip. ? Sprinkle sunflower seeds or chopped almonds over salads. Or try adding chopped walnuts or almonds to cooked vegetables. ? Try some vegetarian meals using beans and peas. Add garbanzo or kidney beans to salads. Make burritos and tacos with mashed pinto beans or black  beans. Where can you learn more? Log in to your [facility] My[facility] account at [email or web address] and click on top menu option "Health" then select "Search Medical Library". Enter [phone] in the search box and click the magnify glass to learn more about "DASH Diet: Care Instructions." Current as of: December 16, 2019Content Version: 12.6  2006-2020 [employer].  Care instructions adapted under license by your healthcare professional. If you have questions about a medical condition or this instruction, always ask your healthcare professional. Healthwise, Incorporated disclaims any warranty or liability for your use of this information.     Electronically signed by Ladean Raya, PA at 07/06/2019 11:19 AM EST     Progress Notes - documented in this encounter Ladean Raya, PA - 07/06/2019 10:45 AM EST Formatting of this note might be different from the original. Established Patient Visit   Chief Complaint: Chief Complaint  Patient presents with   Follow-up  battery at [facility] recently with heart failure/lack of appietite   Fatigue  weakness   Edema  little   Shortness of Breath  with excertion  Date of Service: 07/06/2019 Date of Birth: February 21, 1925 PCP: Alan Mulder, MD  History of Present Illness: [name] is a 85 y.o.male patient who returns for   1. Sinus bradycardia status post dual-chamber pacemaker  2. Hyperlipidemia 3. History of transient ischemic attack, 2012, 11/10/2016, 07/12/2018 4. Asthma  The patient had a TIA on 11/10/16, which manifested as confusion and altered mental status. Carotid ultrasound and head CT were unremarkable. 2D echocardiogram on 11/09/16 revealed normal left ventricular function with LVEF 55-60%, and mild mitral and tricuspid regurgitation. 48 hour Holter monitor on 01/14/17 revealed predominant atrial pacing with ventricular sensing. There were occasional premature atrial contractions with  infrequent atrial runs, the longest lasting 7 beats. Pacemaker interrogation on 01/07/17 revealed normal dual-chamber pacemaker function with an underlying rhythm of junctional rhythm at a ventricular rate in the 20s.   The patient experienced recurrent TIA on 07/12/2018, again with confusion and altered mental status. 2D echocardiogram 07/12/2018 revealed normal left ventricular function, with LVEF 55 to 60%. Lexiscan Myoview on 07/30/2015 revealed normal sinus rhythm. Gated scintigraphy revealed normal left ventricular function with LVEF 56%. SPECT imaging revealed normal wall motion with no evidence for scar or ischemia.   The patient presents today for follow-up after recent ER visit, and for discussion of dual-chamber pacemaker generator change-out, which is currently at [facility]. The patient reports an approximate 3-4 year history of progressive exertional shortness of breath.  He has been able to take 2 mile walks on most days, until 1-2 weeks prior to requiring hospitalization due to worsening shortness of breath. He denied experiencing chest pain. He had no peripheral edema. He had no palpitations. He has intermittent dizziness with exertion without presyncope or syncope. Chest xray was negative for acute cardiopulmonary disease. ECG revealed ventricular paced rhythm at a rate of 65 bpm. High-sensitivity troponin was normal x 2. BNP was elevated to 664. He was evaluated by Clarisa Kindredina Hackney, FNP after discharge, and was not started on diuretic, and no changes were made to his medications. The patient does report that he has been eating a lot of TV dinners lately. Pacemaker interrogation on 06/20/2019 revealed [facility] with voltage 2.60V with 99.9% sensing, with 0.1% pacing without evidence of arrhthymias.   The patient has hyperlipidemia, LDL cholesterol 80 on 02/14/2019, currently on atorvastatin which is well tolerated without apparent side effects, and followed by his primary care provider. The patient tries to  follow a low-cholesterol, low-fat diet.  Past Medical and Surgical History  Past Medical History Past Medical History:  Diagnosis Date   Allergic rhinitis   Asthma 01/14/2014   Chronic cough   GERD (gastroesophageal reflux disease)   History of Clostridium difficile   Hyperlipidemia   Sinus bradycardia   Stroke (CMS-HCC)   Past Surgical History He has a past surgical history that includes Insert / replace / remove pacemaker; Hernia repair; Cyst on neck removed; Colonoscopy (05/08/2003); and egd (05/08/2003).   Medications and Allergies  Current Medications  Current Outpatient Medications  Medication Sig Dispense Refill   albuterol 90 mcg/actuation inhaler Inhale 2 inhalations into the lungs every 6 (six) hours as needed for Wheezing 1 Inhaler 3   ALPRAZolam (XANAX) 0.25 MG tablet   aspirin 81 MG chewable tablet Take 81 mg by mouth once daily.   atorvastatin (LIPITOR) 20 MG tablet TAKE ONE TABLET EVERY DAY 90 tablet 3   clopidogreL (PLAVIX) 75 mg tablet TAKE ONE TABLET BY MOUTH EVERY DAY 90 tablet 0   cyanocobalamin (VITAMIN B12) 500 MCG tablet Take 1 tablet (500 mcg total) by mouth once daily 90 tablet 1   melatonin 1 mg tablet Take 1 mg by mouth nightly   montelukast (SINGULAIR) 10 mg tablet Take 1 tablet (10 mg total) by mouth nightly 30 tablet 2   multivitamin tablet Take 1 tablet by mouth once daily.   sertraline (ZOLOFT) 50 MG tablet Take 1 tablet (50 mg total) by mouth once daily 90 tablet 1   No current facility-administered medications for this visit.   Allergies: Patient has no known allergies.  Social and Family History  Social History reports that he has never smoked. He has never used smokeless tobacco. He reports that he does not drink alcohol or use drugs.  Family History Family History  Problem Relation Age of Onset   Stroke Mother   Emphysema Father   Diabetes type II Brother   Obesity Brother   Stroke Sister   Review of Systems    Review of Systems: The patient denies chest pain, with progressive worsening exertional shortness of breath, without orthopnea, paroxysmal nocturnal dyspnea, pedal edema, palpitations, heart racing, presyncope, syncope, without recurrent stroke-like symptoms. Review of 10 Systems is negative except as described above.  Physical Examination   Vitals:BP 102/62   Pulse 65   Ht 175.3 cm (5\' 9" )   Wt 88.3 kg (194 lb 9.6 oz)   SpO2 95%   BMI 28.74 kg/m  Ht:175.3  cm (5\' 9" ) Wt:88.3 kg (194 lb 9.6 oz) surface area is 2.07 meters squared. Body mass index is 28.74 kg/m.  General: Alert and oriented. Well-appearing. No acute distress. HEENT: Pupils equally reactive to light and accomodation  Neck: Supple, no JVD Lungs: Normal effort of breathing; clear to auscultation bilaterally; no wheezes, rales, rhonchi Heart: Regular rate and rhythm. No murmur, rub, or gallop Abdomen: nondistended Extremities: no cyanosis, clubbing, or edema Peripheral Pulses: 2+ radial  Skin: Warm, dry, no diaphoresis  Assessment   85 y.o. male with  1. Acute diastolic CHF (congestive heart failure) (CMS-HCC)  2. Shortness of breath  3. Preop examination  4. History of TIA (transient ischemic attack)  5. Pacemaker at end of battery life   85 year old gentleman status post dual-chamber pacemaker for sick sinus syndrome, currently at [facility]. Patient has a history of multiple TIAs. Carotid ultrasound and head CT were unremarkable. 2D echocardiogram in 07/2018 revealed normal LV function. 48-hour Holter monitor revealed predominant atrial pacing with ventricular sensing. There were occasional premature atrial contractions with infrequent atrial runs, the longest lasting 7 beats, of uncertain clinical significance. Lexiscan Myoview on 07/30/2015 normal left ventricular function with LVEF 56%. The patient was recently evaluated at [facility] ER on 06/22/2019 for 2 week history of progressively worsening exertional shortness of  breath with intermittent associated lightheadedness, without presyncope, orthopnea, peripheral edema, or chest pain. BNP elevated to 664. Symptoms could be due in part to sodium indiscretion.  Plan   1. Continue current medications 2. Counseled patient low sodium diet 3. DASH diet printed instructions given to the patient 4. Continue atorvastatin for hyperlipidemia management 5. 2D echocardiogram  6. Risks and benefits of pacemaker generator change-out were discussed with the patient, and written informed consent was obtained. 7. Return to clinic after change-out, and echo to discuss results.  Orders Placed This Encounter  Procedures   2019 Novel Coronavirus (CoVID-19), NAA - [facility]   Prothrombin Time (INR)   Echo complete   Return in about 1 week (around 07/13/2019) for after generator change out.  I personally performed the service, non-incident to. (WP)   ANNA 14/05/2019, PA-C    Electronically signed by Domenica Reamer, PA at 07/09/2019 9:56 PM EST   Plan of Treatment - documented as of this encounter Upcoming Encounters Upcoming Encounters  Date Type Specialty Care Team Description  07/18/2019 Ancillary Procedure Cardiology [place] G[place], 07/20/2019, MD  [address]  [facility]  [place], [place]  [id number]  [phone] ([phone]    07/24/2019 Office Visit Cardiology 07/26/2019, MD  [address]  [facility]  [place], [place]  [phone]  [phone] (Fax)    09/12/2019 Ancillary Orders Lab 11/10/2019, MD  [address]  [place], [place]  [phone]  [phone] (Fax)    09/19/2019 Office Visit Internal Medicine 09/21/2019, MD  [address]  [place], [place]  [phone]  [phone] ([phone]    09/26/2019 Office Visit Cardiology 09/28/2019, MD  [address]  [facility]  [place], [place]  [phone]  [phone] (Fax)    10/26/2019 Ancillary Orders Lab 10/28/2019, MD  [address]  [place], [place]  [phone]  [phone] (Fax)    11/02/2019 Office Visit Internal Medicine 01/02/2020, MD  [address]  [facility] [place], [place]  [address]  [phone]  [phone] (Fax)     Scheduled Orders Scheduled Orders  Name Type Priority Associated Diagnoses Order Schedule  Echo complete Echocardiography Routine Acute diastolic CHF (congestive heart failure) (CMS-HCC)  Shortness of breath  Ordered: 07/06/2019  2019 Novel Coronavirus (CoVID-19), NAA - [facility] Microbiology Routine Preop examination  Expected: 07/07/2019, Expires: 07/05/2020   Goals - documented as of this encounter Goal Patient Goal Type Associated Problems Recent Progress 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    Lab Results - documented in this encounter  Prothrombin Time (INR) (07/06/2019 11:47 AM EST) Prothrombin Time (INR) (07/06/2019 11:47 AM EST)  Component Value Ref Range Performed At Pathologist Signature  Prothrombin Time 11.8 10.0 - 13.2 Sec [facility] - [facility]   Prothrombin INR 1.0 (L) 2.0 - 3.0 [facility]    Prothrombin Time (INR) (07/06/2019 11:47 AM EST)  Specimen  Blood   Prothrombin Time  (INR) (07/06/2019 11:47 AM EST)  Narrative Performed At  Patients on stable oral anticoagulant therapy, the target therapeutic range for INR is 2.0-3.0 in most cases.    Patients with prosthetic heart valves, the range is 2.5-3.5  [facility] - LAB    Prothrombin Time (INR) (07/06/2019 11:47 AM EST)  Performing Organization Address City/State/Zipcode Phone Number  [facility] - LAB  [address]  [place], [place] [address]      Visit Diagnoses - documented in this encounter Diagnosis  Acute diastolic CHF (congestive heart failure) (CMS-HCC) - Primary   Shortness of breath   Preop examination  Unspecified pre-operative examination   History of TIA (transient ischemic attack)   Pacemaker at end of battery life  Fitting and adjustment of cardiac pacemaker   Images Document Information  Primary Care Provider Other Service Providers Document Coverage Dates  Alan Mulder, MD (Dec. 18, 2019December 18, 2019 - Present) DM: [phone] (Work) [phone] (Fax) [address] [facility] [place], [place] [address] Internal Medicine [facility] [address] [place], [place] [address]  Dec. 03, 2020December 03, 2020   Custodian Organization  [facility] [address] [place], [place] [address]   Encounter Providers Encounter Date  Ladean Raya, [place] (Attending) DM: [phone] (Work) [phone] (Fax) [address] [facility] [place], [place] [address] Cardiovascular Disease Dec. 03, 2020December 03, 2020    Show All Sections

## 2019-07-11 NOTE — Op Note (Signed)
[facility] Cardiology   07/11/2019                     1:07 PM  PATIENT:  Elijah Cordia Sr.    PRE-OPERATIVE DIAGNOSIS:  pacer battery at [facility]  POST-OPERATIVE DIAGNOSIS:  Same  PROCEDURE:  PACEMAKER CHANGEOUT  SURGEON:  Isaias Cowman, MD    ANESTHESIA:     PREOPERATIVE INDICATIONS:  Elijah Mcdiarmid. is a  85 y.o. male with a diagnosis of pacer battery at [facility] who failed conservative measures and elected for surgical management.    The risks benefits and alternatives were discussed with the patient preoperatively including but not limited to the risks of infection, bleeding, cardiopulmonary complications, the need for revision surgery, among others, and the patient was willing to proceed.   OPERATIVE PROCEDURE: The patient was brought to the operating room in a fasting state.  The left pectoral region was prepped and draped in usual sterile manner.  Anesthesia was obtained 1% lidocaine locally.  A 6 cm incision was performed the left pectoral region.  The existing pacemaker generator was retrieved by electrocautery and blunt dissection.  The leads were disconnected and connected to a new MRI compatible dual-chamber rate responsive pacemaker generator ( Medtronic Azure XT DR [id number] ).  The pacemaker pocket was irrigated gentamicin solution.  Pacemaker generator was positioned into the pocket and the pocket was closed with 2-0 and 4-0 Vicryl, respectively.  Steri-Strips and pressure dressing were applied.  Postprocedural interrogation revealed appropriate dual-chamber atrial and ventricular sensing and pacing thresholds.  There were no periprocedural complications.

## 2019-07-12 ENCOUNTER — Encounter: Payer: Self-pay | Admitting: Cardiology

## 2019-07-24 ENCOUNTER — Other Ambulatory Visit: Payer: Medicare Other

## 2019-07-25 ENCOUNTER — Encounter: Payer: Self-pay | Admitting: *Deleted

## 2019-07-26 ENCOUNTER — Other Ambulatory Visit: Payer: Medicare Other

## 2019-08-11 ENCOUNTER — Other Ambulatory Visit: Payer: Medicare Other

## 2019-08-19 ENCOUNTER — Ambulatory Visit: Payer: Medicare Other | Attending: Internal Medicine

## 2019-08-19 DIAGNOSIS — Z23 Encounter for immunization: Secondary | ICD-10-CM

## 2019-08-19 NOTE — Progress Notes (Signed)
   [facility]  Name:  Elijah GUEYE Sr.    MRN: [id number] DOB: 10-06-1924  08/19/2019  Elijah Collins was observed post Covid-19 immunization for 15 minutes without incidence. He was provided with Vaccine Information Sheet and instruction to access the V-Safe system.   Elijah Collins was instructed to call 911 with any severe reactions post vaccine: Marland Kitchen Difficulty breathing  . Swelling of your face and throat  . A fast heartbeat  . A bad rash all over your body  . Dizziness and weakness    Immunizations Administered    Name Date Dose VIS Date Route   Pfizer COVID-19 Vaccine 08/19/2019 12:38 PM 0.3 mL 07/14/2019 Intramuscular   Manufacturer: [employer]   Lot: [id number]   NDC: [address]-6

## 2019-09-09 ENCOUNTER — Ambulatory Visit: Payer: Medicare Other | Attending: Internal Medicine

## 2019-09-09 DIAGNOSIS — Z23 Encounter for immunization: Secondary | ICD-10-CM | POA: Insufficient documentation

## 2019-09-09 NOTE — Progress Notes (Signed)
   [id number]  Name:  Elijah Collins Sr.    MRN: [id number] DOB: 1925/05/12  09/09/2019  [name] was observed post Covid-19 immunization for 30 minutes based on pre-vaccination screening without incidence. He was provided with Vaccine Information Sheet and instruction to access the V-Safe system.   [name] was instructed to call 911 with any severe reactions post vaccine: Marland Kitchen Difficulty breathing  . Swelling of your face and throat  . A fast heartbeat  . A bad rash all over your body  . Dizziness and weakness    Immunizations Administered    Name Date Dose VIS Date Route   Pfizer COVID-19 Vaccine 09/09/2019 12:31 PM 0.3 mL 07/14/2019 Intramuscular   Manufacturer: [employer], [employer]   Lot: [id number]   NDC: [address]-5

## 2020-05-10 ENCOUNTER — Ambulatory Visit: Payer: Medicare Other

## 2021-02-10 ENCOUNTER — Other Ambulatory Visit: Payer: Self-pay

## 2021-02-10 ENCOUNTER — Emergency Department: Payer: Medicare Other

## 2021-02-10 ENCOUNTER — Encounter: Payer: Self-pay | Admitting: Internal Medicine

## 2021-02-10 ENCOUNTER — Observation Stay
Admission: EM | Admit: 2021-02-10 | Discharge: 2021-02-12 | Disposition: A | Discharge status: Home / Self Care | Payer: Medicare Other | Attending: Internal Medicine | Admitting: Internal Medicine

## 2021-02-10 DIAGNOSIS — Y92002 Bathroom of unspecified non-institutional (private) residence single-family (private) house as the place of occurrence of the external cause: Secondary | ICD-10-CM | POA: Insufficient documentation

## 2021-02-10 DIAGNOSIS — Z79899 Other long term (current) drug therapy: Secondary | ICD-10-CM | POA: Diagnosis not present

## 2021-02-10 DIAGNOSIS — W19XXXA Unspecified fall, initial encounter: Secondary | ICD-10-CM

## 2021-02-10 DIAGNOSIS — U071 COVID-19: Secondary | ICD-10-CM | POA: Diagnosis present

## 2021-02-10 DIAGNOSIS — R5081 Fever presenting with conditions classified elsewhere: Secondary | ICD-10-CM

## 2021-02-10 DIAGNOSIS — W1839XA Other fall on same level, initial encounter: Secondary | ICD-10-CM | POA: Diagnosis not present

## 2021-02-10 DIAGNOSIS — I509 Heart failure, unspecified: Secondary | ICD-10-CM | POA: Diagnosis not present

## 2021-02-10 DIAGNOSIS — J45909 Unspecified asthma, uncomplicated: Secondary | ICD-10-CM | POA: Insufficient documentation

## 2021-02-10 DIAGNOSIS — R531 Weakness: Principal | ICD-10-CM

## 2021-02-10 DIAGNOSIS — Z7902 Long term (current) use of antithrombotics/antiplatelets: Secondary | ICD-10-CM | POA: Diagnosis not present

## 2021-02-10 DIAGNOSIS — N183 Chronic kidney disease, stage 3 unspecified: Secondary | ICD-10-CM | POA: Diagnosis not present

## 2021-02-10 DIAGNOSIS — Z950 Presence of cardiac pacemaker: Secondary | ICD-10-CM | POA: Insufficient documentation

## 2021-02-10 DIAGNOSIS — Z8673 Personal history of transient ischemic attack (TIA), and cerebral infarction without residual deficits: Secondary | ICD-10-CM | POA: Diagnosis not present

## 2021-02-10 DIAGNOSIS — I639 Cerebral infarction, unspecified: Secondary | ICD-10-CM | POA: Diagnosis present

## 2021-02-10 DIAGNOSIS — Z7982 Long term (current) use of aspirin: Secondary | ICD-10-CM | POA: Diagnosis not present

## 2021-02-10 LAB — CBC WITH DIFFERENTIAL/PLATELET
Abs Immature Granulocytes: 0.01 10*3/uL (ref 0.00–0.07)
Basophils Absolute: 0 10*3/uL (ref 0.0–0.1)
Basophils Relative: 0 %
Eosinophils Absolute: 0.1 10*3/uL (ref 0.0–0.5)
Eosinophils Relative: 1 %
HCT: 31.3 % — ABNORMAL LOW (ref 39.0–52.0)
Hemoglobin: 10.5 g/dL — ABNORMAL LOW (ref 13.0–17.0)
Immature Granulocytes: 0 %
Lymphocytes Relative: 22 %
Lymphs Abs: 1.6 10*3/uL (ref 0.7–4.0)
MCH: 32.9 pg (ref 26.0–34.0)
MCHC: 33.5 g/dL (ref 30.0–36.0)
MCV: 98.1 fL (ref 80.0–100.0)
Monocytes Absolute: 0.9 10*3/uL (ref 0.1–1.0)
Monocytes Relative: 13 %
Neutro Abs: 4.4 10*3/uL (ref 1.7–7.7)
Neutrophils Relative %: 64 %
Platelets: 150 10*3/uL (ref 150–400)
RBC: 3.19 MIL/uL — ABNORMAL LOW (ref 4.22–5.81)
RDW: 12.6 % (ref 11.5–15.5)
WBC: 7 10*3/uL (ref 4.0–10.5)
nRBC: 0 % (ref 0.0–0.2)

## 2021-02-10 LAB — COMPREHENSIVE METABOLIC PANEL
ALT: 13 U/L (ref 0–44)
AST: 19 U/L (ref 15–41)
Albumin: 3.2 g/dL — ABNORMAL LOW (ref 3.5–5.0)
Alkaline Phosphatase: 21 U/L — ABNORMAL LOW (ref 38–126)
Anion gap: 3 — ABNORMAL LOW (ref 5–15)
BUN: 26 mg/dL — ABNORMAL HIGH (ref 8–23)
CO2: 25 mmol/L (ref 22–32)
Calcium: 8.3 mg/dL — ABNORMAL LOW (ref 8.9–10.3)
Chloride: 111 mmol/L (ref 98–111)
Creatinine, Ser: 1.19 mg/dL (ref 0.61–1.24)
GFR, Estimated: 57 mL/min — ABNORMAL LOW (ref >60)
Glucose, Bld: 108 mg/dL — ABNORMAL HIGH (ref 70–99)
Potassium: 3.8 mmol/L (ref 3.5–5.1)
Sodium: 139 mmol/L (ref 135–145)
Total Bilirubin: 0.4 mg/dL (ref 0.3–1.2)
Total Protein: 6 g/dL — ABNORMAL LOW (ref 6.5–8.1)

## 2021-02-10 LAB — RESP PANEL BY RT-PCR (FLU A&B, COVID) ARPGX2
Influenza A by PCR: NEGATIVE
Influenza B by PCR: NEGATIVE
SARS Coronavirus 2 by RT PCR: POSITIVE — AB

## 2021-02-10 LAB — CK: Total CK: 120 U/L (ref 49–397)

## 2021-02-10 MED ORDER — SODIUM CHLORIDE 0.9 % IV SOLN: 250.0000 mL | INTRAVENOUS | Status: DC | PRN

## 2021-02-10 MED ORDER — SODIUM CHLORIDE 0.9 % IV SOLN
200.0000 mg | Freq: Once | INTRAVENOUS | Status: AC
  Administered 2021-02-10: 15:00:00 200 mg via INTRAVENOUS
  Filled 2021-02-10: qty 40

## 2021-02-10 MED ORDER — ASCORBIC ACID 500 MG PO TABS
500.0000 mg | ORAL_TABLET | Freq: Every day | ORAL | Status: DC
  Administered 2021-02-10 – 2021-02-12 (×3): 500 mg via ORAL
  Filled 2021-02-10 (×3): qty 1

## 2021-02-10 MED ORDER — ALBUTEROL SULFATE HFA 108 (90 BASE) MCG/ACT IN AERS
2.0000 | INHALATION_SPRAY | Freq: Four times a day (QID) | RESPIRATORY_TRACT | Status: DC
  Administered 2021-02-10 – 2021-02-12 (×8): 2 via RESPIRATORY_TRACT
  Filled 2021-02-10: qty 6.7

## 2021-02-10 MED ORDER — ACETAMINOPHEN 500 MG PO TABS: 1000.0000 mg | ORAL_TABLET | Freq: Once | ORAL | Status: DC

## 2021-02-10 MED ORDER — SODIUM CHLORIDE 0.9 % IV SOLN
100.0000 mg | Freq: Every day | INTRAVENOUS | Status: AC
  Administered 2021-02-11 – 2021-02-12 (×2): 100 mg via INTRAVENOUS
  Filled 2021-02-10: qty 100
  Filled 2021-02-10: qty 20

## 2021-02-10 MED ORDER — ADULT MULTIVITAMIN W/MINERALS CH
1.0000 | ORAL_TABLET | Freq: Every day | ORAL | Status: DC
  Administered 2021-02-10 – 2021-02-12 (×3): 1 via ORAL
  Filled 2021-02-10 (×3): qty 1

## 2021-02-10 MED ORDER — SODIUM CHLORIDE 0.9% FLUSH
3.0000 mL | INTRAVENOUS | Status: DC | PRN
  Administered 2021-02-10: 3 mL via INTRAVENOUS

## 2021-02-10 MED ORDER — SODIUM CHLORIDE 0.9% FLUSH
3.0000 mL | Freq: Two times a day (BID) | INTRAVENOUS | Status: DC
  Administered 2021-02-10 – 2021-02-12 (×4): 3 mL via INTRAVENOUS

## 2021-02-10 MED ORDER — ZINC SULFATE 220 (50 ZN) MG PO CAPS
220.0000 mg | ORAL_CAPSULE | Freq: Every day | ORAL | Status: DC
  Administered 2021-02-10 – 2021-02-12 (×3): 220 mg via ORAL
  Filled 2021-02-10 (×3): qty 1

## 2021-02-10 MED ORDER — ENOXAPARIN SODIUM 40 MG/0.4ML IJ SOSY
40.0000 mg | PREFILLED_SYRINGE | INTRAMUSCULAR | Status: DC
  Administered 2021-02-10 – 2021-02-12 (×3): 40 mg via SUBCUTANEOUS
  Filled 2021-02-10 (×3): qty 0.4

## 2021-02-10 MED ORDER — ATORVASTATIN CALCIUM 20 MG PO TABS
20.0000 mg | ORAL_TABLET | Freq: Every day | ORAL | Status: DC
  Administered 2021-02-10 – 2021-02-11 (×2): 20 mg via ORAL
  Filled 2021-02-10 (×2): qty 1

## 2021-02-10 MED ORDER — ONDANSETRON HCL 4 MG PO TABS: 4.0000 mg | ORAL_TABLET | Freq: Four times a day (QID) | ORAL | Status: DC | PRN

## 2021-02-10 MED ORDER — MELATONIN 5 MG PO TABS
5.0000 mg | ORAL_TABLET | Freq: Every day | ORAL | Status: DC
  Administered 2021-02-10 – 2021-02-11 (×2): 5 mg via ORAL
  Filled 2021-02-10 (×3): qty 1

## 2021-02-10 MED ORDER — ONDANSETRON HCL 4 MG/2ML IJ SOLN: 4.0000 mg | Freq: Four times a day (QID) | INTRAMUSCULAR | Status: DC | PRN

## 2021-02-10 MED ORDER — GUAIFENESIN-DM 100-10 MG/5ML PO SYRP
10.0000 mL | ORAL_SOLUTION | ORAL | Status: DC | PRN
  Administered 2021-02-10 – 2021-02-11 (×2): 10 mL via ORAL
  Filled 2021-02-10 (×2): qty 10

## 2021-02-10 MED ORDER — ASPIRIN 81 MG PO CHEW
81.0000 mg | CHEWABLE_TABLET | Freq: Every day | ORAL | Status: DC
  Administered 2021-02-10 – 2021-02-11 (×2): 81 mg via ORAL
  Filled 2021-02-10 (×2): qty 1

## 2021-02-10 MED ORDER — ACETAMINOPHEN 325 MG PO TABS
650.0000 mg | ORAL_TABLET | Freq: Four times a day (QID) | ORAL | Status: DC | PRN
  Administered 2021-02-10 – 2021-02-11 (×2): 650 mg via ORAL
  Filled 2021-02-10 (×3): qty 2

## 2021-02-10 MED ORDER — CYANOCOBALAMIN 500 MCG PO TABS
500.0000 ug | ORAL_TABLET | Freq: Every day | ORAL | Status: DC
  Administered 2021-02-10 – 2021-02-12 (×3): 500 ug via ORAL
  Filled 2021-02-10 (×3): qty 1

## 2021-02-10 MED ORDER — CLOPIDOGREL BISULFATE 75 MG PO TABS
75.0000 mg | ORAL_TABLET | Freq: Every day | ORAL | Status: DC
  Administered 2021-02-10 – 2021-02-12 (×3): 75 mg via ORAL
  Filled 2021-02-10 (×3): qty 1

## 2021-02-10 NOTE — TOC Progression Note (Signed)
Transition of Care (TOC) - Progression Note    Patient Details  Name: Elijah Collins. MRN: [id number] Date of Birth: Feb 10, 1925  Transition of Care [facility]) CM/SW Contact  Caryn Section, RN Phone Number: 02/10/2021, 4:17 PM  Clinical Narrative:   RNCM spoke to patient's daughter.  He lives alone but has someone that helps with household tasks.  Daughter states he normally gets around independently.  He walks 1 mile a day with a cane.  Daughter lives 30 mins away and his neighbors look in on him.  Daughter states patient has had renovations to his come that are accessible for him, including to his restroom and ramps on the entrances to his home.  He has a ranch home, so stairs are not an issue.  Other than if his condition declines, daughter has no concerns about him returning home after discharge.  TOC contact information given, TOC to follow to discharge.         Expected Discharge Plan and Services                                                 Social Determinants of Health (SDOH) Interventions    Readmission Risk Interventions No flowsheet data found.

## 2021-02-10 NOTE — ED Triage Notes (Signed)
Pt presents from home following a fall in the restroom and having an increase in weakness starting last night. Pt denies LOC, CP, or SOB.

## 2021-02-10 NOTE — Evaluation (Signed)
Physical Therapy Evaluation Patient Details Name: Elijah LAMM Sr. MRN: [id number] DOB: Apr 29, 1925 Today's Date: 02/10/2021   History of Present Illness  Per MD notes, pt is a 87 y.o. male with medical history significant for coronary artery disease, history of TIA, status post pacemaker insertion for sinoatrial node dysfunction who presents to the emergency room by EMS for evaluation of generalized weakness and a fall. MD assessment includes COVID-19 viral infection, fall, and CKD stage 3.  Clinical Impression  Pt was pleasant and motivated to participate during the session. Pt reported walking a mile daily with SPC at baseline and is independent with all ADLs. Pt performed bed mobility with supervision and OOB activities with min guard assist. He was overall steady walking with RW but exhibited unsteadiness while walking with HHA grabbing onto the bed and counter as well as drifting left/right but no LOB. Distance was not limited by fatigue but by author. Pt would benefit from further therapy to address balance deficits. Pt will benefit from HHPT upon discharge to safely address deficits listed in patient problem list for decreased caregiver assistance and eventual return to PLOF.       Follow Up Recommendations Home health PT;Supervision for mobility/OOB    Equipment Recommendations  None recommended by PT    Recommendations for Other Services       Precautions / Restrictions Precautions Precautions: Fall Restrictions Weight Bearing Restrictions: No      Mobility  Bed Mobility Overal bed mobility: Independent                  Transfers Overall transfer level: Needs assistance Equipment used: Rolling walker (2 wheeled) Transfers: Sit to/from Stand Sit to Stand: Min guard         General transfer comment: Pt demonstrated some difficulty standing from non-elevated surface requiring 1-2 attempts.  Ambulation/Gait Ambulation/Gait assistance: Min guard Gait  Distance (Feet): 30 Feet Assistive device: Rolling walker (2 wheeled);1 person hand held assist Gait Pattern/deviations: Step-through pattern;Decreased step length - right;Decreased step length - left;Decreased stride length;Drifts right/left;Wide base of support;Trunk flexed Gait velocity: decreased   General Gait Details: Pt overall steady with RW needed verbal cues to maintain upright posture. Pt walked with HHA demonstrating some unsteadiness grabbing on to objects(bed and counter)to maintain balance as well as drifting left/right.  Stairs            Wheelchair Mobility    Modified Rankin (Stroke Patients Only)       Balance Overall balance assessment: Needs assistance Sitting-balance support: No upper extremity supported;Feet supported Sitting balance-Leahy Scale: Good     Standing balance support: Bilateral upper extremity supported;Single extremity supported Standing balance-Leahy Scale: Fair Standing balance comment: Reliance on UEs through walker.                             Pertinent Vitals/Pain Pain Assessment: No/denies pain    Home Living Family/patient expects to be discharged to:: Private residence Living Arrangements: Alone Available Help at Discharge: Family;Other (Comment);Available 24 hours/day (between daughter and CNA) Type of Home: House Home Access: Stairs to enter Entrance Stairs-Rails: Doctor, general practice of Steps: 3 Home Layout: One level;Laundry or work area in [employer] Equipment: Gilmer Mor - single point;Walker - 2 wheels      Prior Function Level of Independence: Independent with assistive device(s)         Comments: 1 fall prior to current fall for this admission; Pt is indepednent using  a SPC only on longer walks. Pt tries to walk 1 mile per day. Pt performs all ADLs independently and has an aide clean the house on weekends.     Hand Dominance   Dominant Hand: Right    Extremity/Trunk Assessment    Upper Extremity Assessment Upper Extremity Assessment: Overall WFL for tasks assessed (Grossly at least 4/5)    Lower Extremity Assessment Lower Extremity Assessment: Overall WFL for tasks assessed (Grossly at least 4/5)    Cervical / Trunk Assessment Cervical / Trunk Assessment: Normal  Communication   Communication: No difficulties  Cognition Arousal/Alertness: Awake/alert Behavior During Therapy: WFL for tasks assessed/performed Overall Cognitive Status: Within Functional Limits for tasks assessed                                        General Comments      Exercises Total Joint Exercises Marching in Standing: AROM;Strengthening;Both;10 reps;Standing Other Exercises Other Exercises: Static sitting 3-5 min for improved trunk control and balance.   Assessment/Plan    PT Assessment Patient needs continued PT services  PT Problem List Decreased strength;Decreased activity tolerance;Decreased balance;Decreased mobility;Decreased knowledge of use of DME;Decreased safety awareness       PT Treatment Interventions DME instruction;Gait training;Stair training;Functional mobility training;Therapeutic activities;Therapeutic exercise;Balance training;Patient/family education    PT Goals (Current goals can be found in the Care Plan section)  Acute Rehab PT Goals Patient Stated Goal: Get back into his baseline routine PT Goal Formulation: With patient Time For Goal Achievement: 02/23/21 Potential to Achieve Goals: Good    Frequency Min 2X/week   Barriers to discharge        Co-evaluation               AM-PAC PT "6 Clicks" Mobility  Outcome Measure Help needed turning from your back to your side while in a flat bed without using bedrails?: None Help needed moving from lying on your back to sitting on the side of a flat bed without using bedrails?: None Help needed moving to and from a bed to a chair (including a wheelchair)?: A Little Help needed  standing up from a chair using your arms (e.g., wheelchair or bedside chair)?: A Little Help needed to walk in hospital room?: A Little Help needed climbing 3-5 steps with a railing? : A Little 6 Click Score: 20    End of Session Equipment Utilized During Treatment: Gait belt Activity Tolerance: Patient tolerated treatment well Patient left: in bed;with call bell/phone within reach;with bed alarm set Nurse Communication: Mobility status PT Visit Diagnosis: Unsteadiness on feet (R26.81);History of falling (Z91.81);Difficulty in walking, not elsewhere classified (R26.2);Muscle weakness (generalized) (M62.81)    Time: 5625-6389 PT Time Calculation (min) (ACUTE ONLY): 36 min   Charges:              Desiree Hane SPT 02/10/21, 5:04 PM

## 2021-02-10 NOTE — ED Provider Notes (Signed)
[facility] Emergency Department Provider Note ____________________________________________   Event Date/Time   First MD Initiated Contact with Patient 02/10/21 [phone]     (approximate)  I have reviewed the triage vital signs and the nursing notes.  HISTORY  Chief Complaint Fall and Weakness   HPI Elijah Collins. is a 87 y.o. malewho presents to the ED for evaluation of fall and generalized weakness.   Chart review indicates hx of TIA, CKD, pacemaker placement due to sinoatrial node dysfunction 2020.   Patient presents to the ED via EMS from home for evaluation of generalized weakness and a fall.  Patient reports that he fell in the bathroom today, mechanical fall where his legs just gave out, and had significant difficulty getting up.  He reports requiring about 20 minutes just to get up off the floor.  He was not certain that he would be able to get up, so he had called 911.  Medics found him shortly after he got up off the floor and transported him to the ED for evaluation.  Patient reports having a "cold" for the past 1 week or so.  He lives at home alone.  He reports generalized weakness and nonproductive cough.   Past Medical History:  Diagnosis Date   Asthma    Cardiac arrhythmia    CHF (congestive heart failure) (HCC)    Hyperlipidemia    Shortness of breath    Stroke [facility])    TIA (transient ischemic attack)     Patient Active Problem List   Diagnosis Date Noted   Healthcare maintenance 05/19/2017   TIA (transient ischemic attack) 11/09/2016   DOE (dyspnea on exertion) 07/09/2016   Pacemaker 02/14/2014   History of TIA (transient ischemic attack) 02/03/2014   Asthma 01/14/2014   CKD (chronic kidney disease), stage III (HCC) 01/14/2014   GERD (gastroesophageal reflux disease) 01/14/2014    Past Surgical History:  Procedure Laterality Date   APPENDECTOMY  2011   [facility]   HERNIA REPAIR Right 10 years ago   [facility]  [facility]   PACEMAKER  IMPLANT     PACEMAKER INSERTION Left 07/11/2019   Procedure: PACEMAKER CHANGEOUT;  Surgeon: Marcina Millard, MD;  Location: [facility] ORS;  Service: Cardiovascular;  Laterality: Left;    Prior to Admission medications   Medication Sig Start Date End Date Taking? Authorizing Provider  aspirin 81 MG chewable tablet Chew 81 mg by mouth at bedtime.    [provider]  atorvastatin (LIPITOR) 20 MG tablet Take 20 mg by mouth at bedtime.    [provider]  cephALEXin (KEFLEX) 250 MG capsule Take 2 capsules (500 mg total) by mouth 2 (two) times daily. 07/11/19   Paraschos, Alexander, MD  clopidogrel (PLAVIX) 75 MG tablet Take 75 mg by mouth daily.    [provider]  Melatonin 1 MG TABS Take 1 mg by mouth at bedtime.     [provider]  Multiple Vitamin (MULTI-VITAMINS) TABS Take 1 tablet by mouth daily.    [provider]  sertraline (ZOLOFT) 50 MG tablet Take 50 mg by mouth daily. 04/27/19   [provider]  vitamin B-12 (CYANOCOBALAMIN) 500 MCG tablet Take 500 mcg by mouth daily.    [provider]    Allergies Patient has no known allergies.  Family History  Problem Relation Age of Onset   CVA Mother    COPD Father     Social History Social History   Tobacco Use   Smoking  status: Never   Smokeless tobacco: Never  Substance Use Topics   Alcohol use: No   Drug use: No    Review of Systems  Constitutional: No fever/chills.  Positive generalized weakness Eyes: No visual changes. ENT: No sore throat. Cardiovascular: Denies chest pain. Respiratory: Denies shortness of breath.  Positive for nonproductive cough. Gastrointestinal: No abdominal pain.  No nausea, no vomiting.  No diarrhea.  No constipation. Genitourinary: Negative for dysuria. Musculoskeletal: Negative for back pain. Skin: Negative for rash. Neurological: Negative for headaches, focal weakness or  numbness.  ____________________________________________   PHYSICAL EXAM:  VITAL SIGNS: Vitals:   02/10/21 0313 02/10/21 0500  BP: 97/83 (!) 97/56  Pulse: 74 60  Resp: 18 15  Temp: (!) 100.9 F (38.3 C)   SpO2: 96% 95%     Constitutional: Alert and oriented. Well appearing and in no acute distress. Eyes: Conjunctivae are normal. PERRL. EOMI. Head: Atraumatic. Nose: No congestion/rhinnorhea. Mouth/Throat: Mucous membranes are moist.  Oropharynx non-erythematous. Neck: No stridor. No cervical spine tenderness to palpation. Cardiovascular: Normal rate, regular rhythm. Grossly normal heart sounds.  Good peripheral circulation. Respiratory: Normal respiratory effort.  No retractions. Lungs CTAB. Gastrointestinal: Soft , nondistended, nontender to palpation. No CVA tenderness. Musculoskeletal: No lower extremity tenderness nor edema.  No joint effusions. No signs of acute trauma. Neurologic:  Normal speech and language. No gross focal neurologic deficits are appreciated.  Cranial nerves II through XII intact 5/5 strength and sensation in all 4 extremities Skin:  Skin is warm, dry and intact. No rash noted. Psychiatric: Mood and affect are normal. Speech and behavior are normal.  ____________________________________________   LABS (all labs ordered are listed, but only abnormal results are displayed)  Labs Reviewed  RESP PANEL BY RT-PCR (FLU A&B, COVID) ARPGX2 - Abnormal; Notable for the following components:      Result Value   SARS Coronavirus 2 by RT PCR POSITIVE (*)    All other components within normal limits  COMPREHENSIVE METABOLIC PANEL - Abnormal; Notable for the following components:   Glucose, Bld 108 (*)    BUN 26 (*)    Calcium 8.3 (*)    Total Protein 6.0 (*)    Albumin 3.2 (*)    Alkaline Phosphatase 21 (*)    GFR, Estimated 57 (*)    Anion gap 3 (*)    All other components within normal limits  CBC WITH DIFFERENTIAL/PLATELET - Abnormal; Notable for the  following components:   RBC 3.19 (*)    Hemoglobin 10.5 (*)    HCT 31.3 (*)    All other components within normal limits  URINALYSIS, COMPLETE (UACMP) WITH MICROSCOPIC   ____________________________________________  12 Lead EKG  Sinus rhythm with PR interval of 242 ms, normal axis and no evidence of acute ischemia. ____________________________________________  RADIOLOGY  ED MD interpretation: 2 view CXR reviewed by me without evidence of acute cardiopulmonary pathology. CT head reviewed by me without evidence of acute intracranial pathology.  Official radiology report(s): DG Chest 2 View  Result Date: 02/10/2021 CLINICAL DATA:  Fever, fall, increasing weakness EXAM: CHEST - 2 VIEW COMPARISON:  Radiograph 07/27/2012 FINDINGS: Pacer pack overlies left chest wall. Leads are directed towards the right atrium and cardiac apex. There has been interval pacer pack replacement from [facility] in 2020. Cardiomediastinal contours are otherwise unremarkable. Chronic asymmetric elevation of left hemidiaphragm. Mild bibasilar scarring and/or atelectasis. No consolidation, features of edema, pneumothorax, or effusion. No acute traumatic findings or worrisome chest wall abnormalities. IMPRESSION: No acute  cardiopulmonary or chest wall abnormality. Chronic basilar scarring and or atelectasis and elevation of the left hemidiaphragm. Electronically Signed   By: Kreg Shropshire M.D.   On: 02/10/2021 03:58   CT Head Wo Contrast  Result Date: 02/10/2021 CLINICAL DATA:  Recent fall with weakness, initial encounter EXAM: CT HEAD WITHOUT CONTRAST TECHNIQUE: Contiguous axial images were obtained from the base of the skull through the vertex without intravenous contrast. COMPARISON:  07/11/2018 FINDINGS: Brain: No evidence of acute infarction, hemorrhage, hydrocephalus, extra-axial collection or mass lesion/mass effect. Chronic atrophic and ischemic changes are again noted. Vascular: No hyperdense vessel or  unexpected calcification. Skull: Normal. Negative for fracture or focal lesion. Sinuses/Orbits: No acute finding. Other: None. IMPRESSION: Chronic atrophic and ischemic changes without acute abnormality. Electronically Signed   By: Alcide Clever M.D.   On: 02/10/2021 03:43    ____________________________________________   PROCEDURES and INTERVENTIONS  Procedure(s) performed (including Critical Care):  Procedures  Medications  acetaminophen (TYLENOL) tablet 1,000 mg (1,000 mg Oral Patient Refused/Not Given 02/10/21 0445)    ____________________________________________   MDM / ED COURSE   Pleasant and quite functional 87 year old male presents to the ED after mechanical fall at home with generalized weakness, found of evidence of COVID-19 and no significant traumatic pathology, requiring medical observation admission.  Presents with low-grade temperature, otherwise stable.  Exam without evidence of significant stigmata of trauma or deformities.  No evidence of distress, neurologic or vascular deficits.  Blood work is benign and imaging shows no acute derangements such as CAP or ICH.  COVID results returns positive, likely the etiology of his weakness and a "cold" that he has been experiencing for the past week or so.  Due to his unsafe discharge plan with him living at home, and his continued weak symptoms, will discuss the case with hospitalist for admission.   Clinical Course as of 02/10/21 0600  Mon Feb 10, 2021  0600 Reassessed.  Patient reports feeling "okay."  Reports continued weakness.  We discussed positive COVID results.  We discussed his home situation and observation admission.  He is agreeable. [DS]    Clinical Course User Index [DS] Delton Prairie, MD    ____________________________________________   FINAL CLINICAL IMPRESSION(S) / ED DIAGNOSES  Final diagnoses:  Generalized weakness  Fall, initial encounter  Fever in other diseases     ED Discharge Orders     None         Dung Prien Katrinka Blazing   Note:  This document was prepared using Dragon voice recognition software and may include unintentional dictation errors.    Delton Prairie, MD 02/10/21 [phone]

## 2021-02-10 NOTE — H&P (Signed)
History and Physical    Elijah Collins NWG:[id number] DOB: 10-29-1924 DOA: 02/10/2021  PCP: Barbette Reichmann, MD   Patient coming from: Home  I have personally briefly reviewed patient's old medical records in [facility] Link  Chief Complaint: Fall  HPI: Elijah Cowin. is a 87 y.o. male with medical history significant for coronary artery disease, history of TIA, status post pacemaker insertion for sinoatrial node dysfunction who presents to the emergency room by EMS for evaluation of generalized weakness and a fall. Patient states that he got up to go to the bathroom and his legs gave out because he was so weak that he fell to the floor.  He was unable to get up.  He denies any loss of consciousness and denied hitting his head on the ground.  He laid on the floor for about 20 minutes and when he realized he was unable to get up he called EMS. Patient states that he has had a dry cough for about 5 days and recently went to visit a friend at the skilled nursing facility a week ago who recently tested positive for the COVID-19 virus.  He also has nasal congestion but denies having any fever, no chills, no myalgias, no anorexia, no abdominal pain, no changes in his bowel habits. His oral intake has but he just complains of feeling very weak. Patient has been vaccinated and is boosted against the COVID-19 virus. Labs show sodium 139, potassium 3.8, chloride 111, bicarb 25, glucose 108, BUN 26, creatinine 1.19, calcium 8.3, alkaline phosphatase 21, albumin 3.2, AST 19, ALT 13, total protein 6.0, white count 7.0, hemoglobin 10.5, hematocrit 31.3, MCV 98.1, RDW 12.6, platelet count 150 Patient's point-of-care SARS coronavirus 2 test is positive CT scan of the head without contrast showed chronic atrophic and ischemic changes without acute abnormality. Chest x-ray reviewed by me shows no acute cardiopulmonary or chest wall abnormality. Chronic basilar scarring and or atelectasis and elevation of  the left hemidiaphragm. Twelve-lead EKG reviewed by me shows sinus rhythm with a prolonged PR interval.  ED Course: Patient is a 87 year old Caucasian male who presents to the ER via EMS for evaluation of generalized weakness and a fall at home.  He is positive for the COVID-19 virus but is vaccinated and boosted. He will be referred to observation status for further evaluation.   Review of Systems: As per HPI otherwise all other systems reviewed and negative.    Past Medical History:  Diagnosis Date   Asthma    Cardiac arrhythmia    CHF (congestive heart failure) (HCC)    Hyperlipidemia    Shortness of breath    Stroke (HCC)    TIA (transient ischemic attack)     Past Surgical History:  Procedure Laterality Date   APPENDECTOMY  2011   [facility]   HERNIA REPAIR Right 10 years ago   [facility]  [facility]   PACEMAKER IMPLANT     PACEMAKER INSERTION Left 07/11/2019   Procedure: PACEMAKER CHANGEOUT;  Surgeon: Marcina Millard, MD;  Location: [facility] ORS;  Service: Cardiovascular;  Laterality: Left;     reports that he has never smoked. He has never used smokeless tobacco. He reports that he does not drink alcohol and does not use drugs.  No Known Allergies  Family History  Problem Relation Age of Onset   CVA Mother    COPD Father       Prior to Admission medications   Medication Sig Start Date End Date Taking? Authorizing  Provider  aspirin 81 MG chewable tablet Chew 81 mg by mouth at bedtime.   Yes [provider]  atorvastatin (LIPITOR) 20 MG tablet Take 20 mg by mouth at bedtime.   Yes [provider]  clopidogrel (PLAVIX) 75 MG tablet Take 75 mg by mouth daily.   Yes [provider]  Melatonin 1 MG TABS Take 5 mg by mouth at bedtime.   Yes [provider]  Multiple Vitamin (MULTI-VITAMINS) TABS Take 1 tablet by mouth daily.   Yes [provider]  vitamin B-12 (CYANOCOBALAMIN) 500 MCG tablet Take 500 mcg by mouth daily.   Yes [provider]  cephALEXin (KEFLEX) 250 MG capsule Take 2 capsules (500 mg total) by mouth 2 (two) times daily. Patient not taking: Reported on 02/10/2021 07/11/19   Marcina MillardParaschos, Alexander, MD  sertraline (ZOLOFT) 50 MG tablet Take 50 mg by mouth daily. Patient not taking: Reported on 02/10/2021 04/27/19   [provider]    Physical Exam: Vitals:   02/10/21 0600 02/10/21 0607 02/10/21 0704 02/10/21 0844  BP: 105/60 105/60 104/69 (!) 100/51  Pulse:  69 77 69  Resp: 19 18 18 16   Temp:   99.2 F (37.3 C) 99.2 F (37.3 C)  TempSrc:  Oral Oral   SpO2:  94% 94% 98%  Weight:      Height:         Vitals:   02/10/21 0600 02/10/21 0607 02/10/21 0704 02/10/21 0844  BP: 105/60 105/60 104/69 (!) 100/51  Pulse:  69 77 69  Resp: 19 18 18 16   Temp:   99.2 F (37.3 C) 99.2 F (37.3 C)  TempSrc:  Oral Oral   SpO2:  94% 94% 98%  Weight:      Height:          Constitutional: Alert and oriented x 3 . Not in any apparent distress HEENT:      Head: Normocephalic and atraumatic.         Eyes: PERLA, EOMI, Conjunctivae are normal. Sclera is non-icteric.       Mouth/Throat: Mucous membranes are moist.       Neck: Supple with no signs of meningismus. Cardiovascular: Regular rate and rhythm. No murmurs, gallops, or rubs. 2+ symmetrical distal pulses are present . No JVD. No LE edema Respiratory: Respiratory effort normal .Lungs sounds clear bilaterally. No wheezes, crackles, or rhonchi.  Gastrointestinal: Soft, non tender, and non distended with positive bowel sounds.  Genitourinary: No CVA tenderness. Musculoskeletal: Nontender with normal range of motion in all extremities. No cyanosis, or erythema of extremities. Neurologic:  Face is symmetric. Moving all extremities. No gross focal neurologic deficits . Skin: Skin is warm, dry.  No rash or ulcers Psychiatric: Mood and affect are normal    Labs on Admission: I have personally reviewed following labs and imaging  studies  CBC: Recent Labs  Lab 02/10/21 0335  WBC 7.0  NEUTROABS 4.4  HGB 10.5*  HCT 31.3*  MCV 98.1  PLT 150   Basic Metabolic Panel: Recent Labs  Lab 02/10/21 0335  NA 139  K 3.8  CL 111  CO2 25  GLUCOSE 108*  BUN 26*  CREATININE 1.19  CALCIUM 8.3*   GFR: Estimated Creatinine Clearance: 40 mL/min (by C-G formula based on SCr of 1.19 mg/dL). Liver Function Tests: Recent Labs  Lab 02/10/21 0335  AST 19  ALT 13  ALKPHOS 21*  BILITOT 0.4  PROT 6.0*  ALBUMIN 3.2*   No results for input(s):  LIPASE, AMYLASE in the last 168 hours. No results for input(s): AMMONIA in the last 168 hours. Coagulation Profile: No results for input(s): INR, PROTIME in the last 168 hours. Cardiac Enzymes: No results for input(s): CKTOTAL, CKMB, CKMBINDEX, TROPONINI in the last 168 hours. BNP (last 3 results) No results for input(s): PROBNP in the last 8760 hours. HbA1C: No results for input(s): HGBA1C in the last 72 hours. CBG: No results for input(s): GLUCAP in the last 168 hours. Lipid Profile: No results for input(s): CHOL, HDL, LDLCALC, TRIG, CHOLHDL, LDLDIRECT in the last 72 hours. Thyroid Function Tests: No results for input(s): TSH, T4TOTAL, FREET4, T3FREE, THYROIDAB in the last 72 hours. Anemia Panel: No results for input(s): VITAMINB12, FOLATE, FERRITIN, TIBC, IRON, RETICCTPCT in the last 72 hours. Urine analysis:    Component Value Date/Time   COLORURINE Yellow 07/27/2012 1905   APPEARANCEUR Clear 07/27/2012 1905   LABSPEC 1.056 07/27/2012 1905   PHURINE 5.0 07/27/2012 1905   GLUCOSEU Negative 07/27/2012 1905   HGBUR Negative 07/27/2012 1905   BILIRUBINUR Negative 07/27/2012 1905   KETONESUR Negative 07/27/2012 1905   PROTEINUR Negative 07/27/2012 1905   NITRITE Negative 07/27/2012 1905   LEUKOCYTESUR Negative 07/27/2012 1905    Radiological Exams on Admission: DG Chest 2 View  Result Date: 02/10/2021 CLINICAL DATA:  Fever, fall, increasing weakness EXAM:  CHEST - 2 VIEW COMPARISON:  Radiograph 07/27/2012 FINDINGS: Pacer pack overlies left chest wall. Leads are directed towards the right atrium and cardiac apex. There has been interval pacer pack replacement from [facility] in 2020. Cardiomediastinal contours are otherwise unremarkable. Chronic asymmetric elevation of left hemidiaphragm. Mild bibasilar scarring and/or atelectasis. No consolidation, features of edema, pneumothorax, or effusion. No acute traumatic findings or worrisome chest wall abnormalities. IMPRESSION: No acute cardiopulmonary or chest wall abnormality. Chronic basilar scarring and or atelectasis and elevation of the left hemidiaphragm. Electronically Signed   By: Kreg Shropshire M.D.   On: 02/10/2021 03:58   CT Head Wo Contrast  Result Date: 02/10/2021 CLINICAL DATA:  Recent fall with weakness, initial encounter EXAM: CT HEAD WITHOUT CONTRAST TECHNIQUE: Contiguous axial images were obtained from the base of the skull through the vertex without intravenous contrast. COMPARISON:  07/11/2018 FINDINGS: Brain: No evidence of acute infarction, hemorrhage, hydrocephalus, extra-axial collection or mass lesion/mass effect. Chronic atrophic and ischemic changes are again noted. Vascular: No hyperdense vessel or unexpected calcification. Skull: Normal. Negative for fracture or focal lesion. Sinuses/Orbits: No acute finding. Other: None. IMPRESSION: Chronic atrophic and ischemic changes without acute abnormality. Electronically Signed   By: Alcide Clever M.D.   On: 02/10/2021 03:43     Assessment/Plan Principal Problem:   Generalized weakness Active Problems:   CKD (chronic kidney disease), stage III (HCC)   COVID-19 virus infection   Fall     COVID-19 viral infection Patient presents to the ER for evaluation of generalized weakness and tested positive for the COVID 19 virus on 02/10/21 Patient was recently exposed to someone who recently tested positive for COVID-19 virus ago. He has  received 2 doses of the COVID-19 vaccine as well as a booster shot We will treat with remdesivir per protocol for 3 days Supportive care with bronchodilator therapy, antitussives and vitamins Monitor serial inflammatory markers    Fall Secondary to generalized weakness Will obtain CK levels Place patient on fall precautions We will request PT evaluation    Stage III chronic kidney disease Renal function is stable    History of CVA/coronary artery disease Continue aspirin and  Plavix Continue high intensity statins    DVT prophylaxis: Lovenox  Code Status: DO NOT RESUSCITATE Family Communication: Called and spoke to patient's daughter and healthcare power of attorney explained and discussed patient's current condition and plan of care.  She verbalizes understanding and agrees with the plan.  CODE STATUS was discussed with patient and he is a DNR. Disposition Plan: Back to previous home environment Consults called: Physical therapy Status: Observation    Jaci Desanto MD [facility]     02/10/2021, 9:28 AM

## 2021-02-11 DIAGNOSIS — R531 Weakness: Secondary | ICD-10-CM | POA: Diagnosis not present

## 2021-02-11 LAB — CBC WITH DIFFERENTIAL/PLATELET
Abs Immature Granulocytes: 0.02 10*3/uL (ref 0.00–0.07)
Basophils Absolute: 0 10*3/uL (ref 0.0–0.1)
Basophils Relative: 1 %
Eosinophils Absolute: 0 10*3/uL (ref 0.0–0.5)
Eosinophils Relative: 1 %
HCT: 34.9 % — ABNORMAL LOW (ref 39.0–52.0)
Hemoglobin: 11.6 g/dL — ABNORMAL LOW (ref 13.0–17.0)
Immature Granulocytes: 0 %
Lymphocytes Relative: 33 %
Lymphs Abs: 1.8 10*3/uL (ref 0.7–4.0)
MCH: 33.3 pg (ref 26.0–34.0)
MCHC: 33.2 g/dL (ref 30.0–36.0)
MCV: 100.3 fL — ABNORMAL HIGH (ref 80.0–100.0)
Monocytes Absolute: 0.9 10*3/uL (ref 0.1–1.0)
Monocytes Relative: 16 %
Neutro Abs: 2.9 10*3/uL (ref 1.7–7.7)
Neutrophils Relative %: 49 %
Platelets: 149 10*3/uL — ABNORMAL LOW (ref 150–400)
RBC: 3.48 MIL/uL — ABNORMAL LOW (ref 4.22–5.81)
RDW: 12.8 % (ref 11.5–15.5)
WBC: 5.7 10*3/uL (ref 4.0–10.5)
nRBC: 0 % (ref 0.0–0.2)

## 2021-02-11 LAB — COMPREHENSIVE METABOLIC PANEL
ALT: 17 U/L (ref 0–44)
AST: 22 U/L (ref 15–41)
Albumin: 3.3 g/dL — ABNORMAL LOW (ref 3.5–5.0)
Alkaline Phosphatase: 24 U/L — ABNORMAL LOW (ref 38–126)
Anion gap: 5 (ref 5–15)
BUN: 28 mg/dL — ABNORMAL HIGH (ref 8–23)
CO2: 28 mmol/L (ref 22–32)
Calcium: 9 mg/dL (ref 8.9–10.3)
Chloride: 107 mmol/L (ref 98–111)
Creatinine, Ser: 1.21 mg/dL (ref 0.61–1.24)
GFR, Estimated: 56 mL/min — ABNORMAL LOW (ref >60)
Glucose, Bld: 100 mg/dL — ABNORMAL HIGH (ref 70–99)
Potassium: 4.5 mmol/L (ref 3.5–5.1)
Sodium: 140 mmol/L (ref 135–145)
Total Bilirubin: 0.7 mg/dL (ref 0.3–1.2)
Total Protein: 6.4 g/dL — ABNORMAL LOW (ref 6.5–8.1)

## 2021-02-11 LAB — C-REACTIVE PROTEIN: CRP: 5.9 mg/dL — ABNORMAL HIGH (ref <1.0)

## 2021-02-11 LAB — PHOSPHORUS: Phosphorus: 3.9 mg/dL (ref 2.5–4.6)

## 2021-02-11 LAB — FERRITIN: Ferritin: 125 ng/mL (ref 24–336)

## 2021-02-11 LAB — D-DIMER, QUANTITATIVE: D-Dimer, Quant: 1.1 ug/mL-FEU — ABNORMAL HIGH (ref 0.00–0.50)

## 2021-02-11 LAB — MAGNESIUM: Magnesium: 2.2 mg/dL (ref 1.7–2.4)

## 2021-02-11 MED ORDER — BENZONATATE 100 MG PO CAPS
100.0000 mg | ORAL_CAPSULE | Freq: Three times a day (TID) | ORAL | Status: DC
  Administered 2021-02-11 – 2021-02-12 (×5): 100 mg via ORAL
  Filled 2021-02-11 (×5): qty 1

## 2021-02-11 NOTE — TOC Progression Note (Signed)
Transition of Care (TOC) - Progression Note    Patient Details  Name: Elijah POTEETE Sr. MRN: [id number] Date of Birth: 05-01-1925  Transition of Care [facility]) CM/SW Contact  Caryn Section, RN Phone Number: 02/11/2021, 10:49 AM  Clinical Narrative:   Therapy recommendation for home health, patient has been accepted by [facility] home health.  Therapy will begin on discharge.         Expected Discharge Plan and Services                                                 Social Determinants of Health (SDOH) Interventions    Readmission Risk Interventions No flowsheet data found.

## 2021-02-11 NOTE — Progress Notes (Signed)
PROGRESS NOTE  Elijah Muta Sr.  DOB: 1925-05-27  PCP: Barbette Reichmann, MD CHE:[id number]  DOA: 02/10/2021  LOS: 0 days  Hospital Day: 2  Chief Complaint  Patient presents with   Fall   Weakness    Brief narrative: Elijah Gironda. is a 87 y.o. male with PMH significant for CAD, history of TIA, SA node dysfunction s/p PPM. Patient presented to the ED on 7/11 for evaluation of generalized weakness and fall, no loss of consciousness.  Also endorsed dry cough for 5 days after visiting her friend who was tested COVID-positive. In the ED, patient had a temperature of 100.9 Labs mostly unremarkable COVID PCR positive. CT scan of the head without contrast showed chronic atrophic and ischemic changes without acute abnormality. Chest x-ray showed no acute cardiopulmonary or chest wall abnormality. Kept in observation under hospitalist service  Subjective: Patient was seen and examined this morning.  Pleasant elderly Caucasian male.  Propped up in bed.  Not in distress.  Feels better.  Continues to have cough a as his biggest problem.  Assessment/Plan: COVID-19 virus infection -Presented with cough, generalized weakness, fall after exposure to patient with COVID-19 -Patient tested positive for COVID-19 in the ED -Chest x-ray negative for infiltrates -Currently on 3-day course of IV remdesivir -Bronchodilators, antitussives.  Add benzonatate  Generalized weakness/fall -PT eval ordered.  CK level normal.   CKD stage III -Creatinine stable Recent Labs    02/10/21 0335 02/11/21 0448  BUN 26* 28*  CREATININE 1.19 1.21   History of CAD, CVA -Continue aspirin, Plavix and statin.   Mobility: Encourage ambulation.  Home health PT recommended Code Status:   Code Status: DNR  Nutritional status: Body mass index is 26.54 kg/m.     Diet:  Diet Order             Diet 2 gram sodium Room service appropriate? Yes; Fluid consistency: Thin  Diet effective now                   DVT prophylaxis:  enoxaparin (LOVENOX) injection 40 mg Start: 02/10/21 1000   Antimicrobials: Remdesivir Fluid: None Consultants: None Family Communication: None at bedside  Status is: Inpatient  Remains inpatient appropriate because: Ongoing treatment for COVID  Dispo: The patient is from: Home              Anticipated d/c is to: Hopefully home in 2 to 3 days              Patient currently is not medically stable to d/c.   Difficult to place patient No     Infusions:   sodium chloride     remdesivir 100 mg in NS 100 mL      Scheduled Meds:  acetaminophen  1,000 mg Oral Once   albuterol  2 puff Inhalation Q6H   vitamin C  500 mg Oral Daily   aspirin  81 mg Oral QHS   atorvastatin  20 mg Oral QHS   clopidogrel  75 mg Oral Daily   enoxaparin (LOVENOX) injection  40 mg Subcutaneous Q24H   melatonin  5 mg Oral QHS   multivitamin with minerals  1 tablet Oral Daily   sodium chloride flush  3 mL Intravenous Q12H   vitamin B-12  500 mcg Oral Daily   zinc sulfate  220 mg Oral Daily    Antimicrobials: Anti-infectives (From admission, onward)    Start     Dose/Rate Route Frequency Ordered Stop  02/11/21 1000  remdesivir 100 mg in sodium chloride 0.9 % 100 mL IVPB       See Hyperspace for full Linked Orders Report.   100 mg 200 mL/hr over 30 Minutes Intravenous Daily 02/10/21 0903 02/13/21 0959   02/10/21 1000  remdesivir 200 mg in sodium chloride 0.9% 250 mL IVPB       See Hyperspace for full Linked Orders Report.   200 mg 580 mL/hr over 30 Minutes Intravenous Once 02/10/21 0903 02/10/21 1546       PRN meds: sodium chloride, acetaminophen, guaiFENesin-dextromethorphan, ondansetron **OR** ondansetron (ZOFRAN) IV, sodium chloride flush   Objective: Vitals:   02/11/21 0542 02/11/21 0729  BP: 109/71 107/64  Pulse: 71 63  Resp: 18 16  Temp: 98.7 F (37.1 C) 98.2 F (36.8 C)  SpO2: 95% 92%   No intake or output data in the 24 hours ending 02/11/21 0847 Filed  Weights   02/10/21 0314  Weight: 83.9 kg   Weight change:  Body mass index is 26.54 kg/m.   Physical Exam: General exam: Pleasant, elderly Caucasian male.  Not in distress Skin: No rashes, lesions or ulcers. HEENT: Atraumatic, normocephalic, no obvious bleeding Lungs: Clear to auscultation bilaterally. CVS: Regular rate and rhythm, no murmur GI/Abd soft, nontender, nondistended, bowel sound present CNS: Alert, awake, oriented x3 Psychiatry: Mood appropriate Extremities: No pedal edema, no calf tenderness  Data Review: I have personally reviewed the laboratory data and studies available.  Recent Labs  Lab 02/10/21 0335 02/11/21 0448  WBC 7.0 5.7  NEUTROABS 4.4 2.9  HGB 10.5* 11.6*  HCT 31.3* 34.9*  MCV 98.1 100.3*  PLT 150 149*   Recent Labs  Lab 02/10/21 0335 02/11/21 0448  NA 139 140  K 3.8 4.5  CL 111 107  CO2 25 28  GLUCOSE 108* 100*  BUN 26* 28*  CREATININE 1.19 1.21  CALCIUM 8.3* 9.0  MG  --  2.2  PHOS  --  3.9    F/u labs ordered Unresulted Labs (From admission, onward)     Start     Ordered   02/17/21 0500  Creatinine, serum  (enoxaparin (LOVENOX)    CrCl >/= 30 ml/min)  Weekly,   TIMED     Comments: while on enoxaparin therapy    02/10/21 0903   02/11/21 0500  CBC with Differential/Platelet  Daily,   R      02/10/21 0903   02/11/21 0500  Comprehensive metabolic panel  Daily,   R      02/10/21 0903   02/11/21 0500  C-reactive protein  Daily,   R      02/10/21 0903   02/11/21 0500  D-dimer, quantitative  Daily,   R      02/10/21 0903   02/11/21 0500  Ferritin  Daily,   R      02/10/21 0903   02/11/21 0500  Magnesium  Daily,   R      02/10/21 0903   02/11/21 0500  Phosphorus  Daily,   R      02/10/21 0903   02/10/21 0318  Urinalysis, Complete w Microscopic  Once,   STAT        02/10/21 0317            Signed, Lorin Glass, MD [facility] 02/11/2021

## 2021-02-12 DIAGNOSIS — R531 Weakness: Secondary | ICD-10-CM | POA: Diagnosis not present

## 2021-02-12 LAB — CBC WITH DIFFERENTIAL/PLATELET
Abs Immature Granulocytes: 0.03 10*3/uL (ref 0.00–0.07)
Basophils Absolute: 0 10*3/uL (ref 0.0–0.1)
Basophils Relative: 0 %
Eosinophils Absolute: 0.2 10*3/uL (ref 0.0–0.5)
Eosinophils Relative: 4 %
HCT: 33.6 % — ABNORMAL LOW (ref 39.0–52.0)
Hemoglobin: 11.3 g/dL — ABNORMAL LOW (ref 13.0–17.0)
Immature Granulocytes: 1 %
Lymphocytes Relative: 32 %
Lymphs Abs: 1.8 10*3/uL (ref 0.7–4.0)
MCH: 33.3 pg (ref 26.0–34.0)
MCHC: 33.6 g/dL (ref 30.0–36.0)
MCV: 99.1 fL (ref 80.0–100.0)
Monocytes Absolute: 1 10*3/uL (ref 0.1–1.0)
Monocytes Relative: 18 %
Neutro Abs: 2.5 10*3/uL (ref 1.7–7.7)
Neutrophils Relative %: 45 %
Platelets: 150 10*3/uL (ref 150–400)
RBC: 3.39 MIL/uL — ABNORMAL LOW (ref 4.22–5.81)
RDW: 12.6 % (ref 11.5–15.5)
WBC: 5.5 10*3/uL (ref 4.0–10.5)
nRBC: 0 % (ref 0.0–0.2)

## 2021-02-12 LAB — PHOSPHORUS: Phosphorus: 4.3 mg/dL (ref 2.5–4.6)

## 2021-02-12 LAB — COMPREHENSIVE METABOLIC PANEL
ALT: 16 U/L (ref 0–44)
AST: 20 U/L (ref 15–41)
Albumin: 3.1 g/dL — ABNORMAL LOW (ref 3.5–5.0)
Alkaline Phosphatase: 21 U/L — ABNORMAL LOW (ref 38–126)
Anion gap: 4 — ABNORMAL LOW (ref 5–15)
BUN: 35 mg/dL — ABNORMAL HIGH (ref 8–23)
CO2: 27 mmol/L (ref 22–32)
Calcium: 8.6 mg/dL — ABNORMAL LOW (ref 8.9–10.3)
Chloride: 107 mmol/L (ref 98–111)
Creatinine, Ser: 1.31 mg/dL — ABNORMAL HIGH (ref 0.61–1.24)
GFR, Estimated: 51 mL/min — ABNORMAL LOW (ref >60)
Glucose, Bld: 97 mg/dL (ref 70–99)
Potassium: 4.2 mmol/L (ref 3.5–5.1)
Sodium: 138 mmol/L (ref 135–145)
Total Bilirubin: 0.6 mg/dL (ref 0.3–1.2)
Total Protein: 5.9 g/dL — ABNORMAL LOW (ref 6.5–8.1)

## 2021-02-12 LAB — C-REACTIVE PROTEIN: CRP: 4.8 mg/dL — ABNORMAL HIGH (ref <1.0)

## 2021-02-12 LAB — MAGNESIUM: Magnesium: 2.2 mg/dL (ref 1.7–2.4)

## 2021-02-12 LAB — D-DIMER, QUANTITATIVE: D-Dimer, Quant: 0.96 ug/mL-FEU — ABNORMAL HIGH (ref 0.00–0.50)

## 2021-02-12 LAB — FERRITIN: Ferritin: 135 ng/mL (ref 24–336)

## 2021-02-12 MED ORDER — GUAIFENESIN-DM 100-10 MG/5ML PO SYRP: 10.0000 mL | ORAL_SOLUTION | ORAL | 0 refills | Status: DC | PRN

## 2021-02-12 MED ORDER — BENZONATATE 100 MG PO CAPS: 100.0000 mg | ORAL_CAPSULE | Freq: Three times a day (TID) | ORAL | 0 refills | Status: AC

## 2021-02-12 NOTE — Discharge Summary (Signed)
Physician Discharge Summary  Elijah Collins VWU:[id number] DOB: 1924-12-22 DOA: 02/10/2021  PCP: Barbette Reichmann, MD  Admit date: 02/10/2021 Discharge date: 02/12/2021  Admitted From: Home Discharge disposition: Home with home health PT   Code Status: DNR  Diet Recommendation: Cardiac diet  Discharge Diagnosis:   Principal Problem:   Generalized weakness Active Problems:   CKD (chronic kidney disease), stage III (HCC)   COVID-19 virus infection   Fall   Stroke [facility])    Chief Complaint  Patient presents with   Fall   Weakness    Brief narrative: Elijah Collins. is a 87 y.o. male with PMH significant for CAD, history of TIA, SA node dysfunction s/p PPM. Patient presented to the ED on 7/11 for evaluation of generalized weakness and fall, no loss of consciousness.  Also endorsed dry cough for 5 days after visiting her friend who was tested COVID-positive. In the ED, patient had a temperature of 100.9 Labs mostly unremarkable COVID PCR positive. CT scan of the head without contrast showed chronic atrophic and ischemic changes without acute abnormality. Chest x-ray showed no acute cardiopulmonary or chest wall abnormality. Admitted to hospitalist service See below for details  Subjective: Patient was seen and examined this morning.   Propped up in bed.  Not in distress.  Breathing okay.  Cough improving.  Feels stronger than at presentation.  Feels confident enough to go home to live independent again.  Hospital course: COVID-19 virus infection -Presented with cough, generalized weakness, fall after exposure to patient with COVID-19 -Patient tested positive for COVID-19 in the ED -Chest x-ray negative for infiltrates -Completed 3-day course of IV remdesivir -Cough improving with benzonatate. -Feels comfortable enough to go home today.  Continue benzonatate for cough.  Total of 10 days of isolation recommended.  Generalized weakness/fall -PT eval obtained.  Home  health PT recommended.  CK level normal.   CKD stage III -Creatinine stable.  Encourage oral hydration. Recent Labs    02/10/21 0335 02/11/21 0448 02/12/21 0435  BUN 26* 28* 35*  CREATININE 1.19 1.21 1.31*   History of CAD, CVA -Continue aspirin, Plavix and statin.   Wound care: Incision (Closed) 07/11/19 Chest Left (Active)  Date First Assessed/Time First Assessed: 07/11/19 1238   Location: Chest  Location Orientation: Left    Assessments 07/11/2019  1:05 PM 07/11/2019  1:56 PM  Dressing Clean;Dry;Intact Clean;Dry;Intact     No Linked orders to display    Discharge Exam:   Vitals:   02/12/21 0056 02/12/21 0521 02/12/21 0751 02/12/21 1139  BP: (!) 95/57 103/66 (!) 101/56 100/69  Pulse: 60 72 67 63  Resp: Temp: 97.6 F (36.4 C) 98.7 F (37.1 C) 97.8 F (36.6 C) 97.8 F (36.6 C)  TempSrc:      SpO2: 96% 97% 96% 97%  Weight:      Height:        Body mass index is 26.54 kg/m.  General exam: Pleasant, elderly Caucasian male.  Not in physical distress Skin: No rashes, lesions or ulcers. HEENT: Atraumatic, normocephalic, no obvious bleeding Lungs: Clear to auscultation bilaterally CVS: Regular rate and rhythm, no murmur GI/Abd soft, nontender, nondistended, bowel sound present CNS: Alert, awake abound x3 Psychiatry: Mood appropriate Extremities: No pedal edema, no calf tenderness  Follow ups:   Discharge Instructions     Diet - low sodium heart healthy   Complete by: As directed    Increase activity slowly   Complete by: As  directed        Follow-up Information     Barbette Reichmann, MD Follow up.   Specialty: Internal Medicine Contact information: [address] [place] [address] [phone]         Barbette Reichmann, MD Follow up.   Specialty: Internal Medicine Contact information: [address] [place] [address] [phone]                  Recommendations for Outpatient Follow-Up:   Follow-up with PCP  Discharge Instructions:  Follow with Primary MD Barbette Reichmann, MD in 7 days   Get CBC/BMP checked in next visit within 1 week by PCP or SNF MD ( we routinely change or add medications that can affect your baseline labs and fluid status, therefore we recommend that you get the mentioned basic workup next visit with your PCP, your PCP may decide not to get them or add new tests based on their clinical decision)  On your next visit with your PCP, please Get Medicines reviewed and adjusted.  Please request your PCP  to go over all Hospital Tests and Procedure/Radiological results at the follow up, please get all Hospital records sent to your Prim MD by signing hospital release before you go home.  Activity: As tolerated with Full fall precautions use walker/cane & assistance as needed  For Heart failure patients - Check your Weight same time everyday, if you gain over 2 pounds, or you develop in leg swelling, experience more shortness of breath or chest pain, call your Primary MD immediately. Follow Cardiac Low Salt Diet and 1.5 lit/day fluid restriction.  If you have smoked or chewed Tobacco in the last 2 yrs please stop smoking, stop any regular Alcohol  and or any Recreational drug use.  If you experience worsening of your admission symptoms, develop shortness of breath, life threatening emergency, suicidal or homicidal thoughts you must seek medical attention immediately by calling 911 or calling your MD immediately  if symptoms less severe.  You Must read complete instructions/literature along with all the possible adverse reactions/side effects for all the Medicines you take and that have been prescribed to you. Take any new Medicines after you have completely understood and accpet all the possible adverse reactions/side effects.   Do not drive, operate heavy machinery, perform activities at heights, swimming or  participation in water activities or provide baby sitting services if your were admitted for syncope or siezures until you have seen by Primary MD or a Neurologist and advised to do so again.  Do not drive when taking Pain medications.  Do not take more than prescribed Pain, Sleep and Anxiety Medications  Wear Seat belts while driving.   Please note You were cared for by a hospitalist during your hospital stay. If you have any questions about your discharge medications or the care you received while you were in the hospital after you are discharged, you can call the unit and asked to speak with the hospitalist on call if the hospitalist that took care of you is not available. Once you are discharged, your primary care physician will handle any further medical issues. Please note that NO REFILLS for any discharge medications will be authorized once you are discharged, as it is imperative that you return to your primary care physician (or establish a relationship with a primary care physician if you do not have one) for your aftercare needs so that they can reassess your need  for medications and monitor your lab values.    Time coordinating discharge: 35 minutes  Allergies as of 02/12/2021   No Known Allergies      Medication List     STOP taking these medications    cephALEXin 250 MG capsule Commonly known as: Keflex   sertraline 50 MG tablet Commonly known as: ZOLOFT       TAKE these medications    aspirin 81 MG chewable tablet Chew 81 mg by mouth at bedtime.   atorvastatin 20 MG tablet Commonly known as: LIPITOR Take 20 mg by mouth at bedtime.   benzonatate 100 MG capsule Commonly known as: TESSALON Take 1 capsule (100 mg total) by mouth 3 (three) times daily for 7 days.   clopidogrel 75 MG tablet Commonly known as: PLAVIX Take 75 mg by mouth daily.   guaiFENesin-dextromethorphan 100-10 MG/5ML syrup Commonly known as: ROBITUSSIN DM Take 10 mLs by mouth every 4  (four) hours as needed for cough.   melatonin 1 MG Tabs tablet Take 5 mg by mouth at bedtime.   Multi-Vitamins Tabs Take 1 tablet by mouth daily.   vitamin B-12 500 MCG tablet Commonly known as: CYANOCOBALAMIN Take 500 mcg by mouth daily.        The results of significant diagnostics from this hospitalization (including imaging, microbiology, ancillary and laboratory) are listed below for reference.    Procedures and Diagnostic Studies:   DG Chest 2 View  Result Date: 02/10/2021 CLINICAL DATA:  Fever, fall, increasing weakness EXAM: CHEST - 2 VIEW COMPARISON:  Radiograph 07/27/2012 FINDINGS: Pacer pack overlies left chest wall. Leads are directed towards the right atrium and cardiac apex. There has been interval pacer pack replacement from [facility] in 2020. Cardiomediastinal contours are otherwise unremarkable. Chronic asymmetric elevation of left hemidiaphragm. Mild bibasilar scarring and/or atelectasis. No consolidation, features of edema, pneumothorax, or effusion. No acute traumatic findings or worrisome chest wall abnormalities. IMPRESSION: No acute cardiopulmonary or chest wall abnormality. Chronic basilar scarring and or atelectasis and elevation of the left hemidiaphragm. Electronically Signed   By: Kreg Shropshire M.D.   On: 02/10/2021 03:58   CT Head Wo Contrast  Result Date: 02/10/2021 CLINICAL DATA:  Recent fall with weakness, initial encounter EXAM: CT HEAD WITHOUT CONTRAST TECHNIQUE: Contiguous axial images were obtained from the base of the skull through the vertex without intravenous contrast. COMPARISON:  07/11/2018 FINDINGS: Brain: No evidence of acute infarction, hemorrhage, hydrocephalus, extra-axial collection or mass lesion/mass effect. Chronic atrophic and ischemic changes are again noted. Vascular: No hyperdense vessel or unexpected calcification. Skull: Normal. Negative for fracture or focal lesion. Sinuses/Orbits: No acute finding. Other: None. IMPRESSION:  Chronic atrophic and ischemic changes without acute abnormality. Electronically Signed   By: Alcide Clever M.D.   On: 02/10/2021 03:43     Labs:   Basic Metabolic Panel: Recent Labs  Lab 02/10/21 0335 02/11/21 0448 02/12/21 0435  NA 139 140 138  K 3.8 4.5 4.2  CL 111 107 107  CO2 25 28 27   GLUCOSE 108* 100* 97  BUN 26* 28* 35*  CREATININE 1.19 1.21 1.31*  CALCIUM 8.3* 9.0 8.6*  MG  --  2.2 2.2  PHOS  --  3.9 4.3   GFR Estimated Creatinine Clearance: 36.4 mL/min (A) (by C-G formula based on SCr of 1.31 mg/dL (H)). Liver Function Tests: Recent Labs  Lab 02/10/21 0335 02/11/21 0448 02/12/21 0435  AST 19 22 20   ALT 13 17 16   ALKPHOS 21* 24* 21*  BILITOT  0.4 0.7 0.6  PROT 6.0* 6.4* 5.9*  ALBUMIN 3.2* 3.3* 3.1*   No results for input(s): LIPASE, AMYLASE in the last 168 hours. No results for input(s): AMMONIA in the last 168 hours. Coagulation profile No results for input(s): INR, PROTIME in the last 168 hours.  CBC: Recent Labs  Lab 02/10/21 0335 02/11/21 0448 02/12/21 0435  WBC 7.0 5.7 5.5  NEUTROABS 4.4 2.9 2.5  HGB 10.5* 11.6* 11.3*  HCT 31.3* 34.9* 33.6*  MCV 98.1 100.3* 99.1  PLT 150 149* 150   Cardiac Enzymes: Recent Labs  Lab 02/10/21 0937  CKTOTAL 120   BNP: Invalid input(s): POCBNP CBG: No results for input(s): GLUCAP in the last 168 hours. D-Dimer Recent Labs    02/11/21 0448 02/12/21 0435  DDIMER 1.10* 0.96*   Hgb A1c No results for input(s): HGBA1C in the last 72 hours. Lipid Profile No results for input(s): CHOL, HDL, LDLCALC, TRIG, CHOLHDL, LDLDIRECT in the last 72 hours. Thyroid function studies No results for input(s): TSH, T4TOTAL, T3FREE, THYROIDAB in the last 72 hours.  Invalid input(s): FREET3 Anemia work up Recent Labs    02/11/21 0448 02/12/21 0435  FERRITIN 125 135   Microbiology Recent Results (from the past 240 hour(s))  Resp Panel by RT-PCR (Flu A&B, Covid) Nasopharyngeal Swab     Status: Abnormal    Collection Time: 02/10/21  3:35 AM   Specimen: Nasopharyngeal Swab; Nasopharyngeal(NP) swabs in vial transport medium  Result Value Ref Range Status   SARS Coronavirus 2 by RT PCR POSITIVE (A) NEGATIVE Final    Comment: RESULT CALLED TO, READ BACK BY AND VERIFIED WITH: MELANIE FOWLER @ 0530 02/10/2021 [facility]  (NOTE) SARS-CoV-2 target nucleic acids are DETECTED.  The SARS-CoV-2 RNA is generally detectable in upper respiratory specimens during the acute phase of infection. Positive results are indicative of the presence of the identified virus, but do not rule out bacterial infection or co-infection with other pathogens not detected by the test. Clinical correlation with patient history and other diagnostic information is necessary to determine patient infection status. The expected result is Negative.  Fact Sheet for Patients: BloggerCourse.comhttps://www.fda.gov/media/152166/download  Fact Sheet for Healthcare Providers: SeriousBroker.ithttps://www.fda.gov/media/152162/download  This test is not yet approved or cleared by the [place] FDA and  has been authorized for detection and/or diagnosis of SARS-CoV-2 by FDA under an Emergency Use Authorization (EUA).  This EUA will remain in effect (meaning this test ca n be used) for the duration of  the COVID-19 declaration under Section 564(b)(1) of the Act, 21 U.S.C. section 360bbb-3(b)(1), unless the authorization is terminated or revoked sooner.     Influenza A by PCR NEGATIVE NEGATIVE Final   Influenza B by PCR NEGATIVE NEGATIVE Final    Comment: (NOTE) The Xpert Xpress SARS-CoV-2/FLU/RSV plus assay is intended as an aid in the diagnosis of influenza from Nasopharyngeal swab specimens and should not be used as a sole basis for treatment. Nasal washings and aspirates are unacceptable for Xpert Xpress SARS-CoV-2/FLU/RSV testing.  Fact Sheet for Patients: BloggerCourse.comhttps://www.fda.gov/media/152166/download  Fact Sheet for Healthcare  Providers: SeriousBroker.ithttps://www.fda.gov/media/152162/download  This test is not yet approved or cleared by the [place] FDA and has been authorized for detection and/or diagnosis of SARS-CoV-2 by FDA under an Emergency Use Authorization (EUA). This EUA will remain in effect (meaning this test can be used) for the duration of the COVID-19 declaration under Section 564(b)(1) of the Act, 21 U.S.C. section 360bbb-3(b)(1), unless the authorization is terminated or revoked.  Performed at [facility],  [address] [place], [place] [address]      Signed: Lorin Glass  [facility] 02/12/2021, 1:08 PM

## 2021-02-12 NOTE — Plan of Care (Signed)

## 2021-02-12 NOTE — TOC Transition Note (Signed)
Transition of Care [facility]) - CM/SW Discharge Note   Patient Details  Name: Elijah MOEDE Sr. MRN: [id number] Date of Birth: Jun 16, 1925  Transition of Care [facility]) CM/SW Contact:  Caryn Section, RN Phone Number: 02/12/2021, 2:13 PM   Clinical Narrative:   Patient set up with [facility] home health for PT OT aide.  Discharging today.          Patient Goals and CMS Choice        Discharge Placement                       Discharge Plan and Services                                     Social Determinants of Health (SDOH) Interventions     Readmission Risk Interventions No flowsheet data found.

## 2022-01-13 ENCOUNTER — Other Ambulatory Visit
Admission: RE | Admit: 2022-01-13 | Discharge: 2022-01-13 | Disposition: A | Discharge status: Home / Self Care | Payer: Medicare Other | Source: Ambulatory Visit | Attending: Physician Assistant | Admitting: Physician Assistant

## 2022-01-13 DIAGNOSIS — R0609 Other forms of dyspnea: Secondary | ICD-10-CM | POA: Insufficient documentation

## 2022-01-13 DIAGNOSIS — Z79899 Other long term (current) drug therapy: Secondary | ICD-10-CM | POA: Insufficient documentation

## 2022-01-13 LAB — BRAIN NATRIURETIC PEPTIDE: B Natriuretic Peptide: 65.7 pg/mL (ref 0.0–100.0)

## 2022-04-03 IMAGING — CT CT HEAD W/O CM
4 series · 17 of 47 positions shown, 19 images · non-contrast
Comparison: 07/11/2018

CLINICAL DATA: Recent fall with weakness, initial encounter

EXAM:
CT HEAD WITHOUT CONTRAST
TECHNIQUE: Contiguous axial images were obtained from the base of the skull
through the vertex without intravenous contrast.

[Series 2: head wo · axial · 0.45mm/px · z∈[-110,+15]mm · 7 of 35 slices shown, 9 images]
[im 5/35  brain]
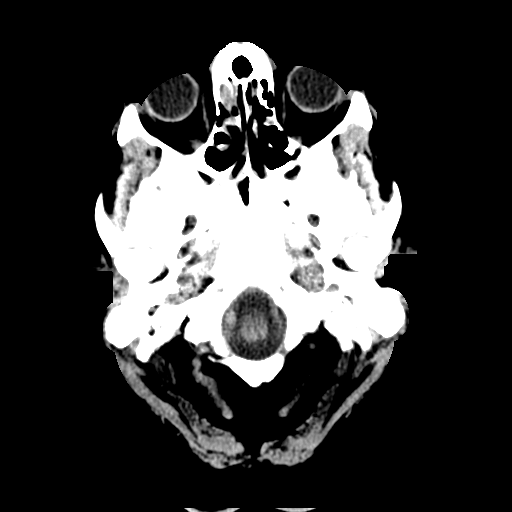
[im 5/35  bone]
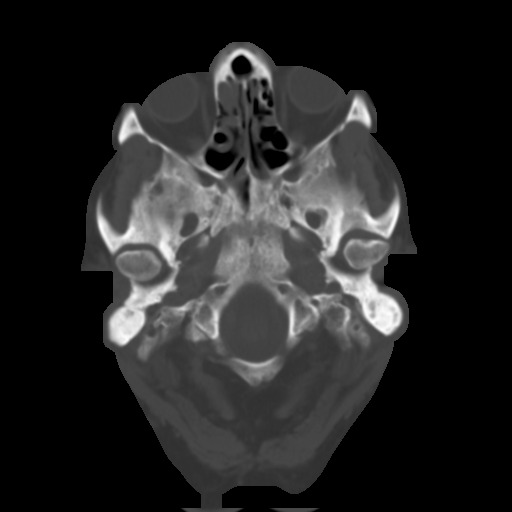
[im 9/35  brain]
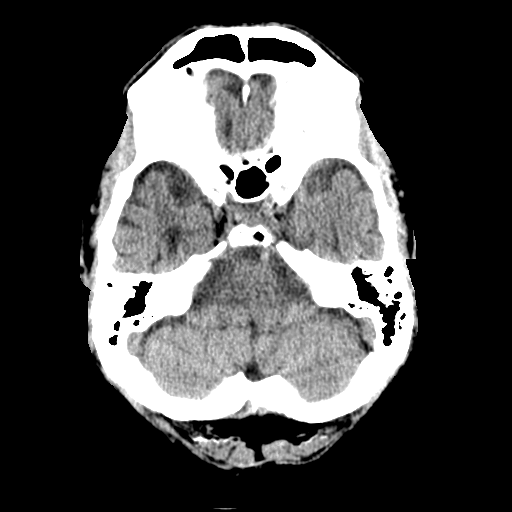
[im 13/35  brain]
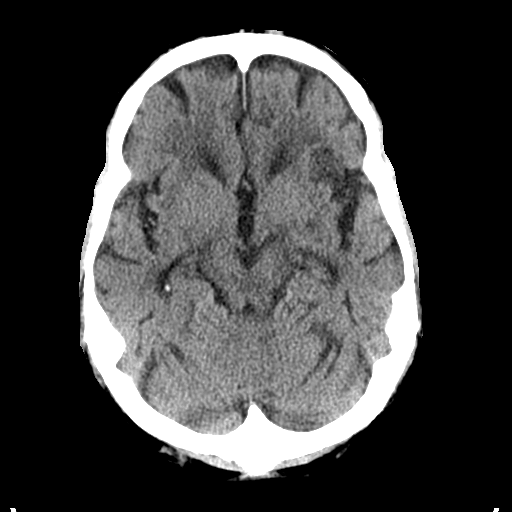
[im 18/35  brain]
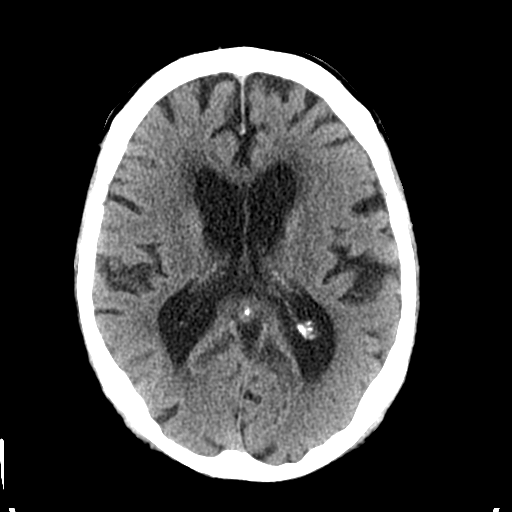
[im 22/35  brain]
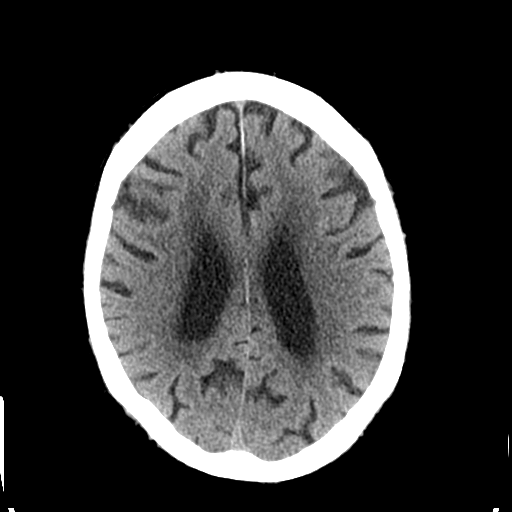
[im 22/35  bone]
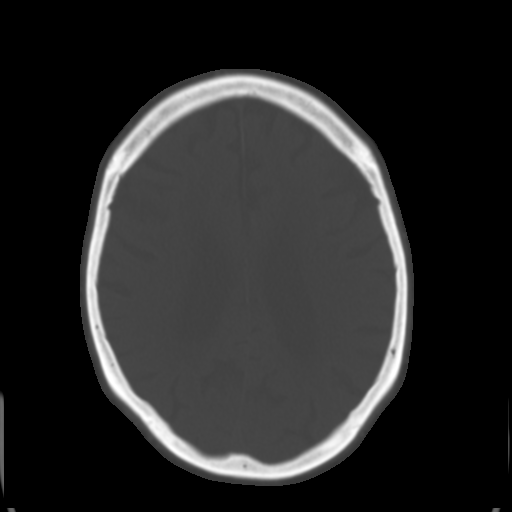
[im 26/35  brain]
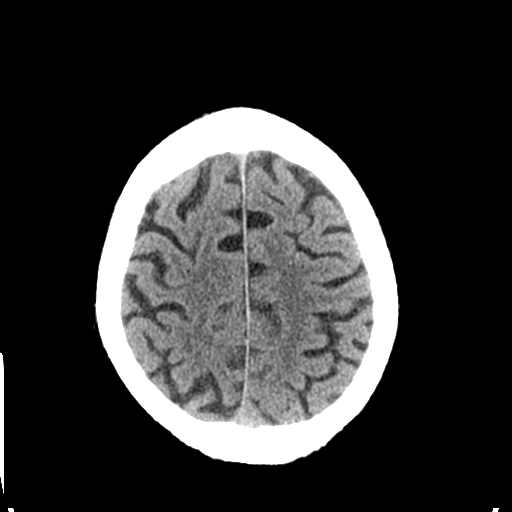
[im 30/35  brain]
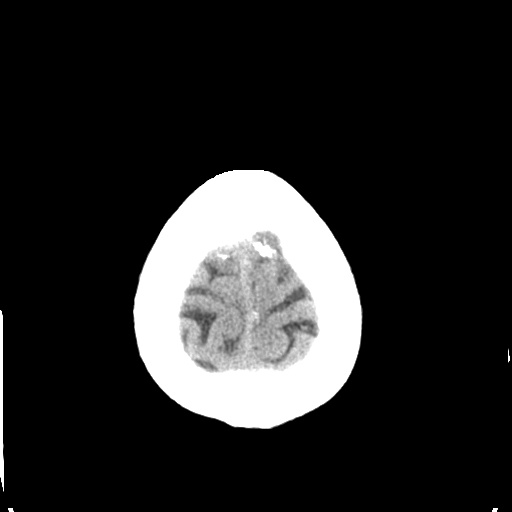

[Series 3: head bone · axial · 0.45mm/px · z∈[-114,-54]mm · 4 of 87 slices shown]
[im 9/87  bone]
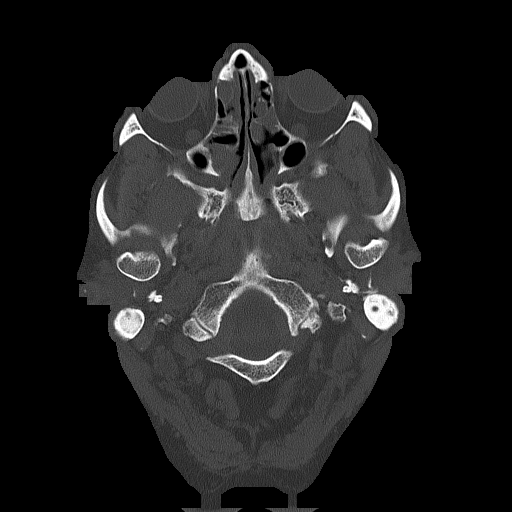
[im 18/87  bone]
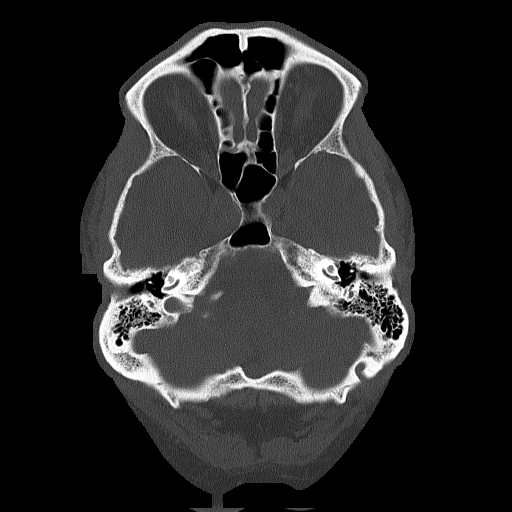
[im 26/87  bone]
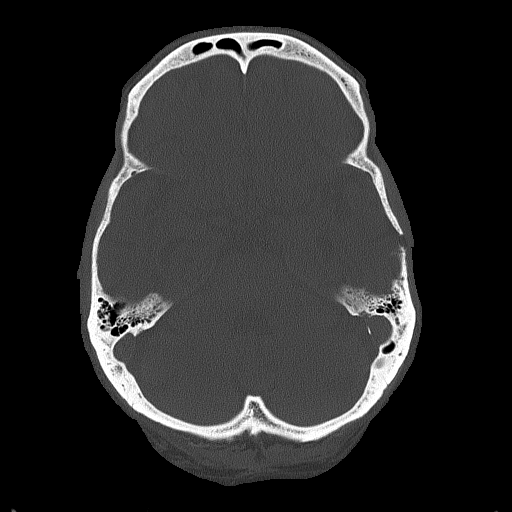
[im 39/87  bone]
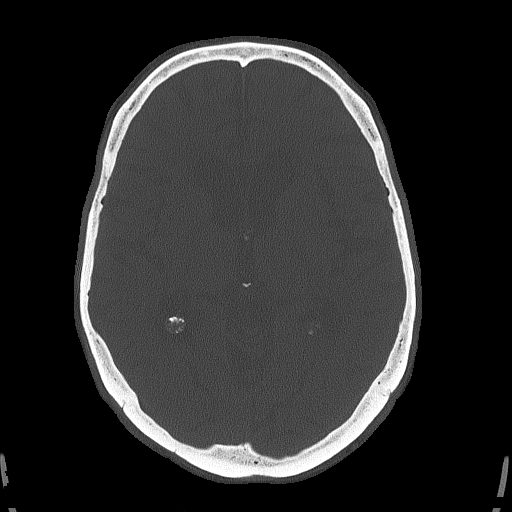

[Series 4: coronal soft tissue · coronal · 0.37mm/px · 3 of 76 slices shown]
[im 26/76  brain]
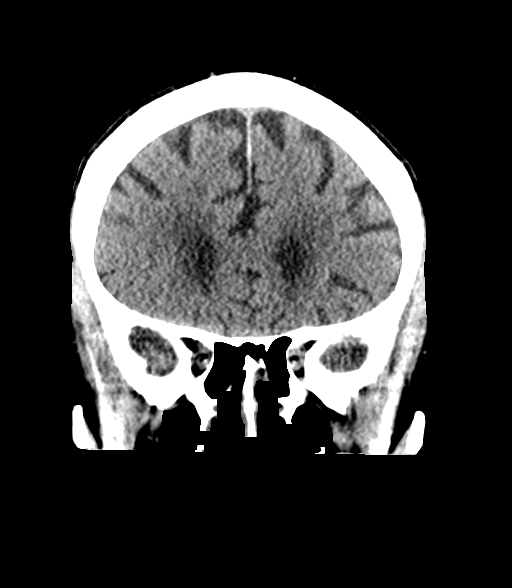
[im 34/76  brain]
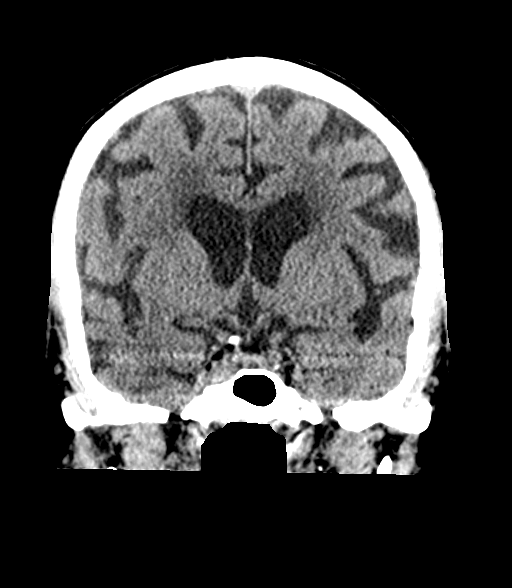
[im 42/76  brain]
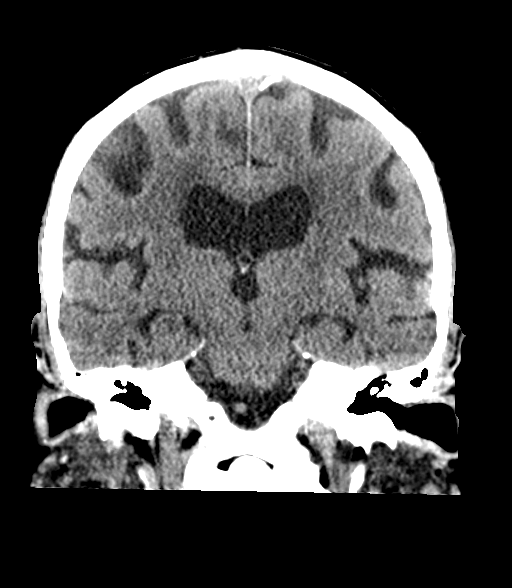

[Series 5: sagittal soft tissue · sagittal · 0.39mm/px · 3 of 61 slices shown]
[im 21/61  brain]
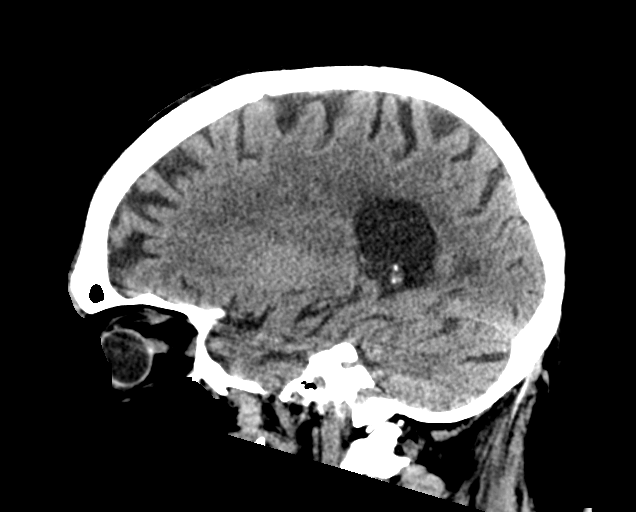
[im 31/61  brain]
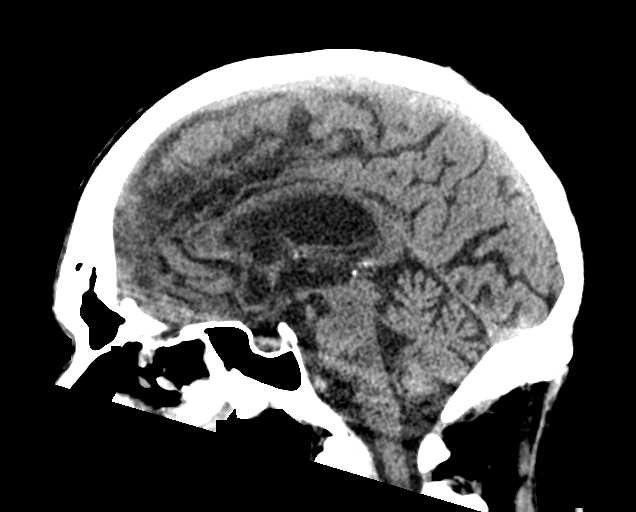
[im 41/61  brain]
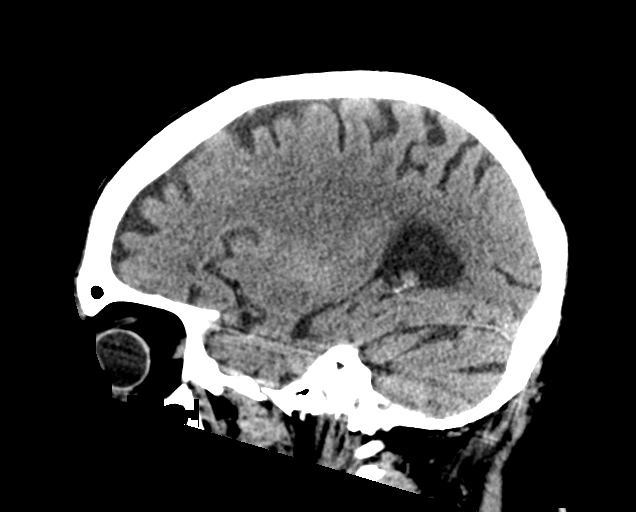

[17 of 47 positions shown; findings below may reference images not displayed]

FINDINGS: Brain: No evidence of acute infarction, hemorrhage, hydrocephalus,
extra-axial collection or mass lesion/mass effect. Chronic atrophic
and ischemic changes are again noted.

Vascular: No hyperdense vessel or unexpected calcification.

Skull: Normal. Negative for fracture or focal lesion.

Sinuses/Orbits: No acute finding.

Other: None.
IMPRESSION: Chronic atrophic and ischemic changes without acute abnormality.

## 2022-04-28 ENCOUNTER — Inpatient Hospital Stay
Admission: EM | Admit: 2022-04-28 | Discharge: 2022-04-30 | DRG: 177 | Disposition: A | Discharge status: Home Health | Payer: Medicare Other | Attending: Internal Medicine | Admitting: Internal Medicine

## 2022-04-28 ENCOUNTER — Other Ambulatory Visit: Payer: Self-pay

## 2022-04-28 ENCOUNTER — Encounter: Payer: Self-pay | Admitting: Emergency Medicine

## 2022-04-28 ENCOUNTER — Emergency Department: Payer: Medicare Other

## 2022-04-28 DIAGNOSIS — I2510 Atherosclerotic heart disease of native coronary artery without angina pectoris: Secondary | ICD-10-CM | POA: Diagnosis present

## 2022-04-28 DIAGNOSIS — J9601 Acute respiratory failure with hypoxia: Secondary | ICD-10-CM | POA: Diagnosis present

## 2022-04-28 DIAGNOSIS — Z7982 Long term (current) use of aspirin: Secondary | ICD-10-CM

## 2022-04-28 DIAGNOSIS — Z950 Presence of cardiac pacemaker: Secondary | ICD-10-CM

## 2022-04-28 DIAGNOSIS — R531 Weakness: Secondary | ICD-10-CM

## 2022-04-28 DIAGNOSIS — Z8673 Personal history of transient ischemic attack (TIA), and cerebral infarction without residual deficits: Secondary | ICD-10-CM

## 2022-04-28 DIAGNOSIS — Z8616 Personal history of COVID-19: Secondary | ICD-10-CM

## 2022-04-28 DIAGNOSIS — E663 Overweight: Secondary | ICD-10-CM | POA: Diagnosis present

## 2022-04-28 DIAGNOSIS — R0609 Other forms of dyspnea: Secondary | ICD-10-CM | POA: Diagnosis present

## 2022-04-28 DIAGNOSIS — Z825 Family history of asthma and other chronic lower respiratory diseases: Secondary | ICD-10-CM

## 2022-04-28 DIAGNOSIS — Z7902 Long term (current) use of antithrombotics/antiplatelets: Secondary | ICD-10-CM

## 2022-04-28 DIAGNOSIS — J45909 Unspecified asthma, uncomplicated: Secondary | ICD-10-CM | POA: Diagnosis present

## 2022-04-28 DIAGNOSIS — N1831 Chronic kidney disease, stage 3a: Secondary | ICD-10-CM | POA: Diagnosis present

## 2022-04-28 DIAGNOSIS — E785 Hyperlipidemia, unspecified: Secondary | ICD-10-CM | POA: Diagnosis present

## 2022-04-28 DIAGNOSIS — Z6826 Body mass index (BMI) 26.0-26.9, adult: Secondary | ICD-10-CM

## 2022-04-28 DIAGNOSIS — K219 Gastro-esophageal reflux disease without esophagitis: Secondary | ICD-10-CM | POA: Diagnosis present

## 2022-04-28 DIAGNOSIS — I959 Hypotension, unspecified: Secondary | ICD-10-CM | POA: Diagnosis present

## 2022-04-28 DIAGNOSIS — Z823 Family history of stroke: Secondary | ICD-10-CM

## 2022-04-28 DIAGNOSIS — U071 COVID-19: Secondary | ICD-10-CM | POA: Diagnosis not present

## 2022-04-28 DIAGNOSIS — Z79899 Other long term (current) drug therapy: Secondary | ICD-10-CM

## 2022-04-28 LAB — COMPREHENSIVE METABOLIC PANEL
ALT: 15 U/L (ref 0–44)
AST: 18 U/L (ref 15–41)
Albumin: 3.7 g/dL (ref 3.5–5.0)
Alkaline Phosphatase: 29 U/L — ABNORMAL LOW (ref 38–126)
Anion gap: 8 (ref 5–15)
BUN: 27 mg/dL — ABNORMAL HIGH (ref 8–23)
CO2: 25 mmol/L (ref 22–32)
Calcium: 9.4 mg/dL (ref 8.9–10.3)
Chloride: 106 mmol/L (ref 98–111)
Creatinine, Ser: 1.29 mg/dL — ABNORMAL HIGH (ref 0.61–1.24)
GFR, Estimated: 51 mL/min — ABNORMAL LOW (ref >60)
Glucose, Bld: 117 mg/dL — ABNORMAL HIGH (ref 70–99)
Potassium: 4.3 mmol/L (ref 3.5–5.1)
Sodium: 139 mmol/L (ref 135–145)
Total Bilirubin: 0.8 mg/dL (ref 0.3–1.2)
Total Protein: 7.1 g/dL (ref 6.5–8.1)

## 2022-04-28 LAB — CBC WITH DIFFERENTIAL/PLATELET
Abs Immature Granulocytes: 0.04 10*3/uL (ref 0.00–0.07)
Basophils Absolute: 0 10*3/uL (ref 0.0–0.1)
Basophils Relative: 0 %
Eosinophils Absolute: 0.2 10*3/uL (ref 0.0–0.5)
Eosinophils Relative: 2 %
HCT: 35.8 % — ABNORMAL LOW (ref 39.0–52.0)
Hemoglobin: 11.7 g/dL — ABNORMAL LOW (ref 13.0–17.0)
Immature Granulocytes: 0 %
Lymphocytes Relative: 18 %
Lymphs Abs: 1.8 10*3/uL (ref 0.7–4.0)
MCH: 32.4 pg (ref 26.0–34.0)
MCHC: 32.7 g/dL (ref 30.0–36.0)
MCV: 99.2 fL (ref 80.0–100.0)
Monocytes Absolute: 0.9 10*3/uL (ref 0.1–1.0)
Monocytes Relative: 9 %
Neutro Abs: 7.2 10*3/uL (ref 1.7–7.7)
Neutrophils Relative %: 71 %
Platelets: 211 10*3/uL (ref 150–400)
RBC: 3.61 MIL/uL — ABNORMAL LOW (ref 4.22–5.81)
RDW: 12.9 % (ref 11.5–15.5)
WBC: 10.2 10*3/uL (ref 4.0–10.5)
nRBC: 0 % (ref 0.0–0.2)

## 2022-04-28 LAB — TROPONIN I (HIGH SENSITIVITY)
Troponin I (High Sensitivity): 8 ng/L (ref <18)
Troponin I (High Sensitivity): 6 ng/L (ref <18)

## 2022-04-28 LAB — PROCALCITONIN: Procalcitonin: <0.1 ng/mL

## 2022-04-28 LAB — RESP PANEL BY RT-PCR (FLU A&B, COVID) ARPGX2
Influenza A by PCR: NEGATIVE
Influenza B by PCR: NEGATIVE
SARS Coronavirus 2 by RT PCR: POSITIVE — AB

## 2022-04-28 MED ORDER — PREDNISONE 20 MG PO TABS: 50.0000 mg | ORAL_TABLET | Freq: Every day | ORAL | Status: DC

## 2022-04-28 MED ORDER — ASPIRIN 81 MG PO CHEW
81.0000 mg | CHEWABLE_TABLET | Freq: Every day | ORAL | Status: DC
  Administered 2022-04-28 – 2022-04-29 (×2): 81 mg via ORAL
  Filled 2022-04-28 (×2): qty 1

## 2022-04-28 MED ORDER — SODIUM CHLORIDE 0.9 % IV BOLUS
500.0000 mL | Freq: Once | INTRAVENOUS | Status: AC
  Administered 2022-04-28: 500 mL via INTRAVENOUS

## 2022-04-28 MED ORDER — ENOXAPARIN SODIUM 40 MG/0.4ML IJ SOSY
40.0000 mg | PREFILLED_SYRINGE | INTRAMUSCULAR | Status: DC
  Administered 2022-04-28 – 2022-04-29 (×2): 40 mg via SUBCUTANEOUS
  Filled 2022-04-28 (×2): qty 0.4

## 2022-04-28 MED ORDER — ACETAMINOPHEN 325 MG PO TABS: 650.0000 mg | ORAL_TABLET | Freq: Four times a day (QID) | ORAL | Status: DC | PRN

## 2022-04-28 MED ORDER — CYANOCOBALAMIN 500 MCG PO TABS
500.0000 ug | ORAL_TABLET | Freq: Every day | ORAL | Status: DC
  Administered 2022-04-29 – 2022-04-30 (×2): 500 ug via ORAL
  Filled 2022-04-28 (×3): qty 1

## 2022-04-28 MED ORDER — ONDANSETRON HCL 4 MG/2ML IJ SOLN: 4.0000 mg | Freq: Four times a day (QID) | INTRAMUSCULAR | Status: DC | PRN

## 2022-04-28 MED ORDER — METHYLPREDNISOLONE SODIUM SUCC 125 MG IJ SOLR
1.0000 mg/kg | Freq: Two times a day (BID) | INTRAMUSCULAR | Status: DC
  Administered 2022-04-29 – 2022-04-30 (×3): 83.75 mg via INTRAVENOUS
  Filled 2022-04-28 (×3): qty 2

## 2022-04-28 MED ORDER — IPRATROPIUM-ALBUTEROL 0.5-2.5 (3) MG/3ML IN SOLN
3.0000 mL | Freq: Once | RESPIRATORY_TRACT | Status: AC
  Administered 2022-04-28: 3 mL via RESPIRATORY_TRACT
  Filled 2022-04-28: qty 3

## 2022-04-28 MED ORDER — MELATONIN 5 MG PO TABS
5.0000 mg | ORAL_TABLET | Freq: Every evening | ORAL | Status: DC | PRN
  Administered 2022-04-28: 5 mg via ORAL
  Filled 2022-04-28: qty 1

## 2022-04-28 MED ORDER — ONDANSETRON HCL 4 MG PO TABS: 4.0000 mg | ORAL_TABLET | Freq: Four times a day (QID) | ORAL | Status: DC | PRN

## 2022-04-28 MED ORDER — ADULT MULTIVITAMIN W/MINERALS CH
1.0000 | ORAL_TABLET | Freq: Every day | ORAL | Status: DC
  Administered 2022-04-29 – 2022-04-30 (×2): 1 via ORAL
  Filled 2022-04-28 (×2): qty 1

## 2022-04-28 MED ORDER — DEXAMETHASONE SODIUM PHOSPHATE 10 MG/ML IJ SOLN
10.0000 mg | Freq: Once | INTRAMUSCULAR | Status: AC
  Administered 2022-04-28: 10 mg via INTRAVENOUS
  Filled 2022-04-28: qty 1

## 2022-04-28 MED ORDER — SENNOSIDES-DOCUSATE SODIUM 8.6-50 MG PO TABS: 1.0000 | ORAL_TABLET | Freq: Every evening | ORAL | Status: DC | PRN

## 2022-04-28 MED ORDER — NIRMATRELVIR/RITONAVIR (PAXLOVID) TABLET (RENAL DOSING)
2.0000 | ORAL_TABLET | Freq: Two times a day (BID) | ORAL | Status: DC
  Administered 2022-04-29 – 2022-04-30 (×3): 2 via ORAL
  Filled 2022-04-28: qty 20

## 2022-04-28 MED ORDER — HYDROCOD POLI-CHLORPHE POLI ER 10-8 MG/5ML PO SUER
5.0000 mL | Freq: Every evening | ORAL | Status: DC | PRN
  Administered 2022-04-29: 5 mL via ORAL
  Filled 2022-04-28: qty 5

## 2022-04-28 MED ORDER — GUAIFENESIN-DM 100-10 MG/5ML PO SYRP: 10.0000 mL | ORAL_SOLUTION | ORAL | Status: DC | PRN

## 2022-04-28 MED ORDER — IPRATROPIUM-ALBUTEROL 0.5-2.5 (3) MG/3ML IN SOLN
3.0000 mL | Freq: Three times a day (TID) | RESPIRATORY_TRACT | Status: DC
  Administered 2022-04-28: 3 mL via RESPIRATORY_TRACT
  Filled 2022-04-28 (×2): qty 3

## 2022-04-28 NOTE — Hospital Course (Signed)
Mr. Merwyn Hodapp is a 88 year old male with history of hyperlipidemia, CAD, on Plavix and aspirin, insomnia, who presents emergency department for chief concerns of shortness of breath and cough.  Initial vitals in the emergency department showed temperature of 98.5, respiration rate of 20, heart rate of 22, blood pressure 116/61, SPO2 98% on room air.  Patient desatted to the 80s with ambulation  Serum sodium 139, potassium 4.3, chloride 106, bicarb 25, BUN 27, serum creatinine 1.29, GFR 51, WBC 10.2, hemoglobin 11.7, platelets of 211.  High sensitive troponin was 8.    COVID PCR was positive.  ED treatment: Decadron 10 mg IV one-time dose, Paxlovid initiated for 5-day course, DuoNebs x1, and sodium chloride 500 mL bolus.

## 2022-04-28 NOTE — Assessment & Plan Note (Signed)
At baseline 

## 2022-04-28 NOTE — Assessment & Plan Note (Addendum)
Atorvastatin held while patient on Paxlovid

## 2022-04-28 NOTE — Assessment & Plan Note (Addendum)
-   Patient desatted to 42s with ambulation on room air, at baseline patient does not require O2 supplementation. - Continue oxygen supplementation as needed to maintain SPO2 greater than 92% - Treat with Paxlovid and IV steroids for COVID infection

## 2022-04-28 NOTE — ED Provider Notes (Signed)
Patient COVID test positive  Reviewed with pharmacist Erlene Quan, and will start patient on Paxlovid.  Discussed this with the patient as well and he is understanding agreeable with moving forward with Paxlovid including discussion of some of its common side effects  Suspect mild AKI, fluid bolus ordered.  Given the patient had hypoxia with ambulation desatting to approximately 80%, will admit to hospitalist for further care and treatment of COVID-19 virus infection  Vitals:   04/28/22 1333 04/28/22 1335  BP:  116/61  Pulse:  77  Resp: 20   Temp: 98.5 F (36.9 C)   SpO2:  98%    Currently resting comfortably without distress on room air but experiences dyspnea and hypoxia with ambulation  Consulted with hospitalist Dr. Tobie Poet who is excepting of admission   Delman Kitten, MD 04/28/22 1713

## 2022-04-28 NOTE — H&P (Addendum)
History and Physical   Kalab Camps AOZ:[id number] DOB: 06-30-1925 DOA: 04/28/2022  PCP: Tracie Harrier, MD  Patient coming from: Home  I have personally briefly reviewed patient's old medical records in [place].  Chief Concern: Shortness of breath  HPI: [name] is a 88 year old male with history of hyperlipidemia, CAD, on Plavix and aspirin, insomnia, who presents emergency department for chief concerns of shortness of breath and cough.  Initial vitals in the emergency department showed temperature of 98.5, respiration rate of 20, heart rate of 22, blood pressure 116/61, SPO2 98% on room air.  Patient desatted to the 80s with ambulation  Serum sodium 139, potassium 4.3, chloride 106, bicarb 25, BUN 27, serum creatinine 1.29, GFR 51, WBC 10.2, hemoglobin 11.7, platelets of 211.  High sensitive troponin was 8.    COVID PCR was positive.  ED treatment: Decadron 10 mg IV one-time dose, Paxlovid initiated for 5-day course, DuoNebs x1, and sodium chloride 500 mL bolus.  At bedside, he is able to tell me his name, age, current calendar, and current location.   He reports baseline shortness of breath and cough. He states he has never been this bad until 3-4 days ago.  He denies known sick contacts and he reports that he was afraid he had covid. He states these symptoms as last year when he had covid.   He endorsed nausea and decreased PO intake and denies vomiting.  He denies subjective fever, chills, diarrhea, chest pain, abdominal pain, dysuria, hematuria, diarrhea, swelling of his lower extremities.  Social history: He lives by himself. He performs his own ADLs.  He still drives a vehicle and get his own groceries which mainly consist of TV dinners.  He denies tobacco, etoh, and recreational drug use. He is retired and formerly was a [employer].   Vaccination history: He reports he is vaccinated for at least 3 doses.  ROS: Constitutional: no weight  change, no fever ENT/Mouth: no sore throat, no rhinorrhea Eyes: no eye pain, no vision changes Cardiovascular: no chest pain, no dyspnea,  no edema, no palpitations Respiratory: no cough, no sputum, no wheezing Gastrointestinal: no nausea, no vomiting, no diarrhea, no constipation Genitourinary: no urinary incontinence, no dysuria, no hematuria Musculoskeletal: no arthralgias, no myalgias Skin: no skin lesions, no pruritus, Neuro: + weakness, no loss of consciousness, no syncope Psych: no anxiety, no depression, + decrease appetite Heme/Lymph: no bruising, no bleeding  ED Course: Discussed with emergency medicine provider, patient requiring hospitalization for chief concerns of acute hypoxic respiratory failure with ambulation.  Assessment/Plan  Principal Problem:   Acute hypoxemic respiratory failure (HCC) Active Problems:   Asthma   DOE (dyspnea on exertion)   GERD (gastroesophageal reflux disease)   History of TIA (transient ischemic attack)   Generalized weakness   COVID-19 virus infection   Stage 3a chronic kidney disease (CKD) (HCC)   Assessment and Plan:  * Acute hypoxemic respiratory failure ([place]) - Patient desatted to 80s with ambulation on room air, at baseline patient does not require O2 supplementation - Continue oxygen supplementation as needed to maintain SPO2 greater than 92% - Treat with Paxlovid and IV steroids for COVID infection  Stage 3a chronic kidney disease (CKD) (HCC) - At baseline  COVID-19 virus infection Acute hypoxemic respiratory failure secondary to COVID-19 infection - P.o. Paxlovid, regular dosing ordered - Superimposed bacterial infection coverage with ceftriaxone 1 g daily IV and azithromycin 500 mg IV daily started - Incentive spirometry and flutter valve for  10 reps every 2 hours while awake  - Supplemental oxygen to maintain SPO2 goal of greater than 88% - Airborne and contact precautions  History of TIA (transient ischemic attack) -  I will hold home atorvastatin and Plavix due to patient being on Paxlovid - Aspirin 81 mg resolved  Chart reviewed.   DVT prophylaxis: Enoxaparin 40 mg subcutaneous Code Status: Full code Diet: Heart healthy Family Communication: A phone call was offered, patient states that his daughter already knows he is in the hospital Disposition Plan:Pending clinical course Consults called: None at this time Admission status: Telemetry medical, observation  Past Medical History:  Diagnosis Date   Asthma    Cardiac arrhythmia    CHF (congestive heart failure) ([place])    Hyperlipidemia    Shortness of breath    Stroke ([place])    TIA (transient ischemic attack)    Past Surgical History:  Procedure Laterality Date   APPENDECTOMY  2011   [place] Right 10 years ago   [place] Left 07/11/2019   Procedure: PACEMAKER CHANGEOUT;  Surgeon: Isaias Cowman, MD;  Location: [facility] ORS;  Service: Cardiovascular;  Laterality: Left;   Social History:  reports that he has never smoked. He has never used smokeless tobacco. He reports that he does not drink alcohol and does not use drugs.  No Known Allergies Family History  Problem Relation Age of Onset   CVA Mother    COPD Father    Family history: Family history reviewed and not pertinent  Prior to Admission medications   Medication Sig Start Date End Date Taking? Authorizing Provider  aspirin 81 MG chewable tablet Chew 81 mg by mouth at bedtime.    [provider]  atorvastatin (LIPITOR) 20 MG tablet Take 20 mg by mouth at bedtime.    [provider]  clopidogrel (PLAVIX) 75 MG tablet Take 75 mg by mouth daily.    [provider]  guaiFENesin-dextromethorphan (ROBITUSSIN DM) 100-10 MG/5ML syrup Take 10 mLs by mouth every 4 (four) hours as needed for cough. 02/12/21   Terrilee Croak, MD  Melatonin 1 MG TABS Take 5 mg by mouth at bedtime.    [provider]   Multiple Vitamin (MULTI-VITAMINS) TABS Take 1 tablet by mouth daily.    [provider]  vitamin B-12 (CYANOCOBALAMIN) 500 MCG tablet Take 500 mcg by mouth daily.    [provider]   Physical Exam: Vitals:   04/28/22 1333 04/28/22 1335  BP:  116/61  Pulse:  77  Resp: 20   Temp: 98.5 F (36.9 C)   TempSrc: Oral   SpO2:  98%  Weight:  83.9 kg  Height:  5\' 10"  (1.778 m)   Constitutional: appears younger than chronological age, frail, NAD, calm, comfortable Eyes: PERRL, lids and conjunctivae normal ENMT: Mucous membranes are moist. Posterior pharynx clear of any exudate or lesions. Age-appropriate dentition. Hearing appropriate Neck: normal, supple, no masses, no thyromegaly Respiratory: clear to auscultation bilaterally, no wheezing, no crackles. Normal respiratory effort. No accessory muscle use.  Cardiovascular: Regular rate and rhythm, no murmurs / rubs / gallops. No extremity edema. 2+ pedal pulses. No carotid bruits.  Abdomen: Obese abdomen, no tenderness, no masses palpated, no hepatosplenomegaly. Bowel sounds positive.  Musculoskeletal: no clubbing / cyanosis. No joint deformity upper and lower extremities. Good ROM, no contractures, no atrophy. Normal muscle tone.  Skin: no rashes, lesions, ulcers. No induration Neurologic:  Sensation intact. Strength 5/5 in all 4.  Psychiatric: Normal judgment and insight. Alert and oriented x 3. Normal mood.   EKG: independently reviewed, showing sinus rhythm with rate of 74, QTc 410  Chest x-ray on Admission: I personally reviewed and I agree with radiologist reading as below.  DG Chest 2 View  Result Date: 04/28/2022 CLINICAL DATA:  Cough and congestion.  Short of breath EXAM: CHEST - 2 VIEW COMPARISON:  Chest 02/10/2021 FINDINGS: Heart size and vascularity normal. Dual lead pacemaker unchanged in position. Mild elevation left hemidiaphragm unchanged. Lungs clear without infiltrate or effusion. No change from the prior  study. IMPRESSION: No active cardiopulmonary disease. Electronically Signed   By: Franchot Gallo M.D.   On: 04/28/2022 14:14    Labs on Admission: I have personally reviewed following labs  CBC: Recent Labs  Lab 04/28/22 1439  WBC 10.2  NEUTROABS 7.2  HGB 11.7*  HCT 35.8*  MCV 99.2  PLT 123456   Basic Metabolic Panel: Recent Labs  Lab 04/28/22 1547  NA 139  K 4.3  CL 106  CO2 25  GLUCOSE 117*  BUN 27*  CREATININE 1.29*  CALCIUM 9.4   GFR: Estimated Creatinine Clearance: 36.2 mL/min (A) (by C-G formula based on SCr of 1.29 mg/dL (H)).  Liver Function Tests: Recent Labs  Lab 04/28/22 1547  AST 18  ALT 15  ALKPHOS 29*  BILITOT 0.8  PROT 7.1  ALBUMIN 3.7   Urine analysis:    Component Value Date/Time   COLORURINE Yellow 07/27/2012 1905   APPEARANCEUR Clear 07/27/2012 1905   LABSPEC 1.056 07/27/2012 1905   PHURINE 5.0 07/27/2012 1905   GLUCOSEU Negative 07/27/2012 1905   HGBUR Negative 07/27/2012 1905   BILIRUBINUR Negative 07/27/2012 1905   KETONESUR Negative 07/27/2012 1905   PROTEINUR Negative 07/27/2012 1905   NITRITE Negative 07/27/2012 1905   LEUKOCYTESUR Negative 07/27/2012 1905   Dr. Tobie Poet [facility]  If 7PM-7AM, please contact overnight-coverage provider If 7AM-7PM, please contact day coverage provider [email or web address]  04/28/2022, 8:45 PM

## 2022-04-28 NOTE — ED Triage Notes (Signed)
First Nurse Note:  C/O cough, sneezing, feeling bad since last night.  Denies fever.

## 2022-04-28 NOTE — ED Triage Notes (Signed)
Pt here with a cough and SOB that started 2 days ago. Pt believes he may have covid. Pt denies fever.

## 2022-04-28 NOTE — Assessment & Plan Note (Signed)
Started on Paxlovid.  Given normal procalcitonin, will discontinue antibiotics.  Checking BNP, D-dimer and CRP levels.  Some mild end expiratory wheezing so we will start steroids after CRP.  Stable on room air at rest although does become hypoxic with ambulation.

## 2022-04-28 NOTE — ED Provider Notes (Signed)
[facility] Provider Note    Event Date/Time   First MD Initiated Contact with Patient 04/28/22 1422     (approximate)   History   Cough and Shortness of Breath   HPI  Elijah Deguia. is a 88 y.o. male with a history of bronchitis who presents to the ER for evaluation of wheezing and shortness of breath that started today.  Patient states it feels similar to when he was previously diagnosed with COVID last year.  Does not wear home oxygen.  Denies any chest pain or pressure.  3 or vomiting.  No diarrhea.     Physical Exam   Triage Vital Signs: ED Triage Vitals  Enc Vitals Group     BP 04/28/22 1335 116/61     Pulse Rate 04/28/22 1335 77     Resp 04/28/22 1333 20     Temp 04/28/22 1333 98.5 F (36.9 C)     Temp Source 04/28/22 1333 Oral     SpO2 04/28/22 1335 98 %     Weight 04/28/22 1335 184 lb 15.5 oz (83.9 kg)     Height 04/28/22 1335 5\' 10"  (1.778 m)     Head Circumference --      Peak Flow --      Pain Score 04/28/22 1334 0     Pain Loc --      Pain Edu? --      Excl. in GC? --     Most recent vital signs: Vitals:   04/28/22 1333 04/28/22 1335  BP:  116/61  Pulse:  77  Resp: 20   Temp: 98.5 F (36.9 C)   SpO2:  98%     Constitutional: Alert  Eyes: Conjunctivae are normal.  Head: Atraumatic. Nose: No congestion/rhinnorhea. Mouth/Throat: Mucous membranes are moist.   Neck: Painless ROM.  Cardiovascular:   Good peripheral circulation. Respiratory: scattered wheeze throughout, no rhonchi Gastrointestinal: Soft and nontender.  Musculoskeletal:  no deformity Neurologic:  MAE spontaneously. No gross focal neurologic deficits are appreciated.  Skin:  Skin is warm, dry and intact. No rash noted. Psychiatric: Mood and affect are normal. Speech and behavior are normal.    ED Results / Procedures / Treatments   Labs (all labs ordered are listed, but only abnormal results are displayed) Labs Reviewed  RESP PANEL BY RT-PCR  (FLU A&B, COVID) ARPGX2 - Abnormal; Notable for the following components:      Result Value   SARS Coronavirus 2 by RT PCR POSITIVE (*)    All other components within normal limits  CBC WITH DIFFERENTIAL/PLATELET - Abnormal; Notable for the following components:   RBC 3.61 (*)    Hemoglobin 11.7 (*)    HCT 35.8 (*)    All other components within normal limits  COMPREHENSIVE METABOLIC PANEL  TROPONIN I (HIGH SENSITIVITY)     EKG  ED ECG REPORT I, 04/30/22, the attending physician, personally viewed and interpreted this ECG.   Date: 04/28/2022  EKG Time: 15:16  Rate: 75  Rhythm: sinus  Axis: normal  Intervals: normal intervals  ST&T Change: no stemi, no depressions    RADIOLOGY Please see ED Course for my review and interpretation.  I personally reviewed all radiographic images ordered to evaluate for the above acute complaints and reviewed radiology reports and findings.  These findings were personally discussed with the patient.  Please see medical record for radiology report.    PROCEDURES:  Critical Care performed: Yes, see critical care procedure  note(s)  .Critical Care  Performed by: Merlyn Lot, MD Authorized by: Merlyn Lot, MD   Critical care provider statement:    Critical care time (minutes):  35   Critical care was necessary to treat or prevent imminent or life-threatening deterioration of the following conditions:  Respiratory failure   Critical care was time spent personally by me on the following activities:  Ordering and performing treatments and interventions, ordering and review of laboratory studies, ordering and review of radiographic studies, pulse oximetry, re-evaluation of patient's condition, review of old charts, obtaining history from patient or surrogate, examination of patient, evaluation of patient's response to treatment, discussions with primary provider, discussions with consultants and development of treatment plan with  patient or surrogate    MEDICATIONS ORDERED IN ED: Medications  dexamethasone (DECADRON) injection 10 mg (has no administration in time range)  ipratropium-albuterol (DUONEB) 0.5-2.5 (3) MG/3ML nebulizer solution 3 mL (3 mLs Nebulization Given 04/28/22 1447)     IMPRESSION / MDM / [place] / ED COURSE  I reviewed the triage vital signs and the nursing notes.                              Differential diagnosis includes, but is not limited to, Asthma, copd, CHF, pna, ptx, malignancy, Pe, anemia  Patient presented to the ER for evaluation of symptoms as described above.  This presenting complaint could reflect a potentially life-threatening illness therefore the patient will be placed on continuous pulse oximetry and telemetry for monitoring.  Laboratory evaluation will be sent to evaluate for the above complaints.     Clinical Course as of 04/28/22 1533  Tue Apr 28, 2022  1429 Patient has tested positive for COVID. [PR]  4132 Chest x-ray on my review and interpretation does not show any evidence of infiltrate or consolidation.  Patient was ambulated was very unsteady feeling very weak with O2 sat desaturating down to 80%.  He does return back to normal at rest.  He does not wear home oxygen.  Patient will require hospitalization. PT will be signed out to oncoming physician pending  follow up mp and consult to hospitalist. [PR]    Clinical Course User Index [PR] Merlyn Lot, MD     FINAL CLINICAL IMPRESSION(S) / ED DIAGNOSES   Final diagnoses:  Acute respiratory failure with hypoxia ([place])  COVID-19     Rx / DC Orders   ED Discharge Orders     None        Note:  This document was prepared using Dragon voice recognition software and may include unintentional dictation errors.    Merlyn Lot, MD 04/28/22 [phone]

## 2022-04-29 DIAGNOSIS — E663 Overweight: Secondary | ICD-10-CM | POA: Diagnosis present

## 2022-04-29 DIAGNOSIS — N1831 Chronic kidney disease, stage 3a: Secondary | ICD-10-CM

## 2022-04-29 DIAGNOSIS — U071 COVID-19: Secondary | ICD-10-CM

## 2022-04-29 DIAGNOSIS — J9601 Acute respiratory failure with hypoxia: Secondary | ICD-10-CM | POA: Diagnosis not present

## 2022-04-29 LAB — C-REACTIVE PROTEIN: CRP: 1.8 mg/dL — ABNORMAL HIGH (ref <1.0)

## 2022-04-29 LAB — BRAIN NATRIURETIC PEPTIDE: B Natriuretic Peptide: 244.5 pg/mL — ABNORMAL HIGH (ref 0.0–100.0)

## 2022-04-29 LAB — CBG MONITORING, ED: Glucose-Capillary: 92 mg/dL (ref 70–99)

## 2022-04-29 LAB — D-DIMER, QUANTITATIVE: D-Dimer, Quant: 0.74 ug/mL-FEU — ABNORMAL HIGH (ref 0.00–0.50)

## 2022-04-29 MED ORDER — SODIUM CHLORIDE 0.9 % IV BOLUS
500.0000 mL | Freq: Once | INTRAVENOUS | Status: AC
  Administered 2022-04-29: 500 mL via INTRAVENOUS

## 2022-04-29 MED ORDER — IPRATROPIUM-ALBUTEROL 20-100 MCG/ACT IN AERS
1.0000 | INHALATION_SPRAY | Freq: Three times a day (TID) | RESPIRATORY_TRACT | Status: DC
  Administered 2022-04-30 (×2): 1 via RESPIRATORY_TRACT
  Filled 2022-04-29: qty 4

## 2022-04-29 NOTE — Progress Notes (Signed)
[facility] Progress Note  Patient: Elijah Collins    EQA:[id number]  DOA: 04/28/2022    Date of Service: the patient was seen and examined on 04/29/2022  Brief hospital course: 88 year old male with past medical history of CAD and here with acute respiratory failure secondary to COVID.  Following admission, patient became hypotensive requiring fluid bolus.    Assessment and Plan: Assessment and Plan: * Acute hypoxemic respiratory failure ([place]) - Patient desatted to 75s with ambulation on room air, at baseline patient does not require O2 supplementation - Continue oxygen supplementation as needed to maintain SPO2 greater than 92% - Treat with Paxlovid and IV steroids for COVID infection  COVID-19 virus infection Started on Paxlovid.  Given normal procalcitonin, will discontinue antibiotics.  Checking BNP, D-dimer and CRP levels.  Some mild end expiratory wheezing so we will start steroids after CRP.  Stable on room air at rest although does become hypoxic with ambulation.   History of TIA (transient ischemic attack) - I will hold home atorvastatin and Plavix due to patient being on Paxlovid - Aspirin 81 mg resolved  Stage 3a chronic kidney disease (CKD) ([place]) - At baseline  Overweight (BMI 25.0-29.9) Meets criteria BMI greater than 25       Body mass index is 26.54 kg/m.        Consultants: None  Procedures:   Antimicrobials: Paxlovid 9/27-present   Code Status: Full code   Subjective: Patient states breathing slightly worse from previous day  Objective: Vital signs were reviewed and unremarkable. Vitals:   04/29/22 1325 04/29/22 1430  BP: (!) 109/52 (!) 112/56  Pulse: (!) 58 63  Resp: 11 16  Temp:    SpO2: 99% 93%   No intake or output data in the 24 hours ending 04/29/22 1556 Filed Weights   04/28/22 1335  Weight: 83.9 kg   Body mass index is 26.54 kg/m.  Exam:  General: Alert and oriented x3, no acute distress HEENT: Normocephalic,  atraumatic, mucous membranes are moist Cardiovascular: Regular rate and rhythm, S1-S2 Respiratory: Mild end expiratory wheeze Abdomen: Soft, nontender, nondistended, positive bowel sounds neuro Musculoskeletal: Clubbing or cyanosis or edema Skin: No skin breaks, tears or lesions Psychiatry: Appropriate, no evidence of psychoses Neurology: No focal deficits  Data Reviewed: Creatinine at 1.29, at baseline.  Procalcitonin level normal BNP, D-dimer and CRP are pending  Disposition:  Status is: Observation     Anticipated discharge date: 9/28  Remaining issues to be resolved so that patient can be discharged:  -Improving hypoxia -Evaluation by PT   Family Communication: Daughter at the bedside DVT Prophylaxis: enoxaparin (LOVENOX) injection 40 mg Start: 04/28/22 2200 Place TED hose Start: 04/28/22 1705    Author: Annita Brod ,MD 04/29/2022 3:56 PM  To reach On-call, see care teams to locate the attending and reach out via [email or web address]. Between 7PM-7AM, please contact night-coverage If you still have difficulty reaching the attending provider, please page the [facility] (Director on Call) for [facility] on amion for assistance.

## 2022-04-29 NOTE — Assessment & Plan Note (Signed)
Meets criteria BMI greater than 25 

## 2022-04-30 DIAGNOSIS — E663 Overweight: Secondary | ICD-10-CM | POA: Diagnosis present

## 2022-04-30 DIAGNOSIS — Z7982 Long term (current) use of aspirin: Secondary | ICD-10-CM | POA: Diagnosis not present

## 2022-04-30 DIAGNOSIS — Z950 Presence of cardiac pacemaker: Secondary | ICD-10-CM | POA: Diagnosis not present

## 2022-04-30 DIAGNOSIS — N1831 Chronic kidney disease, stage 3a: Secondary | ICD-10-CM | POA: Diagnosis present

## 2022-04-30 DIAGNOSIS — Z8616 Personal history of COVID-19: Secondary | ICD-10-CM | POA: Diagnosis not present

## 2022-04-30 DIAGNOSIS — J9601 Acute respiratory failure with hypoxia: Secondary | ICD-10-CM | POA: Diagnosis present

## 2022-04-30 DIAGNOSIS — Z825 Family history of asthma and other chronic lower respiratory diseases: Secondary | ICD-10-CM | POA: Diagnosis not present

## 2022-04-30 DIAGNOSIS — Z823 Family history of stroke: Secondary | ICD-10-CM | POA: Diagnosis not present

## 2022-04-30 DIAGNOSIS — U071 COVID-19: Secondary | ICD-10-CM | POA: Diagnosis present

## 2022-04-30 DIAGNOSIS — I2510 Atherosclerotic heart disease of native coronary artery without angina pectoris: Secondary | ICD-10-CM | POA: Diagnosis present

## 2022-04-30 DIAGNOSIS — E785 Hyperlipidemia, unspecified: Secondary | ICD-10-CM | POA: Diagnosis present

## 2022-04-30 DIAGNOSIS — K219 Gastro-esophageal reflux disease without esophagitis: Secondary | ICD-10-CM | POA: Diagnosis present

## 2022-04-30 DIAGNOSIS — Z8673 Personal history of transient ischemic attack (TIA), and cerebral infarction without residual deficits: Secondary | ICD-10-CM | POA: Diagnosis not present

## 2022-04-30 DIAGNOSIS — I959 Hypotension, unspecified: Secondary | ICD-10-CM | POA: Diagnosis present

## 2022-04-30 DIAGNOSIS — Z79899 Other long term (current) drug therapy: Secondary | ICD-10-CM | POA: Diagnosis not present

## 2022-04-30 DIAGNOSIS — Z7902 Long term (current) use of antithrombotics/antiplatelets: Secondary | ICD-10-CM | POA: Diagnosis not present

## 2022-04-30 DIAGNOSIS — J45909 Unspecified asthma, uncomplicated: Secondary | ICD-10-CM | POA: Diagnosis present

## 2022-04-30 DIAGNOSIS — Z6826 Body mass index (BMI) 26.0-26.9, adult: Secondary | ICD-10-CM | POA: Diagnosis not present

## 2022-04-30 LAB — BASIC METABOLIC PANEL
Anion gap: 7 (ref 5–15)
BUN: 41 mg/dL — ABNORMAL HIGH (ref 8–23)
CO2: 26 mmol/L (ref 22–32)
Calcium: 9.5 mg/dL (ref 8.9–10.3)
Chloride: 106 mmol/L (ref 98–111)
Creatinine, Ser: 1.56 mg/dL — ABNORMAL HIGH (ref 0.61–1.24)
GFR, Estimated: 41 mL/min — ABNORMAL LOW (ref >60)
Glucose, Bld: 144 mg/dL — ABNORMAL HIGH (ref 70–99)
Potassium: 4.9 mmol/L (ref 3.5–5.1)
Sodium: 139 mmol/L (ref 135–145)

## 2022-04-30 MED ORDER — NIRMATRELVIR/RITONAVIR (PAXLOVID) TABLET (RENAL DOSING): 2.0000 | ORAL_TABLET | Freq: Two times a day (BID) | ORAL | 0 refills | Status: AC

## 2022-04-30 MED ORDER — GUAIFENESIN-DM 100-10 MG/5ML PO SYRP: 10.0000 mL | ORAL_SOLUTION | ORAL | 0 refills | Status: AC | PRN

## 2022-04-30 MED ORDER — PREDNISONE 10 MG PO TABS: ORAL_TABLET | ORAL | 0 refills | Status: AC

## 2022-04-30 NOTE — TOC Transition Note (Signed)
Transition of Care [facility]) - CM/SW Discharge Note   Patient Details  Name: Elijah CORONA Sr. MRN: [id number] Date of Birth: Jun 08, 1925  [facility] CM/SW Contact:  Beverly Sessions, RN Phone Number: 04/30/2022, 4:08 PM   Clinical Narrative:     Patient to discharge today Elijah Collins with [facility] notified Requested MD enter home health orders         Patient Goals and CMS Choice        Discharge Placement                       Discharge Plan and Services                                     Social Determinants of Health (SDOH) Interventions     Readmission Risk Interventions     No data to display

## 2022-04-30 NOTE — Discharge Instructions (Addendum)
Hold your Lipitor/atorvastatin/cholesterol medicine while on Paxlovid.

## 2022-04-30 NOTE — Progress Notes (Signed)
SATURATION QUALIFICATIONS: (This note is used to comply with regulatory documentation for home oxygen)  Patient Saturations on Room Air at Rest = 98%  Patient Saturations on Room Air while Ambulating = 97%   

## 2022-04-30 NOTE — Discharge Summary (Signed)
Physician Discharge Summary   Patient: Elijah HIROTA Sr. MRN: [id number] DOB: [birth year]  Admit date:     04/28/2022  Discharge date: 04/30/22  Discharge Physician: Annita Brod   PCP: Tracie Harrier, MD   Recommendations at discharge:   New medication: Paxlovid p.o. twice daily for 3 more days Medication change: Atorvastatin on hold while patient on Paxlovid New medication: Prednisone taper New medication: Robitussin-DM every 6 hours as needed for cough  Discharge Diagnoses: Active Problems:   COVID-19 virus infection   History of TIA (transient ischemic attack)   Stage 3a chronic kidney disease (CKD) (HCC)   Overweight (BMI 25.0-29.9)  Principal Problem (Resolved):   Acute hypoxemic respiratory failure [facility])  Hospital Course: 88 year old male with past medical history of CAD and here with acute respiratory failure secondary to COVID.  Following admission, patient became hypotensive requiring fluid bolus.    Assessment and Plan: * Acute hypoxemic respiratory failure (HCC)-resolved as of 04/30/2022 - Patient desatted to 80s with ambulation on room air, at baseline patient does not require O2 supplementation. - Continue oxygen supplementation as needed to maintain SPO2 greater than 92% - Treat with Paxlovid and IV steroids for COVID infection  COVID-19 virus infection Started on Paxlovid.  Given normal procalcitonin, will discontinue antibiotics.  CRP minimal.  Some mild end expiratory wheezing.  Treated with IV steroids.  By 9/28, patient able to ambulate on room air keeping oxygen saturations above 95%.  Discharged home with a few more days of Paxlovid and steroid taper plus medicine for cough.  History of TIA (transient ischemic attack) Atorvastatin held while patient on Paxlovid  Stage 3a chronic kidney disease (CKD) ([place]) - At baseline  Overweight (BMI 25.0-29.9) Meets criteria BMI greater than 25         Consultants: None Procedures performed:  None Disposition: Home Diet recommendation:  Discharge Diet Orders (From admission, onward)     Start     Ordered   04/30/22 0000  Diet - low sodium heart healthy        04/30/22 1601           Cardiac diet DISCHARGE MEDICATION: Allergies as of 04/30/2022   No Known Allergies      Medication List     STOP taking these medications    atorvastatin 20 MG tablet Commonly known as: LIPITOR       TAKE these medications    aspirin 81 MG chewable tablet Chew 81 mg by mouth at bedtime.   clopidogrel 75 MG tablet Commonly known as: PLAVIX Take 75 mg by mouth daily.   guaiFENesin-dextromethorphan 100-10 MG/5ML syrup Commonly known as: ROBITUSSIN DM Take 10 mLs by mouth every 4 (four) hours as needed for cough.   melatonin 5 MG Tabs Take 5 mg by mouth at bedtime.   Multi-Vitamins Tabs Take 1 tablet by mouth daily.   nirmatrelvir/ritonavir EUA (renal dosing) 10 x 150 MG & 10 x 100MG  Tabs Commonly known as: PAXLOVID Take 2 tablets by mouth 2 (two) times daily for 5 days. Patient GFR is 38. Take nirmatrelvir (150 mg) one tablet twice daily for 5 days and ritonavir (100 mg) one tablet twice daily for 5 days.   predniSONE 10 MG tablet Commonly known as: DELTASONE Take 5 tablets (50 mg total) by mouth daily for 1 day, THEN 4 tablets (40 mg total) daily for 1 day, THEN 3 tablets (30 mg total) daily for 1 day, THEN 2 tablets (20 mg total) daily for 1 day, THEN  1 tablet (10 mg total) daily for 1 day. Start taking on: May 01, 2022   vitamin B-12 500 MCG tablet Commonly known as: CYANOCOBALAMIN Take 500 mcg by mouth daily.               Durable Medical Equipment  (From admission, onward)           Start     Ordered   04/30/22 1050  For home use only DME Walker rolling  Once       Question Answer Comment  Walker: With Britt Wheels   Patient needs a walker to treat with the following condition Weakness      04/30/22 1049            Discharge  Exam: Filed Weights   04/28/22 1335  Weight: 83.9 kg   General: Alert and oriented x3 Cardiovascular: Regular rate and rhythm, S1-S2 Lungs: Left basilar wheeze  Condition at discharge: good  The results of significant diagnostics from this hospitalization (including imaging, microbiology, ancillary and laboratory) are listed below for reference.   Imaging Studies: DG Chest 2 View  Result Date: 04/28/2022 CLINICAL DATA:  Cough and congestion.  Short of breath EXAM: CHEST - 2 VIEW COMPARISON:  Chest 02/10/2021 FINDINGS: Heart size and vascularity normal. Dual lead pacemaker unchanged in position. Mild elevation left hemidiaphragm unchanged. Lungs clear without infiltrate or effusion. No change from the prior study. IMPRESSION: No active cardiopulmonary disease. Electronically Signed   By: Franchot Gallo M.D.   On: 04/28/2022 14:14    Microbiology: Results for orders placed or performed during the hospital encounter of 04/28/22  Resp Panel by RT-PCR (Flu A&B, Covid) Anterior Nasal Swab     Status: Abnormal   Collection Time: 04/28/22  1:37 PM   Specimen: Anterior Nasal Swab  Result Value Ref Range Status   SARS Coronavirus 2 by RT PCR POSITIVE (A) NEGATIVE Final    Comment: (NOTE) SARS-CoV-2 target nucleic acids are DETECTED.  The SARS-CoV-2 RNA is generally detectable in upper respiratory specimens during the acute phase of infection. Positive results are indicative of the presence of the identified virus, but do not rule out bacterial infection or co-infection with other pathogens not detected by the test. Clinical correlation with patient history and other diagnostic information is necessary to determine patient infection status. The expected result is Negative.  Fact Sheet for Patients: EntrepreneurPulse.com.au  Fact Sheet for Healthcare Providers: IncredibleEmployment.be  This test is not yet approved or cleared by the [place] FDA  and  has been authorized for detection and/or diagnosis of SARS-CoV-2 by FDA under an Emergency Use Authorization (EUA).  This EUA will remain in effect (meaning this test can be used) for the duration of  the COVID-19 declaration under Section 564(b)(1) of the A ct, 21 U.S.C. section 360bbb-3(b)(1), unless the authorization is terminated or revoked sooner.     Influenza A by PCR NEGATIVE NEGATIVE Final   Influenza B by PCR NEGATIVE NEGATIVE Final    Comment: (NOTE) The Xpert Xpress SARS-CoV-2/FLU/RSV plus assay is intended as an aid in the diagnosis of influenza from Nasopharyngeal swab specimens and should not be used as a sole basis for treatment. Nasal washings and aspirates are unacceptable for Xpert Xpress SARS-CoV-2/FLU/RSV testing.  Fact Sheet for Patients: EntrepreneurPulse.com.au  Fact Sheet for Healthcare Providers: IncredibleEmployment.be  This test is not yet approved or cleared by the [place] FDA and has been authorized for detection and/or diagnosis of SARS-CoV-2 by FDA under an  Emergency Use Authorization (EUA). This EUA will remain in effect (meaning this test can be used) for the duration of the COVID-19 declaration under Section 564(b)(1) of the Act, 21 U.S.C. section 360bbb-3(b)(1), unless the authorization is terminated or revoked.  Performed at [facility], [address] [place], [place] [address]     Labs: CBC: Recent Labs  Lab 04/28/22 1439  WBC 10.2  NEUTROABS 7.2  HGB 11.7*  HCT 35.8*  MCV 99.2  PLT 123456   Basic Metabolic Panel: Recent Labs  Lab 04/28/22 1547 04/30/22 0651  NA 139 139  K 4.3 4.9  CL 106 106  CO2 25 26  GLUCOSE 117* 144*  BUN 27* 41*  CREATININE 1.29* 1.56*  CALCIUM 9.4 9.5   Liver Function Tests: Recent Labs  Lab 04/28/22 1547  AST 18  ALT 15  ALKPHOS 29*  BILITOT 0.8  PROT 7.1  ALBUMIN 3.7   CBG: Recent Labs  Lab 04/29/22 1049  GLUCAP 92     Discharge time spent: less than 30 minutes.  Signed: Annita Brod, MD [facility] 04/30/2022

## 2022-04-30 NOTE — TOC Initial Note (Signed)
Transition of Care (TOC) - Initial/Assessment Note    Patient Details  Name: Elijah MATHEW Sr. MRN: [id number] Date of Birth: [birth year]  [facility] CM/SW Contact:    Beverly Sessions, RN Phone Number: 04/30/2022, 11:16 AM  Clinical Narrative:                    Admitted for: Covid  Admitted from: Home alone PCP: H[facility]  Current home health/prior home health/DME: [facility]  Patient request RW for home. Referral made to [facility] with [employer] Patient agreeable to home health for RN and PT.  Patient states he does not have a preference of agency.  Referral made [facility] with [facility].   Patient states his daughter will transport at discharge       Patient Goals and CMS Choice        Expected Discharge Plan and Services                                                Prior Living Arrangements/Services                       Activities of Daily Living Home Assistive Devices/Equipment: Eyeglasses ADL Screening (condition at time of admission) Patient's cognitive ability adequate to safely complete daily activities?: Yes Is the patient deaf or have difficulty hearing?: No Does the patient have difficulty seeing, even when wearing glasses/contacts?: No Does the patient have difficulty concentrating, remembering, or making decisions?: No Patient able to express need for assistance with ADLs?: Yes Does the patient have difficulty dressing or bathing?: No Independently performs ADLs?: Yes (appropriate for developmental age) Does the patient have difficulty walking or climbing stairs?: No Weakness of Legs: None Weakness of Arms/Hands: None  Permission Sought/Granted                  Emotional Assessment              Admission diagnosis:  Acute respiratory failure with hypoxia ([place]) [J96.01] Acute hypoxemic respiratory failure ([place]) [J96.01] COVID-19 [U07.1] COVID-19 virus infection [U07.1] Patient Active Problem List    Diagnosis Date Noted   Overweight (BMI 25.0-29.9) 04/29/2022   Acute hypoxemic respiratory failure ([place]) 04/28/2022   Stage 3a chronic kidney disease (CKD) ([place]) 04/28/2022   Generalized weakness 02/10/2021   COVID-19 virus infection 02/10/2021   Fall 02/10/2021   Stroke ([place])    Healthcare maintenance 05/19/2017   TIA (transient ischemic attack) 11/09/2016   DOE (dyspnea on exertion) 07/09/2016   Pacemaker 02/14/2014   History of TIA (transient ischemic attack) 02/03/2014   Asthma 01/14/2014   CKD (chronic kidney disease), stage III ([place]) 01/14/2014   GERD (gastroesophageal reflux disease) 01/14/2014   PCP:  Tracie Harrier, MD Pharmacy:   [place], [place] - [address] [place] [address] Phone: [phone] Fax: [phone]  [facility] [place], [place] - [place] [address] [place] [address] Phone: [phone] Fax: [phone]     Social Determinants of Health (SDOH) Interventions    Readmission Risk Interventions     No data to display

## 2022-04-30 NOTE — Progress Notes (Signed)
2000-Patient jumped out of bed numerous times causing bed alarm to go off. Educated patient regarding not getting out of bed without calling first for safety reasons and to prevent injury. After leaving patient's room, he got out of bed again and when staff went into room, he began pointing staff in the chest demanding that his medicines be given now. Instructed patient that he cannot put his hands on staff. Patient began to curse  at staff. Another staff member was able to get patient back into bed but he refused the bed alarm. Initially refused vitals. Tech finally obtained at a later time.        0140-Patient comes out of room onto unit fully dressed asking staff to call the police because he does not know where he is. Several staff members tried to explain to patient where he was, how he got here and why he was here but patient continued to talk over everyone. Explained to patient that he has covid and that he has to stay in his room for infectious reasons. Patient finally went back to room and sat in chair. Refuses to get into bed.

## 2022-04-30 NOTE — Plan of Care (Signed)
Patient  on RA, encouraged to [place] IS and flutter valve

## 2022-05-16 ENCOUNTER — Other Ambulatory Visit: Payer: Self-pay

## 2022-05-16 ENCOUNTER — Emergency Department
Admission: EM | Admit: 2022-05-16 | Discharge: 2022-05-16 | Disposition: A | Discharge status: Home / Self Care | Payer: Medicare Other | Attending: Emergency Medicine | Admitting: Emergency Medicine

## 2022-05-16 ENCOUNTER — Emergency Department: Payer: Medicare Other

## 2022-05-16 DIAGNOSIS — N189 Chronic kidney disease, unspecified: Secondary | ICD-10-CM | POA: Insufficient documentation

## 2022-05-16 DIAGNOSIS — R0602 Shortness of breath: Secondary | ICD-10-CM

## 2022-05-16 DIAGNOSIS — I509 Heart failure, unspecified: Secondary | ICD-10-CM | POA: Insufficient documentation

## 2022-05-16 DIAGNOSIS — I2510 Atherosclerotic heart disease of native coronary artery without angina pectoris: Secondary | ICD-10-CM | POA: Insufficient documentation

## 2022-05-16 LAB — CBC
HCT: 32.9 % — ABNORMAL LOW (ref 39.0–52.0)
Hemoglobin: 10.7 g/dL — ABNORMAL LOW (ref 13.0–17.0)
MCH: 32.4 pg (ref 26.0–34.0)
MCHC: 32.5 g/dL (ref 30.0–36.0)
MCV: 99.7 fL (ref 80.0–100.0)
Platelets: 215 10*3/uL (ref 150–400)
RBC: 3.3 MIL/uL — ABNORMAL LOW (ref 4.22–5.81)
RDW: 12.9 % (ref 11.5–15.5)
WBC: 9.1 10*3/uL (ref 4.0–10.5)
nRBC: 0 % (ref 0.0–0.2)

## 2022-05-16 LAB — TROPONIN I (HIGH SENSITIVITY): Troponin I (High Sensitivity): 6 ng/L (ref <18)

## 2022-05-16 LAB — BASIC METABOLIC PANEL
Anion gap: 5 (ref 5–15)
BUN: 33 mg/dL — ABNORMAL HIGH (ref 8–23)
CO2: 25 mmol/L (ref 22–32)
Calcium: 9.2 mg/dL (ref 8.9–10.3)
Chloride: 107 mmol/L (ref 98–111)
Creatinine, Ser: 1.29 mg/dL — ABNORMAL HIGH (ref 0.61–1.24)
GFR, Estimated: 51 mL/min — ABNORMAL LOW (ref >60)
Glucose, Bld: 104 mg/dL — ABNORMAL HIGH (ref 70–99)
Potassium: 4.2 mmol/L (ref 3.5–5.1)
Sodium: 137 mmol/L (ref 135–145)

## 2022-05-16 NOTE — ED Provider Notes (Signed)
[facility] Provider Note    Event Date/Time   First MD Initiated Contact with Patient 05/16/22 2007     (approximate)   History   Chief Complaint Shortness of Breath   HPI  Elijah Collins. is a 88 y.o. male with past medical history of hyperlipidemia, CAD, CKD, CHF, and stroke who presents to the ED complaining of shortness of breath.  Patient reports that earlier today he was walking around the grocery store when he began to feel short of breath.  He checked his oxygen later at home using his home pulse oximeter and found it to be 79%.  He does not wear oxygen at baseline, does state that for about the past year he has been getting more out of breath than usual.  This has gotten worse since he was admitted for COVID-19 at the end of last month.  He denies significant issues with his breathing currently, states he is feeling better after resting.  He denies any recent fevers or cough and has not had any pain in his chest.     Physical Exam   Triage Vital Signs: ED Triage Vitals  Enc Vitals Group     BP 05/16/22 1526 (!) 149/73     Pulse Rate 05/16/22 1526 81     Resp 05/16/22 1526 18     Temp 05/16/22 1526 97.9 F (36.6 C)     Temp Source 05/16/22 1526 Oral     SpO2 05/16/22 1526 96 %     Weight 05/16/22 1523 184 lb 15.5 oz (83.9 kg)     Height 05/16/22 1523 5\' 10"  (1.778 m)     Head Circumference --      Peak Flow --      Pain Score 05/16/22 1523 0     Pain Loc --      Pain Edu? --      Excl. in [place]? --     Most recent vital signs: Vitals:   05/16/22 2025 05/16/22 2025  BP: 121/87   Pulse: 69   Resp: 18   Temp:  (!) 97.5 F (36.4 C)  SpO2: 97%     Constitutional: Alert and oriented. Eyes: Conjunctivae are normal. Head: Atraumatic. Nose: No congestion/rhinnorhea. Mouth/Throat: Mucous membranes are moist.  Cardiovascular: Normal rate, regular rhythm. Grossly normal heart sounds.  2+ radial pulses bilaterally. Respiratory: Normal  respiratory effort.  No retractions. Lungs CTAB. Gastrointestinal: Soft and nontender. No distention. Musculoskeletal: No lower extremity tenderness nor edema.  Neurologic:  Normal speech and language. No gross focal neurologic deficits are appreciated.    ED Results / Procedures / Treatments   Labs (all labs ordered are listed, but only abnormal results are displayed) Labs Reviewed  BASIC METABOLIC PANEL - Abnormal; Notable for the following components:      Result Value   Glucose, Bld 104 (*)    BUN 33 (*)    Creatinine, Ser 1.29 (*)    GFR, Estimated 51 (*)    All other components within normal limits  CBC - Abnormal; Notable for the following components:   RBC 3.30 (*)    Hemoglobin 10.7 (*)    HCT 32.9 (*)    All other components within normal limits  TROPONIN I (HIGH SENSITIVITY)  TROPONIN I (HIGH SENSITIVITY)     EKG  ED ECG REPORT I, Blake Divine, the attending physician, personally viewed and interpreted this ECG.   Date: 05/16/2022  EKG Time: 15:29  Rate: 70  Rhythm: Atrial paced rhythm  Axis: Normal  Intervals:none  ST&T Change: None  RADIOLOGY Chest x-ray reviewed and interpreted by me with no infiltrate, edema, or effusion.  PROCEDURES:  Critical Care performed: No  Procedures   MEDICATIONS ORDERED IN ED: Medications - No data to display   IMPRESSION / MDM / [place] / ED COURSE  I reviewed the triage vital signs and the nursing notes.                              88 y.o. male with past medical history of hyperlipidemia, CAD, CHF, CKD, and stroke who presents to the ED for episode of difficulty breathing earlier today while out getting groceries.  Patient's presentation is most consistent with acute presentation with potential threat to life or bodily function.  Differential diagnosis includes, but is not limited to, ACS, PE, pneumonia, CHF, COPD, COVID-19.  Patient nontoxic-appearing and in no acute distress, vital signs  are unremarkable and he is currently maintaining oxygen saturation at 97% on room air.  He is currently asymptomatic and lungs are clear to auscultation bilaterally, he is asking how soon he can go home on my initial evaluation.  EKG shows no evidence of arrhythmia or ischemia and troponin within normal limits.  Renal function stable compared to previous, no significant anemia, leukocytosis, electrolyte abnormality noted.  Given he is currently asymptomatic, doubt PE.  Chest x-ray shows no evidence of pneumonia and I doubt ACS.  He was ambulatory here in the ED without difficulty, maintained oxygen saturations with ambulation.  He is appropriate for discharge home with PCP follow-up, was counseled to return to the ED for new or worsening symptoms.  Patient agrees with plan.      FINAL CLINICAL IMPRESSION(S) / ED DIAGNOSES   Final diagnoses:  Shortness of breath     Rx / DC Orders   ED Discharge Orders     None        Note:  This document was prepared using Dragon voice recognition software and may include unintentional dictation errors.   Blake Divine, MD 05/16/22 2040

## 2022-05-16 NOTE — ED Provider Triage Note (Cosign Needed Addendum)
  Emergency Medicine Provider Triage Evaluation Note  Elijah Collins Sr. , a 88 y.o.male,  was evaluated in triage.  Pt complains of low oxygen level.  He states that he was using his pulse oximeter today, which showed that his oxygen saturation was in the 70s after he walked into the house after carrying groceries.  However, he states that since being here, his oxygen saturation has been 94%.  He states that he feels fine and has not had any symptoms, however figured that he should get checked out.   Review of Systems  Positive: Low oxygen saturation Negative: Denies fever, chest pain, vomiting  Physical Exam   Vitals:   05/16/22 1526  BP: (!) 149/73  Pulse: 81  Resp: 18  Temp: 97.9 F (36.6 C)  SpO2: 96%   Gen:   Awake, no distress   Resp:  Normal effort  MSK:   Moves extremities without difficulty  Other:    Medical Decision Making  Given the patient's initial medical screening exam, the following diagnostic evaluation has been ordered. The patient will be placed in the appropriate treatment space, once one is available, to complete the evaluation and treatment. I have discussed the plan of care with the patient and I have advised the patient that an ED physician or mid-level practitioner will reevaluate their condition after the test results have been received, as the results may give them additional insight into the type of treatment they may need.    Diagnostics: Labs, EKG, CXR  Treatments: none immediately   Teodoro Spray, PA 05/16/22 [place], [place], [place] 05/16/22 1539

## 2022-05-16 NOTE — ED Triage Notes (Addendum)
Pt reports he was out getting groceries and came home and started to feel more SOB. Pt checked his oxygen at home and it read 79%. Pt denies PRN oxygen. Pt also reports CP and cough. Pt states diagnosed with COVID 2 weeks ago and had to be admitted for low oxygen. Pt on plavix .   Pt pulse ox reading 96% currently and ER pulse ox reading 97%.

## 2022-05-16 NOTE — ED Notes (Signed)
Pt ambulated in hall with pulse ox per verbal order EDP [place].  Sats stayed between 90 and 100.  90-91 for a approx 0.5 seconds  then back up to 95. Pt denies SHOB at this time. [place] notified.

## 2022-06-01 ENCOUNTER — Ambulatory Visit
Admission: RE | Admit: 2022-06-01 | Discharge: 2022-06-01 | Disposition: A | Discharge status: Home / Self Care | Payer: Medicare Other | Source: Ambulatory Visit | Attending: Internal Medicine | Admitting: Internal Medicine

## 2022-06-01 ENCOUNTER — Other Ambulatory Visit: Payer: Self-pay | Admitting: Internal Medicine

## 2022-06-01 DIAGNOSIS — Z8616 Personal history of COVID-19: Secondary | ICD-10-CM | POA: Insufficient documentation

## 2022-06-01 DIAGNOSIS — R0902 Hypoxemia: Secondary | ICD-10-CM | POA: Diagnosis present

## 2022-06-01 MED ORDER — IOHEXOL 350 MG/ML SOLN
75.0000 mL | Freq: Once | INTRAVENOUS | Status: AC | PRN
  Administered 2022-06-01: 75 mL via INTRAVENOUS

## 2022-07-26 ENCOUNTER — Other Ambulatory Visit: Payer: Self-pay

## 2022-07-26 ENCOUNTER — Emergency Department: Payer: Medicare Other

## 2022-07-26 ENCOUNTER — Emergency Department
Admission: EM | Admit: 2022-07-26 | Discharge: 2022-07-26 | Disposition: A | Discharge status: Home / Self Care | Payer: Medicare Other | Attending: Emergency Medicine | Admitting: Emergency Medicine

## 2022-07-26 DIAGNOSIS — Z950 Presence of cardiac pacemaker: Secondary | ICD-10-CM | POA: Diagnosis not present

## 2022-07-26 DIAGNOSIS — J45909 Unspecified asthma, uncomplicated: Secondary | ICD-10-CM | POA: Diagnosis not present

## 2022-07-26 DIAGNOSIS — R0602 Shortness of breath: Secondary | ICD-10-CM | POA: Insufficient documentation

## 2022-07-26 DIAGNOSIS — R079 Chest pain, unspecified: Secondary | ICD-10-CM | POA: Diagnosis not present

## 2022-07-26 DIAGNOSIS — Z8616 Personal history of COVID-19: Secondary | ICD-10-CM | POA: Diagnosis not present

## 2022-07-26 DIAGNOSIS — N1831 Chronic kidney disease, stage 3a: Secondary | ICD-10-CM | POA: Diagnosis not present

## 2022-07-26 LAB — TROPONIN I (HIGH SENSITIVITY)
Troponin I (High Sensitivity): 6 ng/L (ref <18)
Troponin I (High Sensitivity): 7 ng/L (ref <18)

## 2022-07-26 LAB — BASIC METABOLIC PANEL
Anion gap: 6 (ref 5–15)
BUN: 27 mg/dL — ABNORMAL HIGH (ref 8–23)
CO2: 23 mmol/L (ref 22–32)
Calcium: 9.1 mg/dL (ref 8.9–10.3)
Chloride: 111 mmol/L (ref 98–111)
Creatinine, Ser: 1.2 mg/dL (ref 0.61–1.24)
GFR, Estimated: 56 mL/min — ABNORMAL LOW (ref >60)
Glucose, Bld: 136 mg/dL — ABNORMAL HIGH (ref 70–99)
Potassium: 4 mmol/L (ref 3.5–5.1)
Sodium: 140 mmol/L (ref 135–145)

## 2022-07-26 LAB — CBC
HCT: 34.8 % — ABNORMAL LOW (ref 39.0–52.0)
Hemoglobin: 11.1 g/dL — ABNORMAL LOW (ref 13.0–17.0)
MCH: 32.3 pg (ref 26.0–34.0)
MCHC: 31.9 g/dL (ref 30.0–36.0)
MCV: 101.2 fL — ABNORMAL HIGH (ref 80.0–100.0)
Platelets: 208 10*3/uL (ref 150–400)
RBC: 3.44 MIL/uL — ABNORMAL LOW (ref 4.22–5.81)
RDW: 12.8 % (ref 11.5–15.5)
WBC: 6.1 10*3/uL (ref 4.0–10.5)
nRBC: 0 % (ref 0.0–0.2)

## 2022-07-26 LAB — PROTIME-INR
INR: 1 (ref 0.8–1.2)
Prothrombin Time: 12.9 seconds (ref 11.4–15.2)

## 2022-07-26 MED ORDER — ISOSORBIDE MONONITRATE ER 30 MG PO TB24: 30.0000 mg | ORAL_TABLET | Freq: Every day | ORAL | 11 refills | Status: DC

## 2022-07-26 MED ORDER — ISOSORBIDE MONONITRATE ER 60 MG PO TB24
30.0000 mg | ORAL_TABLET | Freq: Every day | ORAL | Status: DC
  Administered 2022-07-26: 30 mg via ORAL
  Filled 2022-07-26: qty 1

## 2022-07-26 MED ORDER — ASPIRIN 81 MG PO CHEW
324.0000 mg | CHEWABLE_TABLET | Freq: Once | ORAL | Status: AC
  Administered 2022-07-26: 324 mg via ORAL
  Filled 2022-07-26: qty 4

## 2022-07-26 NOTE — ED Provider Notes (Signed)
-----------------------------------------   8:44 PM on 07/26/2022 ----------------------------------------- Repeat troponin is unchanged/negative.  Patient will be discharged to the hospital will follow-up with cardiology on the 26th.  Discussed return precautions if the pain is to return/worsen patient should return to the emergency department.   Minna Antis, MD 07/26/22 2045

## 2022-07-26 NOTE — Discharge Instructions (Addendum)
Please start taking the Imdur once daily for your chest pain.  Continue taking aspirin and Plavix.  Return to the emergency department if your chest pain is worsening or not going away at rest.  Please call Dr. Briscoe Burns office on the 20 6 in the morning.  If he they are unable to see you or you are unable to get in touch with them please see Dr. Welton Flakes at 10 AM on the 26th at [facility].

## 2022-07-26 NOTE — ED Triage Notes (Signed)
Pt states he has been having chest pain off and on for about 10 days- pt states today the episode lasted a few hours- pt states when he has the cp it is in the center of his chest

## 2022-07-26 NOTE — ED Notes (Signed)
E signature pad not working. Pt educated on discharge instructions and verbalized understanding.  

## 2022-07-26 NOTE — ED Provider Notes (Signed)
[facility] Provider Note    Event Date/Time   First MD Initiated Contact with Patient 07/26/22 1813     (approximate)   History   Chest Pain   HPI  Elijah Collins. is a 88 y.o. male past medical history of hyperlipidemia, bradycardia status post pacemaker, TIA who presents because of chest pain.  Patient tells me that over the last 2 weeks he has had episodes of chest pain.  Typically occurs with exertion.  Described as pressure-like sensation in the center of his chest that does not radiate.  It is sometimes associated with shortness of breath but no nausea or diaphoresis.  Felt it last night when he was walking [place] a stadium.  Also felt pain walking up and down the steps today.  Did have an episode of pain earlier but has been pain-free over the last 2 hours.  Dr. Briscoe Burns last note 20 days ago on 12/4, patient had Lexiscan Myoview in August that was negative for ischemia.  He is on aspirin and Plavix currently.     Past Medical History:  Diagnosis Date   Asthma    Cardiac arrhythmia    CHF (congestive heart failure) (HCC)    Hyperlipidemia    Shortness of breath    Stroke [facility])    TIA (transient ischemic attack)     Patient Active Problem List   Diagnosis Date Noted   Overweight (BMI 25.0-29.9) 04/29/2022   Stage 3a chronic kidney disease (CKD) (HCC) 04/28/2022   Generalized weakness 02/10/2021   COVID-19 virus infection 02/10/2021   Fall 02/10/2021   Stroke (HCC)    Healthcare maintenance 05/19/2017   TIA (transient ischemic attack) 11/09/2016   DOE (dyspnea on exertion) 07/09/2016   Pacemaker 02/14/2014   History of TIA (transient ischemic attack) 02/03/2014   Asthma 01/14/2014   CKD (chronic kidney disease), stage III (HCC) 01/14/2014   GERD (gastroesophageal reflux disease) 01/14/2014     Physical Exam  Triage Vital Signs: ED Triage Vitals  Enc Vitals Group     BP 07/26/22 1405 114/66     Pulse Rate 07/26/22 1405  61     Resp 07/26/22 1405 18     Temp 07/26/22 1405 97.8 F (36.6 C)     Temp src --      SpO2 07/26/22 1405 98 %     Weight 07/26/22 1405 190 lb (86.2 kg)     Height 07/26/22 1405 5\' 10"  (1.778 m)     Head Circumference --      Peak Flow --      Pain Score 07/26/22 1419 0     Pain Loc --      Pain Edu? --      Excl. in [place]? --     Most recent vital signs: Vitals:   07/26/22 1405  BP: 114/66  Pulse: 61  Resp: 18  Temp: 97.8 F (36.6 C)  SpO2: 98%     General: Awake, no distress.  CV:  Good peripheral perfusion. Minimal pitting edema Resp:  Normal effort. Lungs are clear Abd:  No distention.  Neuro:             Awake, Alert, Oriented x 3  Other:     ED Results / Procedures / Treatments  Labs (all labs ordered are listed, but only abnormal results are displayed) Labs Reviewed  BASIC METABOLIC PANEL - Abnormal; Notable for the following components:      Result Value  Glucose, Bld 136 (*)    BUN 27 (*)    GFR, Estimated 56 (*)    All other components within normal limits  CBC - Abnormal; Notable for the following components:   RBC 3.44 (*)    Hemoglobin 11.1 (*)    HCT 34.8 (*)    MCV 101.2 (*)    All other components within normal limits  PROTIME-INR  TROPONIN I (HIGH SENSITIVITY)  TROPONIN I (HIGH SENSITIVITY)     EKG  EKG reviewed interpreted myself shows atrial paced rhythm normal axis prolonged AV conduction, no acute ischemic changes   RADIOLOGY I reviewed and interpreted the CXR which does not show any acute cardiopulmonary process    PROCEDURES:  Critical Care performed: No  Procedures  The patient is on the cardiac monitor to evaluate for evidence of arrhythmia and/or significant heart rate changes.   MEDICATIONS ORDERED IN ED: Medications  aspirin chewable tablet 324 mg (has no administration in time range)  isosorbide mononitrate (IMDUR) 24 hr tablet 30 mg (has no administration in time range)     IMPRESSION / MDM / ASSESSMENT  AND PLAN / ED COURSE  I reviewed the triage vital signs and the nursing notes.                              Patient's presentation is most consistent with acute presentation with potential threat to life or bodily function.  Differential diagnosis includes, but is not limited to, angina, unstable angina, less likely dissection, PE  Patient is a 88 year old male presenting with 2 weeks of exertional chest pain.  It is pressure-like and is typically brought on with exertion such as going up stairs and walking around the house.  Typically is relieved with rest.  Associated with dyspnea but no nausea or diaphoresis.  Patient's EKG shows a paced rhythm without obvious ischemic changes.  First troponin is negative.  Labs are otherwise reassuring.  Chest x-ray with some bibasilar atelectasis but no other acute process.  Patient has had a nuclear stress test in August that was negative because he was having exertional dyspnea.  However with this new exertional chest pain I am certainly concerned about unstable angina.  Patient would like to be discharged as it is Christmas Eve.  I discussed with on-call cardiologist for [facility] clinic Dr. Jeanie Cooks who thinks it is reasonable for the patient to be discharged given he is pain-free now.  He recommended starting him on Imdur 30 mg daily and patient is already taking aspirin and Plavix so he should continue this.  If Dr. Darrold Junker who is his cardiologist cannot see him on the 26, patient can follow-up with Dr. Welton Flakes.  Discussed this with the patient and he is comfortable with plan to go home.  I did discuss that if his pain is worsening or is not relieved with rest that he should return to the emergency department this is concerning for MI.  Given we have a plan for follow-up and patient prefers to be discharged I think this is appropriate.  He does need a repeat troponin and is pending this at the time of signout.       FINAL CLINICAL IMPRESSION(S) / ED  DIAGNOSES   Final diagnoses:  Chest pain, unspecified type     Rx / DC Orders   ED Discharge Orders          Ordered    isosorbide mononitrate (IMDUR)  30 MG 24 hr tablet  Daily        07/26/22 1909    Ambulatory referral to Cardiology        07/26/22 1910             Note:  This document was prepared using Dragon voice recognition software and may include unintentional dictation errors.   Georga Hacking, MD 07/26/22 [phone]

## 2022-07-26 NOTE — ED Triage Notes (Signed)
Pt from home. Pt called EMS due to chest pain on and off for the last week, but today it is staying constant. Pt reporting Shortness of breath. NO N/V   EMS vitals: 159/90 78 99% RA

## 2022-08-07 ENCOUNTER — Other Ambulatory Visit: Payer: Self-pay | Admitting: Pulmonary Disease

## 2022-08-07 DIAGNOSIS — J849 Interstitial pulmonary disease, unspecified: Secondary | ICD-10-CM

## 2022-08-19 ENCOUNTER — Ambulatory Visit
Admission: RE | Admit: 2022-08-19 | Discharge: 2022-08-19 | Disposition: A | Discharge status: Home / Self Care | Payer: Medicare Other | Source: Ambulatory Visit | Attending: Pulmonary Disease | Admitting: Pulmonary Disease

## 2022-08-19 DIAGNOSIS — J849 Interstitial pulmonary disease, unspecified: Secondary | ICD-10-CM | POA: Insufficient documentation

## 2022-09-04 ENCOUNTER — Telehealth: Payer: Self-pay

## 2022-09-04 NOTE — Telephone Encounter (Signed)
Introductory phone call made to patient prior to his new patient appointment with Dr. Tasia Catchings on 09/07/22 at 9:30. 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 He was confused as to why he was coming to the [facility] for anemia. I explained to him that our physicians are Hematologist as well so they see blood conditions such as anemia. I told patient that I look forward to meeting him on Monday.

## 2022-09-07 ENCOUNTER — Inpatient Hospital Stay: Payer: Medicare Other | Attending: Oncology | Admitting: Oncology

## 2022-09-07 ENCOUNTER — Encounter: Payer: Self-pay | Admitting: Oncology

## 2022-09-07 ENCOUNTER — Inpatient Hospital Stay: Payer: Medicare Other

## 2022-09-07 VITALS — BP 117/64 | HR 67 | Temp 97.5°F | Resp 18 | Wt 196.3 lb

## 2022-09-07 DIAGNOSIS — I509 Heart failure, unspecified: Secondary | ICD-10-CM | POA: Insufficient documentation

## 2022-09-07 DIAGNOSIS — D539 Nutritional anemia, unspecified: Secondary | ICD-10-CM | POA: Insufficient documentation

## 2022-09-07 DIAGNOSIS — D472 Monoclonal gammopathy: Secondary | ICD-10-CM | POA: Insufficient documentation

## 2022-09-07 LAB — HEPATIC FUNCTION PANEL
ALT: 16 U/L (ref 0–44)
AST: 20 U/L (ref 15–41)
Albumin: 3.9 g/dL (ref 3.5–5.0)
Alkaline Phosphatase: 27 U/L — ABNORMAL LOW (ref 38–126)
Bilirubin, Direct: <0.1 mg/dL (ref 0.0–0.2)
Total Bilirubin: 0.3 mg/dL (ref 0.3–1.2)
Total Protein: 7.3 g/dL (ref 6.5–8.1)

## 2022-09-07 LAB — CBC WITH DIFFERENTIAL/PLATELET
Abs Immature Granulocytes: 0.03 K/uL (ref 0.00–0.07)
Basophils Absolute: 0 K/uL (ref 0.0–0.1)
Basophils Relative: 0 %
Eosinophils Absolute: 0.5 K/uL (ref 0.0–0.5)
Eosinophils Relative: 7 %
HCT: 35 % — ABNORMAL LOW (ref 39.0–52.0)
Hemoglobin: 11.7 g/dL — ABNORMAL LOW (ref 13.0–17.0)
Immature Granulocytes: 0 %
Lymphocytes Relative: 33 %
Lymphs Abs: 2.3 K/uL (ref 0.7–4.0)
MCH: 32.5 pg (ref 26.0–34.0)
MCHC: 33.4 g/dL (ref 30.0–36.0)
MCV: 97.2 fL (ref 80.0–100.0)
Monocytes Absolute: 0.5 K/uL (ref 0.1–1.0)
Monocytes Relative: 8 %
Neutro Abs: 3.5 K/uL (ref 1.7–7.7)
Neutrophils Relative %: 52 %
Platelets: 211 K/uL (ref 150–400)
RBC: 3.6 MIL/uL — ABNORMAL LOW (ref 4.22–5.81)
RDW: 12.3 % (ref 11.5–15.5)
WBC: 6.8 K/uL (ref 4.0–10.5)
nRBC: 0 % (ref 0.0–0.2)

## 2022-09-07 LAB — FERRITIN: Ferritin: 68 ng/mL (ref 24–336)

## 2022-09-07 LAB — IRON AND TIBC
Iron: 94 ug/dL (ref 45–182)
Saturation Ratios: 24 % (ref 17.9–39.5)
TIBC: 399 ug/dL (ref 250–450)
UIBC: 305 ug/dL

## 2022-09-07 LAB — FOLATE: Folate: 16.2 ng/mL (ref >5.9)

## 2022-09-07 LAB — VITAMIN B12: Vitamin B-12: 637 pg/mL (ref 180–914)

## 2022-09-07 NOTE — Assessment & Plan Note (Addendum)
Ddx: anemia due to CKD, nutritional anemia, vs bone marrow disorders.  Check cbc LFT, B12, folate, myeloma panel, light chain ratio. Iron tibc ferritin Shared decision made with patient no to proceed with aggressive work up including bone marrow biopsy if his blood work provides no good explanation of his macrocytic anemia.  He is interested in conservative medical management if needed and be observed.

## 2022-09-07 NOTE — Progress Notes (Signed)
Hematology/Oncology Consult note [phone] [phone]      Patient Care Team: Tracie Harrier, MD as PCP - General (Internal Medicine)   REFERRING PROVIDER: Tracie Harrier, MD  CHIEF COMPLAINTS/REASON FOR VISIT:  Macrocytic Anemia  ASSESSMENT & PLAN:  Macrocytic anemia Ddx: anemia due to CKD, nutritional anemia, vs bone marrow disorders.  Check cbc LFT, B12, folate, myeloma panel, light chain ratio. Iron tibc ferritin Shared decision made with patient no to proceed with aggressive work up including bone marrow biopsy if his blood work provides no good explanation of his macrocytic anemia.  He is interested in conservative medical management if needed and be observed.   Orders Placed This Encounter  Procedures   Vitamin B12    Standing Status:   Future    Number of Occurrences:   1    Standing Expiration Date:   09/08/2023   Folate    Standing Status:   Future    Number of Occurrences:   1    Standing Expiration Date:   09/08/2023   Ferritin    Standing Status:   Future    Number of Occurrences:   1    Standing Expiration Date:   03/08/2023   Iron and TIBC    Standing Status:   Future    Number of Occurrences:   1    Standing Expiration Date:   09/08/2023   CBC with Differential/Platelet    Standing Status:   Future    Number of Occurrences:   1    Standing Expiration Date:   09/08/2023   Hepatic function panel    Standing Status:   Future    Number of Occurrences:   1    Standing Expiration Date:   09/07/2023   Multiple Myeloma Panel (SPEP&IFE w/QIG)    Standing Status:   Future    Number of Occurrences:   1    Standing Expiration Date:   09/08/2023   Kappa/lambda light chains    Standing Status:   Future    Number of Occurrences:   1    Standing Expiration Date:   09/08/2023   CBC with Differential/Platelet    Standing Status:   Future    Standing Expiration Date:   09/07/2023   Comprehensive metabolic panel    Standing Status:   Future     Standing Expiration Date:   09/07/2023   Follow up in  3 months.  All questions were answered. The patient knows to call the clinic with any problems, questions or concerns.  Earlie Server, MD, PhD [facility] Hematology Oncology 09/07/2022     HISTORY OF PRESENTING ILLNESS:  Elijah Scrima. is a  89 y.o.  male with PMH listed below who was referred to me for anemia Reviewed patient's recent labs that was done.  He was found to have chronic anemia, and MCV is trending up.  Since his COVID 19 infection , has experienced SOB, fatigue. Was seen by pulmonology recently.   He had not noticed any recent bleeding such as epistaxis, hematuria or hematochezia.  He denies over the counter NSAID ingestion. He is on antiplatelets agent - Aspirin 81mg  daily and Plavix. He eats TV lunches and dinners.    MEDICAL HISTORY:  Past Medical History:  Diagnosis Date   Asthma    Cardiac arrhythmia    CHF (congestive heart failure) (HCC)    Hyperlipidemia    Shortness of breath    Stroke [facility])    TIA (  transient ischemic attack)     SURGICAL HISTORY: Past Surgical History:  Procedure Laterality Date   APPENDECTOMY  2011   [facility]   HERNIA REPAIR Right 10 years ago   [facility] Left 07/11/2019   Procedure: PACEMAKER CHANGEOUT;  Surgeon: Isaias Cowman, MD;  Location: [facility] ORS;  Service: Cardiovascular;  Laterality: Left;    SOCIAL HISTORY: Social History   Socioeconomic History   Marital status: Widowed    Spouse name: Not on file   Number of children: Not on file   Years of education: Not on file   Highest education level: Not on file  Occupational History   Not on file  Tobacco Use   Smoking status: Never   Smokeless tobacco: Never  Vaping Use   Vaping Use: Never used  Substance and Sexual Activity   Alcohol use: No   Drug use: No   Sexual activity: Not Currently  Other Topics Concern   Not on file  Social History Narrative   Not on  file   Social Determinants of Health   Financial Resource Strain: Low Risk  (06/26/2019)   Overall Financial Resource Strain (CARDIA)    Difficulty of Paying Living Expenses: Not hard at all  Food Insecurity: No Food Insecurity (09/07/2022)   Hunger Vital Sign    Worried About Running Out of Food in the Last Year: Never true    [place] in the Last Year: Never true  Transportation Needs: No Transportation Needs (09/07/2022)   PRAPARE - Hydrologist (Medical): No    Lack of Transportation (Non-Medical): No  Physical Activity: Sufficiently Active (06/26/2019)   Exercise Vital Sign    Days of Exercise per Week: 7 days    Minutes of Exercise per Session: 60 min  Stress: No Stress Concern Present (06/26/2019)   [place]    Feeling of Stress : Only a little  Social Connections: Moderately Isolated (06/26/2019)   Social Connection and Isolation Panel [NHANES]    Frequency of Communication with Friends and Family: More than three times a week    Frequency of Social Gatherings with Friends and Family: More than three times a week    Attends Religious Services: 1 to 4 times per year    Active Member of [employer] or Organizations: No    Attends Archivist Meetings: Never    Marital Status: Never married  Intimate Partner Violence: Not At Risk (09/07/2022)   Humiliation, Afraid, Rape, and Kick questionnaire    Fear of Current or Ex-Partner: No    Emotionally Abused: No    Physically Abused: No    Sexually Abused: No    FAMILY HISTORY: Family History  Problem Relation Age of Onset   CVA Mother    COPD Father     ALLERGIES:  has No Known Allergies.  MEDICATIONS:  Current Outpatient Medications  Medication Sig Dispense Refill   aspirin 81 MG chewable tablet Chew 81 mg by mouth at bedtime.     clopidogrel (PLAVIX) 75 MG tablet Take 75 mg by mouth daily.     isosorbide mononitrate  (IMDUR) 30 MG 24 hr tablet Take 1 tablet (30 mg total) by mouth daily. 30 tablet 11   Multiple Vitamin (MULTI-VITAMINS) TABS Take 1 tablet by mouth daily.     torsemide (DEMADEX) 20 MG tablet Take 20 mg by mouth daily.  traZODone (DESYREL) 50 MG tablet Take 1 tablet by mouth at bedtime.     vitamin B-12 (CYANOCOBALAMIN) 500 MCG tablet Take 500 mcg by mouth daily.     guaiFENesin-dextromethorphan (ROBITUSSIN DM) 100-10 MG/5ML syrup Take 10 mLs by mouth every 4 (four) hours as needed for cough. (Patient not taking: Reported on 09/07/2022) 118 mL 0   No current facility-administered medications for this visit.    Review of Systems  Constitutional:  Positive for fatigue. Negative for appetite change, chills, fever and unexpected weight change.  HENT:   Negative for hearing loss and voice change.   Eyes:  Negative for eye problems and icterus.  Respiratory:  Positive for shortness of breath. Negative for chest tightness and cough.   Cardiovascular:  Negative for chest pain and leg swelling.  Gastrointestinal:  Negative for abdominal distention and abdominal pain.  Endocrine: Negative for hot flashes.  Genitourinary:  Negative for difficulty urinating, dysuria and frequency.   Musculoskeletal:  Negative for arthralgias.  Skin:  Negative for itching and rash.  Neurological:  Negative for light-headedness and numbness.  Hematological:  Negative for adenopathy. Does not bruise/bleed easily.  Psychiatric/Behavioral:  Negative for confusion.     PHYSICAL EXAMINATION: ECOG PERFORMANCE STATUS: 2 - Symptomatic, <50% confined to bed Vitals:   09/07/22 0949  BP: 117/64  Pulse: 67  Resp: 18  Temp: (!) 97.5 F (36.4 C)   Filed Weights   09/07/22 0949  Weight: 196 lb 4.8 oz (89 kg)    Physical Exam Constitutional:      General: He is not in acute distress. HENT:     Head: Normocephalic and atraumatic.  Eyes:     General: No scleral icterus. Cardiovascular:     Rate and Rhythm: Normal  rate and regular rhythm.  Pulmonary:     Effort: Pulmonary effort is normal. No respiratory distress.     Breath sounds: No wheezing.  Abdominal:     General: There is no distension.     Palpations: Abdomen is soft.  Musculoskeletal:        General: No deformity. Normal range of motion.     Cervical back: Normal range of motion and neck supple.  Skin:    General: Skin is warm and dry.     Findings: No erythema.  Neurological:     Mental Status: He is alert and oriented to person, place, and time. Mental status is at baseline.     Cranial Nerves: No cranial nerve deficit.  Psychiatric:        Mood and Affect: Mood normal.      LABORATORY DATA:  I have reviewed the data as listed    Latest Ref Rng & Units 09/07/2022   10:14 AM 07/26/2022    2:06 PM 05/16/2022    3:31 PM  CBC  WBC 4.0 - 10.5 K/uL 6.8  6.1  9.1   Hemoglobin 13.0 - 17.0 g/dL 13.0  86.5  78.4   Hematocrit 39.0 - 52.0 % 35.0  34.8  32.9   Platelets 150 - 400 K/uL 211  208  215       Latest Ref Rng & Units 07/26/2022    2:06 PM 05/16/2022    3:31 PM 04/30/2022    6:51 AM  CMP  Glucose 70 - 99 mg/dL 696  295  284   BUN 8 - 23 mg/dL 27  33  41   Creatinine 0.61 - 1.24 mg/dL 1.32  4.40  1.02   Sodium 135 -  145 mmol/L 140  137  139   Potassium 3.5 - 5.1 mmol/L 4.0  4.2  4.9   Chloride 98 - 111 mmol/L 111  107  106   CO2 22 - 32 mmol/L 23  25  26    Calcium 8.9 - 10.3 mg/dL 9.1  9.2  9.5       Component Value Date/Time   FERRITIN 135 02/12/2021 0435     RADIOGRAPHIC STUDIES: I have personally reviewed the radiological images as listed and agreed with the findings in the report. CT CHEST WO CONTRAST  Result Date: 08/20/2022 CLINICAL DATA:  Interstitial lung disease EXAM: CT CHEST WITHOUT CONTRAST TECHNIQUE: Multidetector CT imaging of the chest was performed following the standard protocol without IV contrast. RADIATION DOSE REDUCTION: This exam was performed according to the departmental dose-optimization  program which includes automated exposure control, adjustment of the mA and/or kV according to patient size and/or use of iterative reconstruction technique. COMPARISON:  06/01/2022. FINDINGS: Cardiovascular: No significant vascular findings. Normal heart size. No pericardial effusion. Left-sided pacer in place. Mediastinum/Nodes: No enlarged mediastinal or axillary lymph nodes. Thyroid gland, trachea, and esophagus demonstrate no significant findings. Lungs/Pleura: Right-sided tiny 3 mm nodules minimal linear left upper lobe demonstrates partial resolution. Lungs are otherwise clear scar identified in the. No pneumothorax or pleural effusion. Upper Abdomen: No acute abnormality. Musculoskeletal: There are thoracic degenerative changes. IMPRESSION: Tiny right upper lobe nodules that do not need to be followed with imaging. Diminished atelectasis or scar left upper lobe. No acute cardiopulmonary process. No evidence of interstitial lung disease. Electronically Signed   By: Sammie Bench M.D.   On: 08/20/2022 10:20

## 2022-09-08 LAB — KAPPA/LAMBDA LIGHT CHAINS
Kappa free light chain: 33.2 mg/L — ABNORMAL HIGH (ref 3.3–19.4)
Kappa, lambda light chain ratio: 0.42 (ref 0.26–1.65)
Lambda free light chains: 78.4 mg/L — ABNORMAL HIGH (ref 5.7–26.3)

## 2022-09-10 LAB — MULTIPLE MYELOMA PANEL, SERUM
Albumin SerPl Elph-Mcnc: 3.4 g/dL (ref 2.9–4.4)
Albumin/Glob SerPl: 1.2 (ref 0.7–1.7)
Alpha 1: 0.2 g/dL (ref 0.0–0.4)
Alpha2 Glob SerPl Elph-Mcnc: 0.7 g/dL (ref 0.4–1.0)
B-Globulin SerPl Elph-Mcnc: 0.8 g/dL (ref 0.7–1.3)
Gamma Glob SerPl Elph-Mcnc: 1.2 g/dL (ref 0.4–1.8)
Globulin, Total: 2.9 g/dL (ref 2.2–3.9)
IgA: 43 mg/dL — ABNORMAL LOW (ref 61–437)
IgG (Immunoglobin G), Serum: 566 mg/dL — ABNORMAL LOW (ref 603–1613)
IgM (Immunoglobulin M), Srm: 1289 mg/dL — ABNORMAL HIGH (ref 15–143)
M Protein SerPl Elph-Mcnc: 0.5 g/dL — ABNORMAL HIGH
Total Protein ELP: 6.3 g/dL (ref 6.0–8.5)

## 2022-09-21 ENCOUNTER — Encounter: Payer: Self-pay | Admitting: Intensive Care

## 2022-09-21 ENCOUNTER — Emergency Department
Admission: EM | Admit: 2022-09-21 | Discharge: 2022-09-21 | Disposition: A | Discharge status: Home / Self Care | Payer: Medicare Other | Attending: Emergency Medicine | Admitting: Emergency Medicine

## 2022-09-21 ENCOUNTER — Other Ambulatory Visit: Payer: Self-pay

## 2022-09-21 ENCOUNTER — Emergency Department: Payer: Medicare Other

## 2022-09-21 DIAGNOSIS — R0602 Shortness of breath: Secondary | ICD-10-CM | POA: Insufficient documentation

## 2022-09-21 DIAGNOSIS — R42 Dizziness and giddiness: Secondary | ICD-10-CM | POA: Diagnosis present

## 2022-09-21 DIAGNOSIS — I951 Orthostatic hypotension: Secondary | ICD-10-CM | POA: Insufficient documentation

## 2022-09-21 LAB — TROPONIN I (HIGH SENSITIVITY): Troponin I (High Sensitivity): 6 ng/L (ref <18)

## 2022-09-21 LAB — CBC WITH DIFFERENTIAL/PLATELET
Abs Immature Granulocytes: 0.04 10*3/uL (ref 0.00–0.07)
Basophils Absolute: 0 10*3/uL (ref 0.0–0.1)
Basophils Relative: 1 %
Eosinophils Absolute: 0.4 10*3/uL (ref 0.0–0.5)
Eosinophils Relative: 5 %
HCT: 35.9 % — ABNORMAL LOW (ref 39.0–52.0)
Hemoglobin: 11.7 g/dL — ABNORMAL LOW (ref 13.0–17.0)
Immature Granulocytes: 1 %
Lymphocytes Relative: 34 %
Lymphs Abs: 2.6 10*3/uL (ref 0.7–4.0)
MCH: 31.8 pg (ref 26.0–34.0)
MCHC: 32.6 g/dL (ref 30.0–36.0)
MCV: 97.6 fL (ref 80.0–100.0)
Monocytes Absolute: 0.7 10*3/uL (ref 0.1–1.0)
Monocytes Relative: 9 %
Neutro Abs: 3.8 10*3/uL (ref 1.7–7.7)
Neutrophils Relative %: 50 %
Platelets: 224 10*3/uL (ref 150–400)
RBC: 3.68 MIL/uL — ABNORMAL LOW (ref 4.22–5.81)
RDW: 12.4 % (ref 11.5–15.5)
WBC: 7.5 10*3/uL (ref 4.0–10.5)
nRBC: 0 % (ref 0.0–0.2)

## 2022-09-21 LAB — COMPREHENSIVE METABOLIC PANEL
ALT: 16 U/L (ref 0–44)
AST: 18 U/L (ref 15–41)
Albumin: 3.8 g/dL (ref 3.5–5.0)
Alkaline Phosphatase: 30 U/L — ABNORMAL LOW (ref 38–126)
Anion gap: 8 (ref 5–15)
BUN: 32 mg/dL — ABNORMAL HIGH (ref 8–23)
CO2: 26 mmol/L (ref 22–32)
Calcium: 9.1 mg/dL (ref 8.9–10.3)
Chloride: 105 mmol/L (ref 98–111)
Creatinine, Ser: 1.46 mg/dL — ABNORMAL HIGH (ref 0.61–1.24)
GFR, Estimated: 44 mL/min — ABNORMAL LOW (ref >60)
Glucose, Bld: 92 mg/dL (ref 70–99)
Potassium: 4.3 mmol/L (ref 3.5–5.1)
Sodium: 139 mmol/L (ref 135–145)
Total Bilirubin: 0.6 mg/dL (ref 0.3–1.2)
Total Protein: 7.1 g/dL (ref 6.5–8.1)

## 2022-09-21 LAB — URINALYSIS, ROUTINE W REFLEX MICROSCOPIC
Bilirubin Urine: NEGATIVE
Glucose, UA: NEGATIVE mg/dL
Hgb urine dipstick: NEGATIVE
Ketones, ur: NEGATIVE mg/dL
Leukocytes,Ua: NEGATIVE
Nitrite: NEGATIVE
Protein, ur: NEGATIVE mg/dL
Specific Gravity, Urine: 1.008 (ref 1.005–1.030)
pH: 5 (ref 5.0–8.0)

## 2022-09-21 LAB — TYPE AND SCREEN
ABO/RH(D): A POS
Antibody Screen: NEGATIVE

## 2022-09-21 LAB — LACTIC ACID, PLASMA: Lactic Acid, Venous: 1 mmol/L (ref 0.5–1.9)

## 2022-09-21 LAB — BRAIN NATRIURETIC PEPTIDE: B Natriuretic Peptide: 98.4 pg/mL (ref 0.0–100.0)

## 2022-09-21 MED ORDER — SODIUM CHLORIDE 0.9 % IV BOLUS
500.0000 mL | Freq: Once | INTRAVENOUS | Status: AC
  Administered 2022-09-21: 500 mL via INTRAVENOUS

## 2022-09-21 NOTE — ED Triage Notes (Addendum)
Patient c/o worsening sob today and dizziness. Reports home health nurse came for regular appointment today and got blood pressure 70/50.  Patient is A&O x4 upon arrival.   Takes plavix daily. Also reports memory issues which is why home health has visits with him

## 2022-09-21 NOTE — ED Provider Notes (Signed)
[facility] Provider Note    Event Date/Time   First MD Initiated Contact with Patient 09/21/22 1015     (approximate)   History   Shortness of Breath and Dizziness   HPI  Elijah Collins. is a 89 y.o. male  here with hypotension. Pt reports that nurse came to check on him today and his BP was 99991111 systolic. He felt ok at the time and has otherwise been well, with the exception of some mild lightheadedness, worse with standing, over the past few months. Denies any chest pain, SOB. No abd pain, n/v/d. He has had normal appetite. Does not recall recent med changes.       Physical Exam   Triage Vital Signs: ED Triage Vitals  Enc Vitals Group     BP 09/21/22 0959 (!) 99/54     Pulse Rate 09/21/22 0959 61     Resp 09/21/22 0959 20     Temp 09/21/22 0959 97.7 F (36.5 C)     Temp Source 09/21/22 0959 Oral     SpO2 09/21/22 0959 96 %     Weight 09/21/22 0956 189 lb (85.7 kg)     Height 09/21/22 0956 5' 10"$  (1.778 m)     Head Circumference --      Peak Flow --      Pain Score 09/21/22 0956 0     Pain Loc --      Pain Edu? --      Excl. in [place]? --     Most recent vital signs: Vitals:   09/21/22 1130 09/21/22 1145  BP: 103/66   Pulse:  67  Resp: 17 15  Temp:    SpO2: 100% 99%     General: Awake, no distress.  CV:  Good peripheral perfusion.  Resp:  Normal effort.  Abd:  No distention.  Other:  Well appearing in NAD. CNII-XII intact. No focal neuro deficits.    ED Results / Procedures / Treatments   Labs (all labs ordered are listed, but only abnormal results are displayed) Labs Reviewed  CBC WITH DIFFERENTIAL/PLATELET - Abnormal; Notable for the following components:      Result Value   RBC 3.68 (*)    Hemoglobin 11.7 (*)    HCT 35.9 (*)    All other components within normal limits  COMPREHENSIVE METABOLIC PANEL - Abnormal; Notable for the following components:   BUN 32 (*)    Creatinine, Ser 1.46 (*)    Alkaline Phosphatase  30 (*)    GFR, Estimated 44 (*)    All other components within normal limits  URINALYSIS, ROUTINE W REFLEX MICROSCOPIC - Abnormal; Notable for the following components:   Color, Urine STRAW (*)    APPearance CLEAR (*)    All other components within normal limits  BRAIN NATRIURETIC PEPTIDE  LACTIC ACID, PLASMA  TYPE AND SCREEN  TROPONIN I (HIGH SENSITIVITY)     EKG Paced rhythm, VR 67. No acute st elevations. Similar to prior EKGs on comparison.   RADIOLOGY CXR: Bibasilar subsegmental atelectasis   I also independently reviewed and agree with radiologist interpretations.   PROCEDURES:  Critical Care performed: No   MEDICATIONS ORDERED IN ED: Medications  sodium chloride 0.9 % bolus 500 mL (0 mLs Intravenous Stopped 09/21/22 1154)     IMPRESSION / MDM / ASSESSMENT AND PLAN / ED COURSE  I reviewed the triage vital signs and the nursing notes.  Differential diagnosis includes, but is not limited to, orthostasis, anemia, ACS, AKI, polypharmacy  Patient's presentation is most consistent with acute presentation with potential threat to life or bodily function.  The patient is on the cardiac monitor to evaluate for evidence of arrhythmia and/or significant heart rate changes.  89 year old pleasant male here with asymptomatic hypotension other than mild lightheadedness with standing.  Clinically, the patient is overall very well-appearing.  He appears mildly dehydrated based on his elevated BUN to creatinine compared to baseline.  CBC without leukocytosis.  BNP normal.  Urinalysis without signs of UTI.  Chest x-ray is clear.  He has no focal neurological deficits.  Of note, this correlates with the patient decreasing his p.o. intake of water, as well as starting Imdur.  I suspect this is contributing to his orthostatic hypotension, particularly in the setting of being pacemaker dependent with a base rate in the 60s.  I advised him to hold his Imdur,  resume his normal fluid intake, and call his cardiologist for outpatient follow-up.  Otherwise, no apparent emergent pathology.  FINAL CLINICAL IMPRESSION(S) / ED DIAGNOSES   Final diagnoses:  Orthostasis     Rx / DC Orders   ED Discharge Orders     None        Note:  This document was prepared using Dragon voice recognition software and may include unintentional dictation errors.   Duffy Bruce, MD 09/21/22 [phone]

## 2022-09-21 NOTE — Discharge Instructions (Signed)
For now, I would stop the isosorbide as this may be contributing to your low blood pressure.  Please call your primary care doctor and cardiologist to discuss this change and see if they wanted to call anything else in.   I would recommend going back to your previously recommended fluid intake.

## 2022-11-30 ENCOUNTER — Encounter: Payer: Self-pay | Admitting: Oncology

## 2022-11-30 ENCOUNTER — Inpatient Hospital Stay: Payer: Medicare Other | Attending: Oncology | Admitting: Oncology

## 2022-11-30 ENCOUNTER — Inpatient Hospital Stay: Payer: Medicare Other

## 2022-11-30 VITALS — BP 121/68 | HR 77 | Temp 97.5°F | Resp 18 | Wt 192.7 lb

## 2022-11-30 DIAGNOSIS — N183 Chronic kidney disease, stage 3 unspecified: Secondary | ICD-10-CM | POA: Insufficient documentation

## 2022-11-30 DIAGNOSIS — D539 Nutritional anemia, unspecified: Secondary | ICD-10-CM

## 2022-11-30 DIAGNOSIS — D649 Anemia, unspecified: Secondary | ICD-10-CM | POA: Diagnosis not present

## 2022-11-30 DIAGNOSIS — D472 Monoclonal gammopathy: Secondary | ICD-10-CM | POA: Diagnosis present

## 2022-11-30 LAB — CBC WITH DIFFERENTIAL/PLATELET
Abs Immature Granulocytes: 0.03 10*3/uL (ref 0.00–0.07)
Basophils Absolute: 0 10*3/uL (ref 0.0–0.1)
Basophils Relative: 1 %
Eosinophils Absolute: 0.5 10*3/uL (ref 0.0–0.5)
Eosinophils Relative: 7 %
HCT: 35.7 % — ABNORMAL LOW (ref 39.0–52.0)
Hemoglobin: 11.6 g/dL — ABNORMAL LOW (ref 13.0–17.0)
Immature Granulocytes: 0 %
Lymphocytes Relative: 41 %
Lymphs Abs: 3.1 10*3/uL (ref 0.7–4.0)
MCH: 31.7 pg (ref 26.0–34.0)
MCHC: 32.5 g/dL (ref 30.0–36.0)
MCV: 97.5 fL (ref 80.0–100.0)
Monocytes Absolute: 0.7 10*3/uL (ref 0.1–1.0)
Monocytes Relative: 9 %
Neutro Abs: 3.1 10*3/uL (ref 1.7–7.7)
Neutrophils Relative %: 42 %
Platelets: 232 10*3/uL (ref 150–400)
RBC: 3.66 MIL/uL — ABNORMAL LOW (ref 4.22–5.81)
RDW: 13.2 % (ref 11.5–15.5)
WBC: 7.5 10*3/uL (ref 4.0–10.5)
nRBC: 0 % (ref 0.0–0.2)

## 2022-11-30 LAB — COMPREHENSIVE METABOLIC PANEL
ALT: 15 U/L (ref 0–44)
AST: 22 U/L (ref 15–41)
Albumin: 3.8 g/dL (ref 3.5–5.0)
Alkaline Phosphatase: 28 U/L — ABNORMAL LOW (ref 38–126)
Anion gap: 7 (ref 5–15)
BUN: 25 mg/dL — ABNORMAL HIGH (ref 8–23)
CO2: 24 mmol/L (ref 22–32)
Calcium: 9.2 mg/dL (ref 8.9–10.3)
Chloride: 106 mmol/L (ref 98–111)
Creatinine, Ser: 1.35 mg/dL — ABNORMAL HIGH (ref 0.61–1.24)
GFR, Estimated: 48 mL/min — ABNORMAL LOW (ref >60)
Glucose, Bld: 104 mg/dL — ABNORMAL HIGH (ref 70–99)
Potassium: 4 mmol/L (ref 3.5–5.1)
Sodium: 137 mmol/L (ref 135–145)
Total Bilirubin: 0.5 mg/dL (ref 0.3–1.2)
Total Protein: 7 g/dL (ref 6.5–8.1)

## 2022-11-30 NOTE — Progress Notes (Signed)
Patient here for follow up. No new concerns voiced.  °

## 2022-11-30 NOTE — Progress Notes (Signed)
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 Infrequently, IgM MGUS can progress to IgM multiple myeloma. Discussed with patient that additional testing includes bone marrow biopsy, PET or skeletal survey.  Due to his advance age, share decision was made to hold off additional work up for now.  I recommend observation.  Check UPEP Check multiple myeloma panel, light chain ratio every 6 months  CKD (chronic kidney disease), stage III (HCC) Encourage oral hydration and avoid nephrotoxins.  Mild anemia, observe.   Orders Placed This Encounter  Procedures   CBC with Differential (Cancer Center Only)    Standing Status:   Future    Standing Expiration Date:   11/30/2023   CMP (Cancer Center only)    Standing Status:   Future    Standing Expiration Date:   11/30/2023   Multiple Myeloma Panel (SPEP&IFE w/QIG)    Standing Status:   Future    Standing Expiration Date:   11/30/2023   Kappa/lambda light chains    Standing Status:   Future    Standing Expiration Date:   11/30/2023   Follow up in  6 months.  All questions were answered. The patient knows to call the clinic with any problems, questions or concerns.  Rickard Patience, MD, PhD [facility] Hematology Oncology 11/30/2022     HISTORY OF PRESENTING ILLNESS:  Elijah Lightcap. is a  89 y.o.  male with PMH listed below who was referred to me for  anemia Reviewed patient's recent labs that was done.  He was found to have chronic anemia, and MCV is trending up.  Since his COVID 19 infection , has experienced SOB, fatigue. Was seen by pulmonology recently.   He had not noticed any recent bleeding such as epistaxis, hematuria or hematochezia.  He denies over the counter NSAID ingestion. He is on antiplatelets agent - Aspirin 81mg  daily and Plavix. He eats TV lunches and dinners.   INTERVAL HISTORY Elijah Collins. is a 89 y.o. male who has above history reviewed by me today presents for follow up visit for anemia.  He had work up done and presents to discuss results. No new complains.  MEDICAL HISTORY:  Past Medical History:  Diagnosis Date   Asthma    Cardiac arrhythmia    CHF (congestive heart failure) (HCC)    Hyperlipidemia    Shortness of breath    Stroke (HCC)    TIA (transient ischemic attack)     SURGICAL HISTORY: Past Surgical History:  Procedure Laterality Date   APPENDECTOMY  2011   [facility]   HERNIA REPAIR Right 10 years ago   [facility]  [facility]   PACEMAKER IMPLANT     PACEMAKER INSERTION Left 07/11/2019   Procedure: PACEMAKER CHANGEOUT;  Surgeon: Marcina Millard, MD;  Location: [facility] ORS;  Service: Cardiovascular;  Laterality: Left;    SOCIAL HISTORY: Social History   Socioeconomic History   Marital status: Widowed    Spouse name: Not on file   Number  of children: Not on file   Years of education: Not on file   Highest education level: Not on file  Occupational History   Not on file  Tobacco Use   Smoking status: Never   Smokeless tobacco: Never  Vaping Use   Vaping Use: Never used  Substance and Sexual Activity   Alcohol use: No   Drug use: No   Sexual activity: Not Currently  Other Topics Concern   Not on file  Social History Narrative   Not on file   Social Determinants of Health   Financial Resource Strain: Low Risk  (06/26/2019)   Overall Financial Resource Strain (CARDIA)    Difficulty of  Paying Living Expenses: Not hard at all  Food Insecurity: No Food Insecurity (09/07/2022)   Hunger Vital Sign    Worried About Running Out of Food in the Last Year: Never true    Ran Out of Food in the Last Year: Never true  Transportation Needs: No Transportation Needs (09/07/2022)   PRAPARE - Administrator, Civil Service (Medical): No    Lack of Transportation (Non-Medical): No  Physical Activity: Sufficiently Active (06/26/2019)   Exercise Vital Sign    Days of Exercise per Week: 7 days    Minutes of Exercise per Session: 60 min  Stress: No Stress Concern Present (06/26/2019)   [employer] of Occupational Health - Occupational Stress Questionnaire    Feeling of Stress : Only a little  Social Connections: Moderately Isolated (06/26/2019)   Social Connection and Isolation Panel [NHANES]    Frequency of Communication with Friends and Family: More than three times a week    Frequency of Social Gatherings with Friends and Family: More than three times a week    Attends Religious Services: 1 to 4 times per year    Active Member of [employer] or Organizations: No    Attends Banker Meetings: Never    Marital Status: Never married  Intimate Partner Violence: Not At Risk (09/07/2022)   Humiliation, Afraid, Rape, and Kick questionnaire    Fear of Current or Ex-Partner: No    Emotionally Abused: No    Physically Abused: No    Sexually Abused: No    FAMILY HISTORY: Family History  Problem Relation Age of Onset   CVA Mother    COPD Father     ALLERGIES:  has No Known Allergies.  MEDICATIONS:  Current Outpatient Medications  Medication Sig Dispense Refill   aspirin 81 MG chewable tablet Chew 81 mg by mouth at bedtime.     clopidogrel (PLAVIX) 75 MG tablet Take 75 mg by mouth daily.     guaiFENesin-dextromethorphan (ROBITUSSIN DM) 100-10 MG/5ML syrup Take 10 mLs by mouth every 4 (four) hours as needed for cough. 118 mL 0   Multiple Vitamin (MULTI-VITAMINS) TABS  Take 1 tablet by mouth daily.     nitroGLYCERIN (NITROSTAT) 0.4 MG SL tablet Place under the tongue. Place 1 tablet (0.4 mg total) under the tongue every 5 (five) minutes as needed for Chest pain May take up to 3 doses.     torsemide (DEMADEX) 20 MG tablet Take 20 mg by mouth daily.     traZODone (DESYREL) 50 MG tablet Take 1 tablet by mouth at bedtime.     vitamin B-12 (CYANOCOBALAMIN) 500 MCG tablet Take 500 mcg by mouth daily.     No current facility-administered medications for this visit.    Review of Systems  Constitutional:  Positive for fatigue. Negative  for appetite change, chills, fever and unexpected weight change.  HENT:   Negative for hearing loss and voice change.   Eyes:  Negative for eye problems and icterus.  Respiratory:  Positive for shortness of breath. Negative for chest tightness and cough.   Cardiovascular:  Negative for chest pain and leg swelling.  Gastrointestinal:  Negative for abdominal distention and abdominal pain.  Endocrine: Negative for hot flashes.  Genitourinary:  Negative for difficulty urinating, dysuria and frequency.   Musculoskeletal:  Negative for arthralgias.  Skin:  Negative for itching and rash.  Neurological:  Negative for light-headedness and numbness.  Hematological:  Negative for adenopathy. Does not bruise/bleed easily.  Psychiatric/Behavioral:  Negative for confusion.     PHYSICAL EXAMINATION: ECOG PERFORMANCE STATUS: 2 - Symptomatic, <50% confined to bed Vitals:   11/30/22 0957  BP: 121/68  Pulse: 77  Resp: 18  Temp: (!) 97.5 F (36.4 C)   Filed Weights   11/30/22 0957  Weight: 192 lb 11.2 oz (87.4 kg)    Physical Exam Constitutional:      General: He is not in acute distress. HENT:     Head: Normocephalic and atraumatic.  Eyes:     General: No scleral icterus. Cardiovascular:     Rate and Rhythm: Normal rate and regular rhythm.  Pulmonary:     Effort: Pulmonary effort is normal. No respiratory distress.     Breath  sounds: No wheezing.  Abdominal:     General: There is no distension.     Palpations: Abdomen is soft.  Musculoskeletal:        General: No deformity. Normal range of motion.     Cervical back: Normal range of motion and neck supple.  Skin:    General: Skin is warm and dry.     Findings: No erythema.  Neurological:     Mental Status: He is alert and oriented to person, place, and time. Mental status is at baseline.     Cranial Nerves: No cranial nerve deficit.  Psychiatric:        Mood and Affect: Mood normal.      LABORATORY DATA:  I have reviewed the data as listed    Latest Ref Rng & Units 11/30/2022    9:39 AM 09/21/2022   10:08 AM 09/07/2022   10:14 AM  CBC  WBC 4.0 - 10.5 K/uL 7.5  7.5  6.8   Hemoglobin 13.0 - 17.0 g/dL 16.1  09.6  04.5   Hematocrit 39.0 - 52.0 % 35.7  35.9  35.0   Platelets 150 - 400 K/uL 232  224  211       Latest Ref Rng & Units 11/30/2022    9:39 AM 09/21/2022   10:08 AM 09/07/2022   10:14 AM  CMP  Glucose 70 - 99 mg/dL 409  92    BUN 8 - 23 mg/dL 25  32    Creatinine 8.11 - 1.24 mg/dL 9.14  7.82    Sodium 956 - 145 mmol/L 137  139    Potassium 3.5 - 5.1 mmol/L 4.0  4.3    Chloride 98 - 111 mmol/L 106  105    CO2 22 - 32 mmol/L 24  26    Calcium 8.9 - 10.3 mg/dL 9.2  9.1    Total Protein 6.5 - 8.1 g/dL 7.0  7.1  7.3   Total Bilirubin 0.3 - 1.2 mg/dL 0.5  0.6  0.3   Alkaline Phos 38 - 126 U/L 28  30  27   AST 15 - 41 U/L 22  18  20    ALT 0 - 44 U/L 15  16  16        Component Value Date/Time   IRON 94 09/07/2022 1014   TIBC 399 09/07/2022 1014   FERRITIN 68 09/07/2022 1014   IRONPCTSAT 24 09/07/2022 1014     RADIOGRAPHIC STUDIES: I have personally reviewed the radiological images as listed and agreed with the findings in the report. No results found.

## 2022-11-30 NOTE — Assessment & Plan Note (Signed)
IgM Lamda MGUS I discussed with patient about the diagnosis of IgM MGUS which is an asymptomatic condition which has a small risk of progression to smoldering Waldenstrm macroglobulinemia and to symptomatic [place] macroglobulinemia, and less often to lymphoma or AL amyloidosis. Infrequently, IgM MGUS can progress to IgM multiple myeloma. Discussed with patient that additional testing includes bone marrow biopsy, PET or skeletal survey.  Due to his advance age, share decision was made to hold off additional work up for now.  I recommend observation.  Check UPEP Check multiple myeloma panel, light chain ratio every 6 months

## 2022-11-30 NOTE — Assessment & Plan Note (Signed)
Encourage oral hydration and avoid nephrotoxins.  Mild anemia, observe.

## 2022-12-03 ENCOUNTER — Other Ambulatory Visit: Payer: Self-pay | Admitting: Orthopedic Surgery

## 2022-12-03 DIAGNOSIS — M4807 Spinal stenosis, lumbosacral region: Secondary | ICD-10-CM

## 2022-12-04 ENCOUNTER — Other Ambulatory Visit: Payer: Self-pay

## 2022-12-04 ENCOUNTER — Inpatient Hospital Stay: Payer: Medicare Other | Attending: Oncology

## 2022-12-04 DIAGNOSIS — D472 Monoclonal gammopathy: Secondary | ICD-10-CM | POA: Insufficient documentation

## 2022-12-04 DIAGNOSIS — N183 Chronic kidney disease, stage 3 unspecified: Secondary | ICD-10-CM | POA: Insufficient documentation

## 2022-12-09 LAB — IFE+PROTEIN ELECTRO, 24-HR UR
% BETA, Urine: 38.4 %
ALPHA 1 URINE: 0.6 %
Albumin, U: 18.2 %
Alpha 2, Urine: 13.9 %
GAMMA GLOBULIN URINE: 28.9 %
Total Protein, Urine-Ur/day: 151 mg/24 hr — ABNORMAL HIGH (ref 30–150)
Total Protein, Urine: 17.8 mg/dL
Total Volume: 850

## 2022-12-17 ENCOUNTER — Ambulatory Visit: Payer: Medicare Other

## 2023-02-21 ENCOUNTER — Emergency Department
Admission: EM | Admit: 2023-02-21 | Discharge: 2023-02-21 | Disposition: A | Discharge status: Home / Self Care | Payer: Medicare Other | Source: Home / Self Care | Attending: Emergency Medicine | Admitting: Emergency Medicine

## 2023-02-21 ENCOUNTER — Emergency Department: Payer: Medicare Other

## 2023-02-21 ENCOUNTER — Other Ambulatory Visit: Payer: Self-pay

## 2023-02-21 DIAGNOSIS — N189 Chronic kidney disease, unspecified: Secondary | ICD-10-CM | POA: Insufficient documentation

## 2023-02-21 DIAGNOSIS — R410 Disorientation, unspecified: Secondary | ICD-10-CM | POA: Diagnosis present

## 2023-02-21 DIAGNOSIS — Z8673 Personal history of transient ischemic attack (TIA), and cerebral infarction without residual deficits: Secondary | ICD-10-CM | POA: Insufficient documentation

## 2023-02-21 LAB — COMPREHENSIVE METABOLIC PANEL
ALT: 14 U/L (ref 0–44)
AST: 17 U/L (ref 15–41)
Albumin: 4 g/dL (ref 3.5–5.0)
Alkaline Phosphatase: 31 U/L — ABNORMAL LOW (ref 38–126)
Anion gap: 10 (ref 5–15)
BUN: 22 mg/dL (ref 8–23)
CO2: 21 mmol/L — ABNORMAL LOW (ref 22–32)
Calcium: 9.6 mg/dL (ref 8.9–10.3)
Chloride: 106 mmol/L (ref 98–111)
Creatinine, Ser: 1.26 mg/dL — ABNORMAL HIGH (ref 0.61–1.24)
GFR, Estimated: 53 mL/min — ABNORMAL LOW (ref >60)
Glucose, Bld: 107 mg/dL — ABNORMAL HIGH (ref 70–99)
Potassium: 4.2 mmol/L (ref 3.5–5.1)
Sodium: 137 mmol/L (ref 135–145)
Total Bilirubin: 0.5 mg/dL (ref 0.3–1.2)
Total Protein: 7.4 g/dL (ref 6.5–8.1)

## 2023-02-21 LAB — CBC
HCT: 37.1 % — ABNORMAL LOW (ref 39.0–52.0)
Hemoglobin: 12.6 g/dL — ABNORMAL LOW (ref 13.0–17.0)
MCH: 32.7 pg (ref 26.0–34.0)
MCHC: 34 g/dL (ref 30.0–36.0)
MCV: 96.4 fL (ref 80.0–100.0)
Platelets: 197 10*3/uL (ref 150–400)
RBC: 3.85 MIL/uL — ABNORMAL LOW (ref 4.22–5.81)
RDW: 12.6 % (ref 11.5–15.5)
WBC: 8.5 10*3/uL (ref 4.0–10.5)
nRBC: 0 % (ref 0.0–0.2)

## 2023-02-21 LAB — CBG MONITORING, ED: Glucose-Capillary: 100 mg/dL — ABNORMAL HIGH (ref 70–99)

## 2023-02-21 NOTE — ED Triage Notes (Signed)
First Nurse Note:  BIB [facility] from home. Complaint of confusion. Hx of TIA with last one 3 years ago. Stroke screen negative with EMS. Pt reports he woke, thought it was time to go to church, got dressed and went and no one was there. EMS reports that pt has been appropriately oriented with them since their arrival and following all commands. Denies chest pain or SOB. Denies h/a or dizziness.   EMS VS:  145/93 HR 97 97% RA RR 18

## 2023-02-21 NOTE — ED Notes (Signed)
Called neighbor per patient request [phone] for a ride home. No answer. Left a message

## 2023-02-21 NOTE — ED Provider Notes (Signed)
[facility] Provider Note    Event Date/Time   First MD Initiated Contact with Patient 02/21/23 0204     (approximate)   History   Chief Complaint: Altered Mental Status   HPI  Elijah Collins. is a 89 y.o. male with a history of CKD, TIA, GERD who comes to the ED complaining of confusion.  Patient reports that he woke up tonight and thought it was time for church so he put on his close and drove to church.  There was nobody there, so he waited around for 10 or 15 minutes and when nobody came he decided to go home.  He was able to navigate the streets just fine.  Upon returning home he slept for a few hours and then woke up and decided to come to the ED for evaluation.  He lives alone, manages his own affairs.  He does have a aide who comes once a week to help him around the house.     Physical Exam   Triage Vital Signs: ED Triage Vitals  Encounter Vitals Group     BP 02/21/23 0050 107/81     Systolic BP Percentile --      Diastolic BP Percentile --      Pulse Rate 02/21/23 0050 65     Resp 02/21/23 0050 16     Temp 02/21/23 0050 98 F (36.7 C)     Temp Source 02/21/23 0050 Oral     SpO2 02/21/23 0050 98 %     Weight 02/21/23 0047 191 lb 12.8 oz (87 kg)     Height 02/21/23 0047 5\' 10"  (1.778 m)     Head Circumference --      Peak Flow --      Pain Score 02/21/23 0047 0     Pain Loc --      Pain Education --      Exclude from Growth Chart --     Most recent vital signs: Vitals:   02/21/23 0050  BP: 107/81  Pulse: 65  Resp: 16  Temp: 98 F (36.7 C)  SpO2: 98%    General: Awake, no distress.  CV:  Good peripheral perfusion.  Resp:  Normal effort.  Abd:  No distention.  Other:  Cranial nerves III through XII intact, no drift, normal cerebellar function, normal speech and language.  NIH stroke scale 0   ED Results / Procedures / Treatments   Labs (all labs ordered are listed, but only abnormal results are displayed) Labs Reviewed   COMPREHENSIVE METABOLIC PANEL - Abnormal; Notable for the following components:      Result Value   CO2 21 (*)    Glucose, Bld 107 (*)    Creatinine, Ser 1.26 (*)    Alkaline Phosphatase 31 (*)    GFR, Estimated 53 (*)    All other components within normal limits  CBC - Abnormal; Notable for the following components:   RBC 3.85 (*)    Hemoglobin 12.6 (*)    HCT 37.1 (*)    All other components within normal limits  CBG MONITORING, ED - Abnormal; Notable for the following components:   Glucose-Capillary 100 (*)    All other components within normal limits     EKG Interpreted by me Atrial paced rhythm, rate of 63.  Normal axis, normal intervals.  Normal QRS ST segments and T waves.   RADIOLOGY CT head interpreted by me, negative for mass or intracranial hemorrhage.  Radiology  report reviewed   PROCEDURES:  Procedures   MEDICATIONS ORDERED IN ED: Medications - No data to display   IMPRESSION / MDM / ASSESSMENT AND PLAN / ED COURSE  I reviewed the triage vital signs and the nursing notes.  DDx: Electrolyte abnormality, anemia, transient disorientation related to sleep, intracranial mass  Patient's presentation is most consistent with acute presentation with potential threat to life or bodily function.  Patient presents with a transient episode of confusion, now completely lucid, stroke scale is 0.  No identifiable neurodeficits or symptoms currently.  Workup reassuring, stable for discharge to follow-up with his PCP.  He notes that he does not take his medicines reliably, but is agreeable to improving this, including asking his aide to organize his medicines in his pillbox.       FINAL CLINICAL IMPRESSION(S) / ED DIAGNOSES   Final diagnoses:  Transient confusion     Rx / DC Orders   ED Discharge Orders     None        Note:  This document was prepared using Dragon voice recognition software and may include unintentional dictation errors.   Sharman Cheek, MD 02/21/23 [phone]

## 2023-02-21 NOTE — Discharge Instructions (Signed)
Your Ct scan of the head and lab tests today were all okay. Be sure to take all of your medications as prescribed, and follow up with your doctor.

## 2023-05-24 ENCOUNTER — Inpatient Hospital Stay: Payer: Medicare Other | Attending: Oncology

## 2023-05-24 DIAGNOSIS — D472 Monoclonal gammopathy: Secondary | ICD-10-CM | POA: Diagnosis present

## 2023-05-24 DIAGNOSIS — N189 Chronic kidney disease, unspecified: Secondary | ICD-10-CM | POA: Diagnosis not present

## 2023-05-24 DIAGNOSIS — Z7982 Long term (current) use of aspirin: Secondary | ICD-10-CM | POA: Diagnosis not present

## 2023-05-24 DIAGNOSIS — D631 Anemia in chronic kidney disease: Secondary | ICD-10-CM | POA: Diagnosis not present

## 2023-05-24 DIAGNOSIS — Z7902 Long term (current) use of antithrombotics/antiplatelets: Secondary | ICD-10-CM | POA: Diagnosis not present

## 2023-05-24 DIAGNOSIS — D539 Nutritional anemia, unspecified: Secondary | ICD-10-CM

## 2023-05-24 LAB — CBC WITH DIFFERENTIAL (CANCER CENTER ONLY)
Abs Immature Granulocytes: 0.04 10*3/uL (ref 0.00–0.07)
Basophils Absolute: 0 10*3/uL (ref 0.0–0.1)
Basophils Relative: 1 %
Eosinophils Absolute: 0.3 10*3/uL (ref 0.0–0.5)
Eosinophils Relative: 3 %
HCT: 33.6 % — ABNORMAL LOW (ref 39.0–52.0)
Hemoglobin: 11.2 g/dL — ABNORMAL LOW (ref 13.0–17.0)
Immature Granulocytes: 1 %
Lymphocytes Relative: 42 %
Lymphs Abs: 3.5 10*3/uL (ref 0.7–4.0)
MCH: 33 pg (ref 26.0–34.0)
MCHC: 33.3 g/dL (ref 30.0–36.0)
MCV: 99.1 fL (ref 80.0–100.0)
Monocytes Absolute: 0.7 10*3/uL (ref 0.1–1.0)
Monocytes Relative: 8 %
Neutro Abs: 3.9 10*3/uL (ref 1.7–7.7)
Neutrophils Relative %: 45 %
Platelet Count: 211 10*3/uL (ref 150–400)
RBC: 3.39 MIL/uL — ABNORMAL LOW (ref 4.22–5.81)
RDW: 13.3 % (ref 11.5–15.5)
WBC Count: 8.3 10*3/uL (ref 4.0–10.5)
nRBC: 0 % (ref 0.0–0.2)

## 2023-05-24 LAB — CMP (CANCER CENTER ONLY)
ALT: 17 U/L (ref 0–44)
AST: 19 U/L (ref 15–41)
Albumin: 3.8 g/dL (ref 3.5–5.0)
Alkaline Phosphatase: 27 U/L — ABNORMAL LOW (ref 38–126)
Anion gap: 9 (ref 5–15)
BUN: 35 mg/dL — ABNORMAL HIGH (ref 8–23)
CO2: 23 mmol/L (ref 22–32)
Calcium: 9 mg/dL (ref 8.9–10.3)
Chloride: 107 mmol/L (ref 98–111)
Creatinine: 1.06 mg/dL (ref 0.61–1.24)
GFR, Estimated: >60 mL/min (ref >60)
Glucose, Bld: 95 mg/dL (ref 70–99)
Potassium: 3.9 mmol/L (ref 3.5–5.1)
Sodium: 139 mmol/L (ref 135–145)
Total Bilirubin: 0.4 mg/dL (ref 0.3–1.2)
Total Protein: 6.9 g/dL (ref 6.5–8.1)

## 2023-05-25 LAB — KAPPA/LAMBDA LIGHT CHAINS
Kappa free light chain: 30.2 mg/L — ABNORMAL HIGH (ref 3.3–19.4)
Kappa, lambda light chain ratio: 0.43 (ref 0.26–1.65)
Lambda free light chains: 70.9 mg/L — ABNORMAL HIGH (ref 5.7–26.3)

## 2023-05-27 LAB — MULTIPLE MYELOMA PANEL, SERUM
Albumin SerPl Elph-Mcnc: 3.4 g/dL (ref 2.9–4.4)
Albumin/Glob SerPl: 1.1 (ref 0.7–1.7)
Alpha 1: 0.2 g/dL (ref 0.0–0.4)
Alpha2 Glob SerPl Elph-Mcnc: 0.7 g/dL (ref 0.4–1.0)
B-Globulin SerPl Elph-Mcnc: 0.9 g/dL (ref 0.7–1.3)
Gamma Glob SerPl Elph-Mcnc: 1.3 g/dL (ref 0.4–1.8)
Globulin, Total: 3.1 g/dL (ref 2.2–3.9)
IgA: 53 mg/dL — ABNORMAL LOW (ref 61–437)
IgG (Immunoglobin G), Serum: 596 mg/dL — ABNORMAL LOW (ref 603–1613)
IgM (Immunoglobulin M), Srm: 1154 mg/dL — ABNORMAL HIGH (ref 15–143)
M Protein SerPl Elph-Mcnc: 0.8 g/dL — ABNORMAL HIGH
Total Protein ELP: 6.5 g/dL (ref 6.0–8.5)

## 2023-06-03 ENCOUNTER — Encounter: Payer: Self-pay | Admitting: Oncology

## 2023-06-03 ENCOUNTER — Inpatient Hospital Stay: Payer: Medicare Other | Admitting: Oncology

## 2023-06-03 VITALS — BP 141/92 | HR 90 | Temp 96.8°F | Resp 18 | Wt 182.6 lb

## 2023-06-03 DIAGNOSIS — D631 Anemia in chronic kidney disease: Secondary | ICD-10-CM

## 2023-06-03 DIAGNOSIS — N1831 Chronic kidney disease, stage 3a: Secondary | ICD-10-CM | POA: Diagnosis not present

## 2023-06-03 DIAGNOSIS — D472 Monoclonal gammopathy: Secondary | ICD-10-CM

## 2023-06-03 NOTE — Assessment & Plan Note (Signed)
Slightly decreased hemoglobin. Monitor.

## 2023-06-03 NOTE — Assessment & Plan Note (Addendum)
IgM Lamda MGUS I discussed with patient about the diagnosis of IgM MGUS which is an asymptomatic condition which has a small risk of progression to smoldering Waldenstrm macroglobulinemia and to symptomatic Wal[place]strm macroglobulinemia, and less often to lymphoma or AL amyloidosis. Infrequently, IgM MGUS can progress to IgM multiple myeloma. Discussed with patient that additional testing includes bone marrow biopsy, PET or skeletal survey.  Due to his advance age, share decision was made to hold off additional work up for now.  Lab Results  Component Value Date   MPROTEIN 0.8 (H) 05/24/2023   KPAFRELGTCHN 30.2 (H) 05/24/2023   LAMBDASER 70.9 (H) 05/24/2023   KAPLAMBRATIO 0.43 05/24/2023    Recommend observation.

## 2023-06-03 NOTE — Progress Notes (Signed)
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 Infrequently, IgM MGUS can progress to IgM multiple myeloma. Discussed with patient that additional testing includes bone marrow biopsy, PET or skeletal survey.  Due to his advance age, share decision was made to hold off additional work up for now.  Lab Results  Component Value Date   MPROTEIN 0.8 (H) 05/24/2023   KPAFRELGTCHN 30.2 (H) 05/24/2023   LAMBDASER 70.9 (H) 05/24/2023   KAPLAMBRATIO 0.43 05/24/2023    Recommend observation.   Anemia in chronic kidney disease (CKD) Slightly decreased hemoglobin. Monitor.   Orders Placed This Encounter  Procedures   CBC with Differential (Cancer Center Only)    Standing Status:   Future    Standing Expiration Date:   06/02/2024   CMP (Cancer Center only)    Standing Status:   Future    Standing Expiration Date:   06/02/2024   Multiple Myeloma Panel (SPEP&IFE w/QIG)    Standing Status:   Future    Standing Expiration Date:   06/02/2024   Kappa/lambda light chains    Standing Status:   Future    Standing Expiration Date:   06/02/2024   Miscellaneous [facility] test (send-out)    Standing Status:   Future    Standing Expiration Date:   06/02/2024    Order Specific Question:   Test name / description:    Answer:   NT-proBNP [facility] [id number]   Follow up in  6 months.  All questions were answered. The patient knows to call the  clinic with any problems, questions or concerns.  Rickard Patience, MD, PhD [facility] Hematology Oncology 06/03/2023     HISTORY OF PRESENTING ILLNESS:  Elijah Collins. is a  89 y.o.  male with PMH listed below who was referred to me for anemia Reviewed patient's recent labs that was done.  He was found to have chronic anemia, and MCV is trending up.  Since his COVID 19 infection , has experienced SOB, fatigue. Was seen by pulmonology recently.   He had not noticed any recent bleeding such as epistaxis, hematuria or hematochezia.  He denies over the counter NSAID ingestion. He is on antiplatelets agent - Aspirin 81mg  daily and Plavix. He eats TV lunches and dinners.   INTERVAL HISTORY Jonathen Callum. is a 89 y.o. male who has above history reviewed by me today presents for follow up visit for MGUS He reports feeling well. No new concerns   MEDICAL HISTORY:  Past Medical History:  Diagnosis Date   Asthma    Cardiac arrhythmia    CHF (congestive heart failure) (HCC)    Hyperlipidemia    Shortness of breath    Stroke (HCC)    TIA (transient ischemic attack)     SURGICAL HISTORY: Past Surgical History:  Procedure Laterality Date   APPENDECTOMY  2011   [facility]   HERNIA REPAIR Right 10 years ago   [facility]  [facility]   PACEMAKER IMPLANT     PACEMAKER  INSERTION Left 07/11/2019   Procedure: PACEMAKER CHANGEOUT;  Surgeon: Marcina Millard, MD;  Location: [facility] ORS;  Service: Cardiovascular;  Laterality: Left;    SOCIAL HISTORY: Social History   Socioeconomic History   Marital status: Widowed    Spouse name: Not on file   Number of children: Not on file   Years of education: Not on file   Highest education level: Not on file  Occupational History   Not on file  Tobacco Use   Smoking status: Never   Smokeless tobacco: Never  Vaping Use   Vaping status: Never Used  Substance and Sexual Activity   Alcohol use: No   Drug use: No   Sexual activity: Not Currently  Other Topics  Concern   Not on file  Social History Narrative   Not on file   Social Determinants of Health   Financial Resource Strain: Low Risk  (05/10/2023)   Received from [facility]   Overall Financial Resource Strain (CARDIA)    Difficulty of Paying Living Expenses: Not hard at all  Food Insecurity: No Food Insecurity (05/10/2023)   Received from [facility]   Hunger Vital Sign    Worried About Running Out of Food in the Last Year: Never true    Ran Out of Food in the Last Year: Never true  Transportation Needs: No Transportation Needs (05/10/2023)   Received from [facility] - Transportation    In the past 12 months, has lack of transportation kept you from medical appointments or from getting medications?: No    Lack of Transportation (Non-Medical): No  Physical Activity: Sufficiently Active (06/26/2019)   Exercise Vital Sign    Days of Exercise per Week: 7 days    Minutes of Exercise per Session: 60 min  Stress: No Stress Concern Present (06/26/2019)   [employer] of Occupational Health - Occupational Stress Questionnaire    Feeling of Stress : Only a little  Social Connections: Moderately Isolated (06/26/2019)   Social Connection and Isolation Panel [NHANES]    Frequency of Communication with Friends and Family: More than three times a week    Frequency of Social Gatherings with Friends and Family: More than three times a week    Attends Religious Services: 1 to 4 times per year    Active Member of [employer] or Organizations: No    Attends Banker Meetings: Never    Marital Status: Never married  Intimate Partner Violence: Not At Risk (09/07/2022)   Humiliation, Afraid, Rape, and Kick questionnaire    Fear of Current or Ex-Partner: No    Emotionally Abused: No    Physically Abused: No    Sexually Abused: No    FAMILY HISTORY: Family History  Problem Relation Age of Onset   CVA Mother    COPD Father      ALLERGIES:  has No Known Allergies.  MEDICATIONS:  Current Outpatient Medications  Medication Sig Dispense Refill   aspirin 81 MG chewable tablet Chew 81 mg by mouth at bedtime.     Multiple Vitamin (MULTI-VITAMINS) TABS Take 1 tablet by mouth daily.     nitroGLYCERIN (NITROSTAT) 0.4 MG SL tablet Place under the tongue. Place 1 tablet (0.4 mg total) under the tongue every 5 (five) minutes as needed for Chest pain May take up to 3 doses.     albuterol (VENTOLIN HFA) 108 (90 Base) MCG/ACT inhaler Inhale into the lungs. (Patient not taking: Reported on 06/03/2023)  clopidogrel (PLAVIX) 75 MG tablet Take 75 mg by mouth daily. (Patient not taking: Reported on 06/03/2023)     guaiFENesin-dextromethorphan (ROBITUSSIN DM) 100-10 MG/5ML syrup Take 10 mLs by mouth every 4 (four) hours as needed for cough. (Patient not taking: Reported on 06/03/2023) 118 mL 0   montelukast (SINGULAIR) 10 MG tablet Take 1 tablet by mouth at bedtime. (Patient not taking: Reported on 06/03/2023)     torsemide (DEMADEX) 20 MG tablet Take 20 mg by mouth daily. (Patient not taking: Reported on 06/03/2023)     traZODone (DESYREL) 50 MG tablet Take 1 tablet by mouth at bedtime. (Patient not taking: Reported on 06/03/2023)     TRELEGY ELLIPTA 100-62.5-25 MCG/ACT AEPB Inhale into the lungs. (Patient not taking: Reported on 06/03/2023)     vitamin B-12 (CYANOCOBALAMIN) 500 MCG tablet Take 500 mcg by mouth daily. (Patient not taking: Reported on 06/03/2023)     No current facility-administered medications for this visit.    Review of Systems  Constitutional:  Positive for fatigue. Negative for appetite change, chills, fever and unexpected weight change.  HENT:   Negative for hearing loss and voice change.   Eyes:  Negative for eye problems and icterus.  Respiratory:  Positive for shortness of breath. Negative for chest tightness and cough.   Cardiovascular:  Negative for chest pain and leg swelling.  Gastrointestinal:   Negative for abdominal distention and abdominal pain.  Endocrine: Negative for hot flashes.  Genitourinary:  Negative for difficulty urinating, dysuria and frequency.   Musculoskeletal:  Negative for arthralgias.  Skin:  Negative for itching and rash.  Neurological:  Negative for light-headedness and numbness.  Hematological:  Negative for adenopathy. Does not bruise/bleed easily.  Psychiatric/Behavioral:  Negative for confusion.     PHYSICAL EXAMINATION: ECOG PERFORMANCE STATUS: 2 - Symptomatic, <50% confined to bed Vitals:   06/03/23 1001  BP: (!) 141/92  Pulse: 90  Resp: 18  Temp: (!) 96.8 F (36 C)  SpO2: 99%   Filed Weights   06/03/23 1001  Weight: 182 lb 9.6 oz (82.8 kg)    Physical Exam Constitutional:      General: He is not in acute distress. HENT:     Head: Normocephalic and atraumatic.  Eyes:     General: No scleral icterus. Cardiovascular:     Rate and Rhythm: Normal rate and regular rhythm.  Pulmonary:     Effort: Pulmonary effort is normal. No respiratory distress.     Breath sounds: No wheezing.  Abdominal:     General: There is no distension.     Palpations: Abdomen is soft.  Musculoskeletal:        General: No deformity. Normal range of motion.     Cervical back: Normal range of motion and neck supple.  Skin:    General: Skin is warm and dry.     Findings: No erythema.  Neurological:     Mental Status: He is alert and oriented to person, place, and time. Mental status is at baseline.     Cranial Nerves: No cranial nerve deficit.  Psychiatric:        Mood and Affect: Mood normal.      LABORATORY DATA:  I have reviewed the data as listed    Latest Ref Rng & Units 05/24/2023    8:24 AM 02/21/2023   12:51 AM 11/30/2022    9:39 AM  CBC  WBC 4.0 - 10.5 K/uL 8.3  8.5  7.5   Hemoglobin 13.0 - 17.0 g/dL  11.2  12.6  11.6   Hematocrit 39.0 - 52.0 % 33.6  37.1  35.7   Platelets 150 - 400 K/uL 211  197  232       Latest Ref Rng & Units  05/24/2023    8:24 AM 02/21/2023   12:51 AM 11/30/2022    9:39 AM  CMP  Glucose 70 - 99 mg/dL 95  469  629   BUN 8 - 23 mg/dL 35  22  25   Creatinine 0.61 - 1.24 mg/dL 5.28  4.13  2.44   Sodium 135 - 145 mmol/L 139  137  137   Potassium 3.5 - 5.1 mmol/L 3.9  4.2  4.0   Chloride 98 - 111 mmol/L 107  106  106   CO2 22 - 32 mmol/L 23  21  24    Calcium 8.9 - 10.3 mg/dL 9.0  9.6  9.2   Total Protein 6.5 - 8.1 g/dL 6.9  7.4  7.0   Total Bilirubin 0.3 - 1.2 mg/dL 0.4  0.5  0.5   Alkaline Phos 38 - 126 U/L 27  31  28    AST 15 - 41 U/L 19  17  22    ALT 0 - 44 U/L 17  14  15        Component Value Date/Time   IRON 94 09/07/2022 1014   TIBC 399 09/07/2022 1014   FERRITIN 68 09/07/2022 1014   IRONPCTSAT 24 09/07/2022 1014     RADIOGRAPHIC STUDIES: I have personally reviewed the radiological images as listed and agreed with the findings in the report. No results found.

## 2023-06-26 ENCOUNTER — Other Ambulatory Visit: Payer: Self-pay

## 2023-06-26 ENCOUNTER — Emergency Department
Admission: EM | Admit: 2023-06-26 | Discharge: 2023-06-26 | Disposition: A | Discharge status: Home / Self Care | Payer: Medicare Other | Attending: Emergency Medicine | Admitting: Emergency Medicine

## 2023-06-26 ENCOUNTER — Emergency Department: Payer: Medicare Other

## 2023-06-26 DIAGNOSIS — Z950 Presence of cardiac pacemaker: Secondary | ICD-10-CM | POA: Diagnosis not present

## 2023-06-26 DIAGNOSIS — R410 Disorientation, unspecified: Secondary | ICD-10-CM | POA: Insufficient documentation

## 2023-06-26 DIAGNOSIS — Z7951 Long term (current) use of inhaled steroids: Secondary | ICD-10-CM | POA: Insufficient documentation

## 2023-06-26 DIAGNOSIS — I509 Heart failure, unspecified: Secondary | ICD-10-CM | POA: Diagnosis not present

## 2023-06-26 DIAGNOSIS — J45909 Unspecified asthma, uncomplicated: Secondary | ICD-10-CM | POA: Insufficient documentation

## 2023-06-26 DIAGNOSIS — Z7982 Long term (current) use of aspirin: Secondary | ICD-10-CM | POA: Insufficient documentation

## 2023-06-26 LAB — CBC WITH DIFFERENTIAL/PLATELET
Abs Immature Granulocytes: 0.03 10*3/uL (ref 0.00–0.07)
Basophils Absolute: 0 10*3/uL (ref 0.0–0.1)
Basophils Relative: 1 %
Eosinophils Absolute: 0.4 10*3/uL (ref 0.0–0.5)
Eosinophils Relative: 7 %
HCT: 35.4 % — ABNORMAL LOW (ref 39.0–52.0)
Hemoglobin: 11.6 g/dL — ABNORMAL LOW (ref 13.0–17.0)
Immature Granulocytes: 1 %
Lymphocytes Relative: 40 %
Lymphs Abs: 2.8 10*3/uL (ref 0.7–4.0)
MCH: 32.5 pg (ref 26.0–34.0)
MCHC: 32.8 g/dL (ref 30.0–36.0)
MCV: 99.2 fL (ref 80.0–100.0)
Monocytes Absolute: 0.6 10*3/uL (ref 0.1–1.0)
Monocytes Relative: 9 %
Neutro Abs: 2.8 10*3/uL (ref 1.7–7.7)
Neutrophils Relative %: 42 %
Platelets: 183 10*3/uL (ref 150–400)
RBC: 3.57 MIL/uL — ABNORMAL LOW (ref 4.22–5.81)
RDW: 12.9 % (ref 11.5–15.5)
WBC: 6.6 10*3/uL (ref 4.0–10.5)
nRBC: 0 % (ref 0.0–0.2)

## 2023-06-26 LAB — URINALYSIS, ROUTINE W REFLEX MICROSCOPIC
Bilirubin Urine: NEGATIVE
Glucose, UA: NEGATIVE mg/dL
Hgb urine dipstick: NEGATIVE
Ketones, ur: NEGATIVE mg/dL
Leukocytes,Ua: NEGATIVE
Nitrite: NEGATIVE
Protein, ur: NEGATIVE mg/dL
Specific Gravity, Urine: 1.013 (ref 1.005–1.030)
pH: 6 (ref 5.0–8.0)

## 2023-06-26 LAB — COMPREHENSIVE METABOLIC PANEL
ALT: 12 U/L (ref 0–44)
AST: 19 U/L (ref 15–41)
Albumin: 3.7 g/dL (ref 3.5–5.0)
Alkaline Phosphatase: 26 U/L — ABNORMAL LOW (ref 38–126)
Anion gap: 7 (ref 5–15)
BUN: 27 mg/dL — ABNORMAL HIGH (ref 8–23)
CO2: 24 mmol/L (ref 22–32)
Calcium: 9.2 mg/dL (ref 8.9–10.3)
Chloride: 107 mmol/L (ref 98–111)
Creatinine, Ser: 1.35 mg/dL — ABNORMAL HIGH (ref 0.61–1.24)
GFR, Estimated: 48 mL/min — ABNORMAL LOW (ref >60)
Glucose, Bld: 107 mg/dL — ABNORMAL HIGH (ref 70–99)
Potassium: 4.7 mmol/L (ref 3.5–5.1)
Sodium: 138 mmol/L (ref 135–145)
Total Bilirubin: 0.6 mg/dL (ref <1.2)
Total Protein: 7 g/dL (ref 6.5–8.1)

## 2023-06-26 NOTE — Discharge Instructions (Addendum)
Your labs, EKG, head CT, urine showed no significant abnormality today.  I recommend close follow-up with your primary care doctor.

## 2023-06-26 NOTE — ED Provider Notes (Signed)
[facility] Provider Note    Event Date/Time   First MD Initiated Contact with Patient 06/26/23 0308     (approximate)   History   Altered Mental Status   HPI  Elijah Collins. is a 89 y.o. male with history of CVA on Plavix, hyperlipidemia, CHF status post pacemaker, chronic kidney disease who presents to the emergency department with an episode of confusion tonight.  States he woke up and felt confused.  States this lasted for about 30 minutes.  He feels back to his baseline now.  No recent falls, head injury, headache, numbness, tingling, weakness, chest pain, shortness of breath, fevers, cough, vomiting or diarrhea.  Lives at home alone.   History provided by patient.    Past Medical History:  Diagnosis Date   Asthma    Cardiac arrhythmia    CHF (congestive heart failure) (HCC)    Hyperlipidemia    Shortness of breath    Stroke (HCC)    TIA (transient ischemic attack)     Past Surgical History:  Procedure Laterality Date   APPENDECTOMY  2011   [facility]   HERNIA REPAIR Right 10 years ago   [facility]  [facility]   PACEMAKER IMPLANT     PACEMAKER INSERTION Left 07/11/2019   Procedure: PACEMAKER CHANGEOUT;  Surgeon: Marcina Millard, MD;  Location: [facility] ORS;  Service: Cardiovascular;  Laterality: Left;    MEDICATIONS:  Prior to Admission medications   Medication Sig Start Date End Date Taking? Authorizing Provider  albuterol (VENTOLIN HFA) 108 (90 Base) MCG/ACT inhaler Inhale into the lungs. Patient not taking: Reported on 06/03/2023 04/08/23   [provider]  aspirin 81 MG chewable tablet Chew 81 mg by mouth at bedtime.    [provider]  clopidogrel (PLAVIX) 75 MG tablet Take 75 mg by mouth daily. Patient not taking: Reported on 06/03/2023    [provider]  guaiFENesin-dextromethorphan (ROBITUSSIN DM) 100-10 MG/5ML syrup Take 10 mLs by mouth every 4 (four) hours as needed for cough. Patient not taking: Reported on  06/03/2023 04/30/22   Hollice Espy, MD  montelukast (SINGULAIR) 10 MG tablet Take 1 tablet by mouth at bedtime. Patient not taking: Reported on 06/03/2023 02/25/23 02/25/24  [provider]  Multiple Vitamin (MULTI-VITAMINS) TABS Take 1 tablet by mouth daily.    [provider]  nitroGLYCERIN (NITROSTAT) 0.4 MG SL tablet Place under the tongue. Place 1 tablet (0.4 mg total) under the tongue every 5 (five) minutes as needed for Chest pain May take up to 3 doses. 11/17/22 11/17/23  [provider]  torsemide (DEMADEX) 20 MG tablet Take 20 mg by mouth daily. Patient not taking: Reported on 06/03/2023 09/03/22   [provider]  traZODone (DESYREL) 50 MG tablet Take 1 tablet by mouth at bedtime. Patient not taking: Reported on 06/03/2023 08/31/22 08/31/23  [provider]  Dwyane Luo 100-62.5-25 MCG/ACT AEPB Inhale into the lungs. Patient not taking: Reported on 06/03/2023 05/10/23   [provider]  vitamin B-12 (CYANOCOBALAMIN) 500 MCG tablet Take 500 mcg by mouth daily. Patient not taking: Reported on 06/03/2023    [provider]    Physical Exam   Triage Vital Signs: ED Triage Vitals  Encounter Vitals Group     BP 06/26/23 0310 (!) 152/71     Systolic BP Percentile --      Diastolic BP Percentile --      Pulse Rate 06/26/23 0310 71     Resp 06/26/23  0310 18     Temp 06/26/23 0310 98.2 F (36.8 C)     Temp Source 06/26/23 0310 Oral     SpO2 06/26/23 0310 97 %     Weight 06/26/23 0309 171 lb (77.6 kg)     Height 06/26/23 0309 5\' 10"  (1.778 m)     Head Circumference --      Peak Flow --      Pain Score 06/26/23 0309 0     Pain Loc --      Pain Education --      Exclude from Growth Chart --     Most recent vital signs: Vitals:   06/26/23 0310 06/26/23 0318  BP: (!) 152/71 (!) 152/71  Pulse: 71 65  Resp: 18 18  Temp: 98.2 F (36.8 C) 98.2 F (36.8 C)  SpO2: 97% 97%    CONSTITUTIONAL: Alert, responds  appropriately to questions. Well-appearing; well-nourished, elderly, no distress, appears younger than stated age, oriented x 4 HEAD: Normocephalic, atraumatic EYES: Conjunctivae clear, pupils appear equal, sclera nonicteric ENT: normal nose; moist mucous membranes NECK: Supple, normal ROM CARD: RRR; S1 and S2 appreciated RESP: Normal chest excursion without splinting or tachypnea; breath sounds clear and equal bilaterally; no wheezes, no rhonchi, no rales, no hypoxia or respiratory distress, speaking full sentences ABD/GI: Non-distended; soft, non-tender, no rebound, no guarding, no peritoneal signs BACK: The back appears normal EXT: Normal ROM in all joints; no deformity noted, no edema SKIN: Normal color for age and race; warm; no rash on exposed skin NEURO: Moves all extremities equally, normal speech, normal sensation diffusely, cranial nerves II through XII intact, no pronator drift, ambulates with cane which is his baseline PSYCH: The patient's mood and manner are appropriate.   ED Results / Procedures / Treatments   LABS: (all labs ordered are listed, but only abnormal results are displayed) Labs Reviewed  CBC WITH DIFFERENTIAL/PLATELET - Abnormal; Notable for the following components:      Result Value   RBC 3.57 (*)    Hemoglobin 11.6 (*)    HCT 35.4 (*)    All other components within normal limits  COMPREHENSIVE METABOLIC PANEL - Abnormal; Notable for the following components:   Glucose, Bld 107 (*)    BUN 27 (*)    Creatinine, Ser 1.35 (*)    Alkaline Phosphatase 26 (*)    GFR, Estimated 48 (*)    All other components within normal limits  URINALYSIS, ROUTINE W REFLEX MICROSCOPIC - Abnormal; Notable for the following components:   Color, Urine YELLOW (*)    APPearance CLEAR (*)    All other components within normal limits     EKG:  EKG Interpretation Date/Time:  Saturday June 26 2023 03:15:22 EST Ventricular Rate:  66 PR Interval:    QRS Duration:  77 QT  Interval:  390 QTC Calculation: 409 R Axis:   68  Text Interpretation: Normal sinus rhythm No significant change since last tracing Confirmed by Rochele Raring [phone]) on 06/26/2023 3:24:51 AM         RADIOLOGY: My personal review and interpretation of imaging: CT head unremarkable.  I have personally reviewed all radiology reports.   CT HEAD WO CONTRAST ( )  Result Date: 06/26/2023 CLINICAL DATA:  Altered mental status EXAM: CT HEAD WITHOUT CONTRAST TECHNIQUE: Contiguous axial images were obtained from the base of the skull through the vertex without intravenous contrast. RADIATION DOSE REDUCTION: This exam was performed according to the departmental dose-optimization program which includes automated exposure  control, adjustment of the mA and/or kV according to patient size and/or use of iterative reconstruction technique. COMPARISON:  02/21/2023 FINDINGS: Brain: Normal anatomic configuration. Moderate parenchymal volume loss is commensurate with the patient's age. Stable mild periventricular white matter changes are present likely reflecting the sequela of small vessel ischemia. No abnormal intra or extra-axial mass lesion or fluid collection. No abnormal mass effect or midline shift. No evidence of acute intracranial hemorrhage or infarct. Mild ventriculomegaly is commensurate with the degree parenchymal volume loss and likely represents the sequela of central atrophy. Cerebellum unremarkable. Vascular: No asymmetric hyperdense vasculature at the skull base. Skull: Intact Sinuses/Orbits: Paranasal sinuses are clear. Orbits are unremarkable. Other: Mastoid air cells and middle ear cavities are clear. IMPRESSION: 1. No acute intracranial hemorrhage or infarct. 2. Stable senescent change. Electronically Signed   By: Helyn Numbers M.D.   On: 06/26/2023 03:47     PROCEDURES:  Critical Care performed: No    .1-3 Lead EKG Interpretation  Performed by: Hong Moring, Layla Maw, DO Authorized by:  Sy Saintjean, Layla Maw, DO     Interpretation: normal     ECG rate:  65   ECG rate assessment: normal     Rhythm: sinus rhythm     Ectopy: none     Conduction: normal       IMPRESSION / MDM / ASSESSMENT AND PLAN / ED COURSE  I reviewed the triage vital signs and the nursing notes.    Patient here with confusion that has resolved.  The patient is on the cardiac monitor to evaluate for evidence of arrhythmia and/or significant heart rate changes.   DIFFERENTIAL DIAGNOSIS (includes but not limited to):   TIA, CVA, intracranial hemorrhage, anemia, electrolyte derangement, UTI, dehydration   Patient's presentation is most consistent with acute presentation with potential threat to life or bodily function.   PLAN: Will obtain labs, urine, CT head.  EKG shows no arrhythmia, ischemia.  Patient is alert and oriented, neurologically intact at this time.   MEDICATIONS GIVEN IN ED: Medications - No data to display   ED COURSE: Patient's pacemaker interrogated.  Patient had 1 monitored VT episode but it is unclear when this occurred on the faxed report.  Although this was the cause of his confusion.  No other acute abnormalities noted.  Labs show no hemoglobin of 11.6 which is his baseline.  Mild chronic kidney disease which is also stable.  Normal electrolytes.  Urine shows no infection.  CT head reviewed and interpreted by myself and the radiologist and shows no acute abnormality.  Patient is still neurologically intact.  I feel he is safe for discharge home with follow-up with his PCP.   At this time, I do not feel there is any life-threatening condition present. I reviewed all nursing notes, vitals, pertinent previous records.  All lab and urine results, EKGs, imaging ordered have been independently reviewed and interpreted by myself.  I reviewed all available radiology reports from any imaging ordered this visit.  Based on my assessment, I feel the patient is safe to be discharged home  without further emergent workup and can continue workup as an outpatient as needed. Discussed all findings, treatment plan as well as usual and customary return precautions.  They verbalize understanding and are comfortable with this plan.  Outpatient follow-up has been provided as needed.  All questions have been answered.    CONSULTS:  none   OUTSIDE RECORDS REVIEWED: Reviewed last internal medicine visit on 05/10/2023.  FINAL CLINICAL IMPRESSION(S) / ED DIAGNOSES   Final diagnoses:  Confusion     Rx / DC Orders   ED Discharge Orders     None        Note:  This document was prepared using Dragon voice recognition software and may include unintentional dictation errors.   Bartholomew Ramesh, Layla Maw, DO 06/26/23 [phone]

## 2023-06-26 NOTE — ED Notes (Signed)
Pt given water and graham crackers

## 2023-06-26 NOTE — ED Triage Notes (Addendum)
Pt to ED via EMS from home, pt lives alone and called EMS for confusion tonight. Pt states he woke up tonight around 0100 and had some confusion, pt is unable to describe what he was confused about but states he just couldn't get his bearings. Pt denies pain, denies speech changes, weakness or numbness. Pt is alert and oriented x4.

## 2023-08-19 NOTE — Progress Notes (Signed)
 See telephone encounter on 08/10/23

## 2023-08-30 NOTE — Progress Notes (Signed)
 Subjective:     Patient ID: Elijah Collins is a 89 y.o. male.  Cough  : PRESENTS WITH 4 DAYS OF COUGH AND CONGESTION. NO FEVER. SOM WHEEZING WHEN HE LAYS DOWN. . NO TX PTA. RECORDS REVIEWED.NO N/V/D. NO KNOW N EXPOSURES  Past Medical History:  has a past medical history of Allergic rhinitis, Asthma (HHS-HCC) (01/14/2014), Chronic cough, GERD (gastroesophageal reflux disease), History of Clostridium difficile, Hyperlipidemia, Sinus bradycardia, and Stroke (CMS/HHS-HCC). Past Surgical History:  has a past surgical history that includes Insert / replace / remove pacemaker; Hernia repair; Cyst on neck removed; Colonoscopy (05/08/2003); egd (05/08/2003); Colonoscopy; and Upper gastrointestinal endoscopy. Family History: family history includes Diabetes type II in his brother; Emphysema in his father; Obesity in his brother; Stroke in his mother and sister. Social History:  reports that he has never smoked. He has never used smokeless tobacco. He reports that he does not drink alcohol and does not use drugs. Prior to encounter Medications:  Current Outpatient Medications on File Prior to Visit  Medication Sig Dispense Refill  . multivitamin tablet Take 1 tablet by mouth once daily    . nitroGLYcerin (NITROSTAT) 0.4 MG SL tablet Place 1 tablet (0.4 mg total) under the tongue every 5 (five) minutes as needed for Chest pain May take up to 3 doses. 25 tablet 1  . sertraline  (ZOLOFT ) 50 MG tablet Take 1 tablet (50 mg total) by mouth once daily for 60 days 30 tablet 1  . albuterol  90 mcg/actuation inhaler Inhale 2 inhalations into the lungs every 6 (six) hours as needed for Wheezing for up to 90 days (Patient not taking: Reported on 08/30/2023) 3 each 1  . aspirin  81 MG chewable tablet Take 81 mg by mouth once daily. (Patient not taking: Reported on 08/30/2023)    . cyanocobalamin  (VITAMIN B12) 500 MCG tablet Take 1 tablet (500 mcg total) by mouth once daily 90 tablet 1  . fluticasone-umeclidinium-vilanterol  (TRELEGY ELLIPTA) 100-62.5-25 mcg inhaler Inhale 1 Puff into the lungs once daily (Patient not taking: Reported on 08/30/2023) 28 each 3  . mirtazapine (REMERON) 7.5 MG tablet TAKE ONE TABLET AT BEDTIME (Patient not taking: Reported on 08/30/2023) 30 tablet 0  . montelukast (SINGULAIR) 10 mg tablet Take 1 tablet (10 mg total) by mouth at bedtime (Patient not taking: Reported on 08/30/2023) 30 tablet 5  . predniSONE  (DELTASONE ) 10 MG tablet 4,4,3,3,2,2,1,1 (Patient not taking: Reported on 08/30/2023) 20 tablet 0   No current facility-administered medications on file prior to visit.   Allergies: has No Known Allergies.  This an established patient is here today for: office visit.  Review of Systems  Respiratory:  Positive for cough.        Objective:   Physical Exam Vitals and nursing note reviewed.  Constitutional:      General: He is not in acute distress.    Appearance: Normal appearance. He is not ill-appearing or toxic-appearing.  HENT:     Head: Normocephalic and atraumatic.     Right Ear: Tympanic membrane and ear canal normal.     Left Ear: Tympanic membrane normal.     Nose: Congestion and rhinorrhea present.     Mouth/Throat:     Mouth: Mucous membranes are moist.     Pharynx: Oropharynx is clear.  Cardiovascular:     Rate and Rhythm: Normal rate and regular rhythm.  Pulmonary:     Effort: Pulmonary effort is normal. No respiratory distress.     Breath sounds: Normal breath sounds.  Comments: CONGESTED COUGH Musculoskeletal:     Cervical back: Normal range of motion and neck supple. No rigidity.  Lymphadenopathy:     Cervical: No cervical adenopathy.  Skin:    General: Skin is warm and dry.     Findings: No rash.  Neurological:     Mental Status: He is alert.        Assessment:     1. Bronchitis   -     doxycycline (VIBRA-TABS) 100 MG tablet; Take 1 tablet (100 mg total) by mouth 2 (two) times daily for 10 days -     benzonatate  (TESSALON ) 200 MG capsule;  Take 1 capsule (200 mg total) by mouth 3 (three) times daily as needed for up to 7 days -     predniSONE  (DELTASONE ) 20 MG tablet; Take 1 tablet (20 mg total) by mouth 2 (two) times daily for 5 days  2. Encounter for observation for suspected exposure to other biological agents ruled out   -     Extended Respiratory Viral Panel - [facility]; Future  3. Sinus congestion   -     doxycycline (VIBRA-TABS) 100 MG tablet; Take 1 tablet (100 mg total) by mouth 2 (two) times daily for 10 days -     predniSONE  (DELTASONE ) 20 MG tablet; Take 1 tablet (20 mg total) by mouth 2 (two) times daily for 5 days     Plan:     Patient Instructions  Tested negative.  Antibiotic Cough med  Prednisone  Hydrate. Monitor symptoms.  Follow up with pcp as needed To er if symptoms worsen

## 2023-09-10 NOTE — Progress Notes (Signed)
 Goals     . Exercise (x goals)     3x weekly     . Maintain health/healthy lifestyle

## 2023-09-10 NOTE — Progress Notes (Signed)
 Chief Complaint  Patient presents with  . Follow-up    HPI  Elijah Collins is a 89 y.o. here for a Follow up Hx of Pacemaker and TIA's  Has been feeling well. Cough has resolved  Denies headache or dizziness  Balance has been poor. Occasionally staggers but no falls  States he is more forgetful  Also has problems with sleep maintenance No chest pains  Mood is better - (Wife passed away in 11/10/2018) Denies chest pains Has lost some weight  Non Smoker. No alcohol Appetite is fair. Labs: Hgb: 12.6 Sugar: 104 BUN 37 Se Creat: 1.4   (EGFR; 46)  A1c; 6.3 Total Cholesterol: 244, Triglycerides; 113 TSH: 3.740   ROS Rest of 10 point review of systems is normal.  Outpatient Encounter Medications as of 09/10/2023  Medication Sig Dispense Refill  . albuterol  90 mcg/actuation inhaler Inhale 2 inhalations into the lungs every 6 (six) hours as needed for Wheezing for up to 90 days 3 each 1  . cyanocobalamin  (VITAMIN B12) 500 MCG tablet Take 1 tablet (500 mcg total) by mouth once daily 90 tablet 1  . mirtazapine (REMERON) 7.5 MG tablet TAKE ONE TABLET AT BEDTIME 30 tablet 0  . multivitamin tablet Take 1 tablet by mouth once daily    . nitroGLYcerin (NITROSTAT) 0.4 MG SL tablet Place 1 tablet (0.4 mg total) under the tongue every 5 (five) minutes as needed for Chest pain May take up to 3 doses. 25 tablet 1  . sertraline  (ZOLOFT ) 50 MG tablet Take 1 tablet (50 mg total) by mouth once daily for 60 days 30 tablet 1  . aspirin  81 MG chewable tablet Take 81 mg by mouth once daily. (Patient not taking: Reported on 09/10/2023)    . [EXPIRED] doxycycline (VIBRA-TABS) 100 MG tablet Take 1 tablet (100 mg total) by mouth 2 (two) times daily for 10 days 20 tablet 0  . fluticasone-umeclidinium-vilanterol (TRELEGY ELLIPTA) 100-62.5-25 mcg inhaler Inhale 1 Puff into the lungs once daily (Patient not taking: Reported on 09/10/2023) 28 each 3  . montelukast (SINGULAIR) 10 mg tablet Take 1 tablet (10 mg total) by  mouth at bedtime (Patient not taking: Reported on 09/10/2023) 30 tablet 5  . predniSONE  (DELTASONE ) 10 MG tablet 4,4,3,3,2,2,1,1 (Patient not taking: Reported on 09/10/2023) 20 tablet 0   No facility-administered encounter medications on file as of 09/10/2023.    Allergies as of 09/10/2023  . (No Known Allergies)    Past Medical History:  Diagnosis Date  . Allergic rhinitis   . Asthma (HHS-HCC) 01/14/2014  . Chronic cough   . GERD (gastroesophageal reflux disease)   . History of Clostridium difficile   . Hyperlipidemia   . Sinus bradycardia   . Stroke (CMS/HHS-HCC)     Past Surgical History:  Procedure Laterality Date  . COLONOSCOPY  05/08/2003  . EGD  05/08/2003  . COLONOSCOPY    . Cyst on neck removed    . HERNIA REPAIR    . INSERT / REPLACE / REMOVE PACEMAKER    . UPPER GASTROINTESTINAL ENDOSCOPY      Vitals:   09/10/23 0959  BP: 130/70  Pulse: 88    Body mass index is 25.48 kg/m.   Appointment on 09/03/2023  Component Date Value Ref Range Status  . WBC (White Blood Cell Count) 09/03/2023 13.1 (H)  4.1 - 10.2 10^3/uL Final  . RBC (Red Blood Cell Count) 09/03/2023 3.83 (L)  4.69 - 6.13 10^6/uL Final  . Hemoglobin 09/03/2023 12.6 (L)  14.1 - 18.1 gm/dL Final  . Hematocrit 98/68/7974 37.4 (L)  40.0 - 52.0 % Final  . MCV (Mean Corpuscular Volume) 09/03/2023 97.7  80.0 - 100.0 fl Final  . MCH (Mean Corpuscular Hemoglobin) 09/03/2023 32.9 (H)  27.0 - 31.2 pg Final  . MCHC (Mean Corpuscular Hemoglobin * 09/03/2023 33.7  32.0 - 36.0 gm/dL Final  . Platelet Count 09/03/2023 238  150 - 450 10^3/uL Final  . RDW-CV (Red Cell Distribution Widt* 09/03/2023 12.8  11.6 - 14.8 % Final  . MPV (Mean Platelet Volume) 09/03/2023 10.5  9.4 - 12.4 fl Final  . Neutrophils 09/03/2023 7.74  1.50 - 7.80 10^3/uL Final  . Lymphocytes 09/03/2023 4.48 (H)  1.00 - 3.60 10^3/uL Final  . Monocytes 09/03/2023 0.79  0.00 - 1.50 10^3/uL Final  . Eosinophils 09/03/2023 0.02  0.00 - 0.55 10^3/uL Final   . Basophils 09/03/2023 0.01  0.00 - 0.09 10^3/uL Final  . Neutrophil % 09/03/2023 59.0  32.0 - 70.0 % Final  . Lymphocyte % 09/03/2023 34.2  10.0 - 50.0 % Final  . Monocyte % 09/03/2023 6.0  4.0 - 13.0 % Final  . Eosinophil % 09/03/2023 0.2 (L)  1.0 - 5.0 % Final  . Basophil% 09/03/2023 0.1  0.0 - 2.0 % Final  . Immature Granulocyte % 09/03/2023 0.5  <=0.7 % Final  . Immature Granulocyte Count 09/03/2023 0.07 (H)  <=0.06 10^3/L Final  . Glucose 09/03/2023 104  70 - 110 mg/dL Final  . Sodium 98/68/7974 138  136 - 145 mmol/L Final  . Potassium 09/03/2023 4.5  3.6 - 5.1 mmol/L Final  . Chloride 09/03/2023 104  97 - 109 mmol/L Final  . Carbon Dioxide (CO2) 09/03/2023 27.5  22.0 - 32.0 mmol/L Final  . Urea Nitrogen (BUN) 09/03/2023 37 (H)  7 - 25 mg/dL Final  . Creatinine 98/68/7974 1.4 (H)  0.7 - 1.3 mg/dL Final  . Glomerular Filtration Rate (eGFR) 09/03/2023 46 (L)  >60 mL/min/1.73sq m Final   CKD-EPI (2021) does not include patient's race in the calculation of eGFR.  Monitoring changes of plasma creatinine and eGFR over time is useful for monitoring kidney function.   Interpretive Ranges for eGFR (CKD-EPI 2021):  eGFR:       >60 mL/min/1.73 sq. m - Normal eGFR:       30-59 mL/min/1.73 sq. m - Moderately Decreased eGFR:       15-29 mL/min/1.73 sq. m  - Severely Decreased eGFR:       < 15 mL/min/1.73 sq. m  - Kidney Failure    Note: These eGFR calculations do not apply in acute situations when eGFR is changing rapidly or patients on dialysis.  . Calcium  09/03/2023 9.8  8.7 - 10.3 mg/dL Final  . AST  98/68/7974 13  8 - 39 U/L Final  . ALT  09/03/2023 13  6 - 57 U/L Final  . Alk Phos (alkaline Phosphatase) 09/03/2023 30 (L)  34 - 104 U/L Final  . Albumin 09/03/2023 4.2  3.5 - 4.8 g/dL Final  . Bilirubin, Total 09/03/2023 0.4  0.3 - 1.2 mg/dL Final  . Protein, Total 09/03/2023 6.9  6.1 - 7.9 g/dL Final  . A/G Ratio 98/68/7974 1.6  1.0 - 5.0 gm/dL Final  . Hemoglobin J8R 09/03/2023 6.3  (H)  4.2 - 5.6 % Final  . Average Blood Glucose (Calc) 09/03/2023 134  mg/dL Final  . Cholesterol, Total 09/03/2023 244 (H)  100 - 200 mg/dL Final  . Triglyceride 98/68/7974 88  35 -  199 mg/dL Final  . HDL (High Density Lipoprotein) Cho* 09/03/2023 64.0  29.0 - 71.0 mg/dL Final  . LDL Calculated 09/03/2023 837 (H)  0 - 130 mg/dL Final  . VLDL Cholesterol 09/03/2023 18  mg/dL Final  . Cholesterol/HDL Ratio 09/03/2023 3.8   Final  . Creatinine, Random Urine 09/03/2023 66.8  40.0 - 300.0 mg/dL Final  . Urine Albumin, Random 09/03/2023 <7    mg/L Final  . Urine Albumin/Creatinine Ratio 09/03/2023 <10.5  <30.0 ug/mg Final   Urine:         Spot collection              (g/mg creatinine)     Normal               < 30   Moderately          30-299         increased   Clinical             >=300 albuminuria  . Thyroid Stimulating Hormone (TSH) 09/03/2023 3.740  0.450-5.330 uIU/ml uIU/mL Final  . Color 09/03/2023 Light Yellow  Colorless, Straw, Light Yellow, Yellow, Dark Yellow Final  . Clarity 09/03/2023 Clear  Clear Final  . Specific Gravity 09/03/2023 1.017  1.005 - 1.030 Final  . pH, Urine 09/03/2023 6.0  5.0 - 8.0 Final  . Protein, Urinalysis 09/03/2023 Negative  Negative mg/dL Final  . Glucose, Urinalysis 09/03/2023 Negative  Negative mg/dL Final  . Ketones, Urinalysis 09/03/2023 Negative  Negative mg/dL Final  . Blood, Urinalysis 09/03/2023 Negative  Negative Final  . Nitrite, Urinalysis 09/03/2023 Negative  Negative Final  . Leukocyte Esterase, Urinalysis 09/03/2023 Negative  Negative Final  . Bilirubin, Urinalysis 09/03/2023 Negative  Negative Final  . Urobilinogen, Urinalysis 09/03/2023 0.2  0.2 - 1.0 mg/dL Final  . WBC, UA 98/68/7974 2  <=5 /hpf Final  . Red Blood Cells, Urinalysis 09/03/2023 1  <=3 /hpf Final  . Bacteria, Urinalysis 09/03/2023 0-5  0 - 5 /hpf Final  . Squamous Epithelial Cells, Urinaly* 09/03/2023 0  /hpf Final  . Magnesium  09/03/2023 2.1  1.8 - 2.5 mg/dL  Final  Office Visit on 08/30/2023  Component Date Value Ref Range Status  . Influenza A PCR 08/30/2023 Negative  Negative Final  . Influenza B PCR 08/30/2023 Negative  Negative Final  . RSV PCR 08/30/2023 Negative  Negative Final  . SARS-CoV2 PCR 08/30/2023 Negative  Negative Final    Exam  Blood pressure 130/70, pulse 88, height 177.8 cm (5' 10), weight 80.6 kg (177 lb 9.6 oz), SpO2 98%.   Wt Readings from Last 3 Encounters:  09/10/23 80.6 kg (177 lb 9.6 oz)  08/30/23 83.8 kg (184 lb 12.8 oz)  07/13/23 80.3 kg (177 lb)    Blood pressure 130/70, pulse 88, height 177.8 cm (5' 10), weight 80.6 kg (177 lb 9.6 oz), SpO2 98%. Body mass index is 25.48 kg/m.   General. Alert oriented x3  Skin. No suspicious lesions or moles.   Eyes. Sclera and conjunctiva clear; pupils equal round and reactive to light ; extraocular movements intact Ears. External normal; canals clear; tympanic membranes normal Nose. Mucosa healthy without drainage or ulceration Oropharynx. No suspicious lesions Neck. No swelling, masses, stiffness, pain, limited movement, carotid pulses normal bilaterally, thyroid normal size, no masses palpated.  No bruits Lungs. Respirations unlabored;Scattered expiratory wheezes +  Back. No spinal deformity Cardiovascular. Heart regular rate and rhythm without murmurs, gallops, or rubs Abdomen. Soft; non tender; non distended; normoactive bowel  sounds; no masses or organomegaly RECTAL: Declined Lymph Nodes. No significant cervical, supraclavicular, axillary or inguinal lymphadenopathy noted Musculoskeletal. No deformities; no active joint inflammation Peripheral pulses felt  Extremities. Normal, no edema Neurologic. Alert and oriented; speech intact; face symmetrical; moves all extremities well   Assessment and Plan   1 Persistent cough/ Wheezing; Improved  2 Bilat lower extremity pain/ Tiredness of legs; No longer on Gabapentin  On OTC Alpha Lipoic acid and Magnesium    3  Hx of Pacemaker; sees Dr. Ammon 4 Insomnia ; On Remeron  5 Anemia: Hgb is12.6 -  Monitor  6 Hx of TIA; On Plavix  and Lipitor. 7 Renal insufficiency;(sec Creat; 1.4 - EGFR; 46)  Increase fluid intake  Monitor  8 Elevated PSA (13.41)- Declines Urology referral- Declines repeat testing  9 Emotional lability/ Depression; Prescription sent for Zoloft  10 Health Maintenance; Increase fluid intake  Up to date with  Flu shot Pneumovax, Inj Tdap, and COVID 19 vaccine   Labs 1 week prior to next visit  Follow up in 6 months    Tamra Leventhal  MD

## 2023-09-22 NOTE — Progress Notes (Signed)
 Established Patient Visit   Chief Complaint: Chief Complaint  Patient presents with  . Pacer-ICD  . Congestive Heart Failure   Date of Service: 09/22/2023 Date of Birth: 08/07/1924 PCP: [name], MD  History of Present Illness: [name] is a 89 y.o.male patient who returns for    1. Sinus bradycardia status post dual-chamber pacemaker    2.  Hyperlipidemia  3.  History of transient ischemic attack, 2012, 11/10/2016, 07/12/2018  4.  Asthma   5.  Dual-chamber pacemaker change on 07/18/2019  6.  COVID-19 02/10/2021  7.  Brief, subclinical AF on pacemaker interrogation 04/15/2021  The patient had a TIA on 11/10/16, which manifested as confusion and altered mental status. Carotid ultrasound and head CT were unremarkable. 2D echocardiogram on 11/09/16 revealed normal left ventricular function with LVEF 55-60%, and mild mitral and tricuspid regurgitation. 48 hour Holter monitor on 01/14/17 revealed predominant atrial pacing with ventricular sensing.  There were occasional premature atrial contractions with infrequent atrial runs, the longest lasting 7 beats.  Pacemaker interrogation on 01/07/17 revealed normal dual-chamber pacemaker function with an underlying rhythm of junctional rhythm at a ventricular rate in the 20s.   The patient experienced recurrent TIA on 07/12/2018, again with confusion and altered mental status.  2D echocardiogram 07/12/2018 revealed normal left ventricular function, with LVEF 55 to 60%. Lexiscan Myoview on 07/30/2015 revealed normal sinus rhythm. Gated scintigraphy revealed normal left ventricular function with LVEF 56%.  SPECT imaging revealed normal wall motion with no evidence for scar or ischemia.   The patient was seen in the office 07/06/2019 after recent ER visit, and for discussion of dual-chamber pacemaker generator change-out, which is currently at [facility]. The patient reported an approximate 3-4 year history of progressive exertional shortness of breath.  He has been able to take 2 mile walks on most days, until 4 weeks prior to requiring hospitalization due to worsening shortness of breath. He denied chest pain. He had no peripheral edema. He had no palpitations. He has intermittent dizziness with exertion without presyncope or syncope. Chest xray was negative for acute cardiopulmonary disease. ECG revealed ventricular paced rhythm at a rate of 65 bpm. High-sensitivity troponin was normal x 2. BNP was elevated to 664. He was evaluated by Ellouise Class, FNP after discharge, and was not started on diuretic, and no changes were made to his medications. The patient does report that he has been eating a lot of TV dinners lately. Pacemaker interrogation on 04/15/2021 revealed normal dual-chamber pacemaker function, 2 episodes of atrial tachycardia/atrial fibrillation the longest lasting 10 minutes, predominant atrial pacing, longevity of 8.6 years.  Patient was admitted 02/10/2021 with generalized weakness, diagnosed with COVID-19 viral illness, hospitalized for 3 days, experienced exertional dyspnea and chronic cough.   The patient was seen at the [facility] clinic walk-in clinic on 01/10/2022 for shortness of breath that had progressively worsened over the last 6 weeks.  Up until about 1 month ago, the patient had been walking 1 mile a day but due to fatigue, progressive exertional dyspnea, and exercise intolerance, he has stopped.  EKG on 01/10/2022 revealed sinus rhythm without evidence of ischemia.  CBC was negative for anemia. BNP was normal.    The patient return today for a 59-month follow-up, and reports doing well from a cardiovascular perspective. He is in the process of selling his house, down-sizing, and plans to move to [facility] [place]. This process has been causing him stress. He denies chest pain.  He has chronic exertional dyspnea,  which is stable and unchanged.  He denies palpitations or heart racing.  He denies peripheral edema.  The patient is  active for his age.  2D echocardiogram 02/25/2022 revealed LVEF 55% with mild mitral and tricuspid regurgitation.  Lexiscan Myoview 02/25/2022 revealed LVEF 57% without evidence for scar or ischemia.  Chest CT 06/01/2022 revealed a 9 mm nodule right upper lobe and 18 mm nodule left upper lobe.  Repeat chest CT is scheduled for 09/2022.   Pacemaker interrogation 03/03/2023 revealed normal functioning dual-chamber pacemaker with predominant atrial pacing, with longevity of 6.8 years with minimal atrial fibrillation, longest episode lasting 49 seconds.  Right atrial output was increased to 2 V, right ventricular output was increased to 3 V. He has repeat pacemaker interrogation 08/2023; results not available to review at this time.   The patient has hyperlipidemia; LDL cholesterol was 162 on 09/03/2023, no longer on atorvastatin .  Past Medical and Surgical History  Past Medical History Past Medical History:  Diagnosis Date  . Allergic rhinitis   . Asthma (HHS-HCC) 01/14/2014  . Chronic cough   . GERD (gastroesophageal reflux disease)   . History of Clostridium difficile   . Hyperlipidemia   . Sinus bradycardia   . Stroke (CMS/HHS-HCC)     Past Surgical History He has a past surgical history that includes Insert / replace / remove pacemaker; Hernia repair; Cyst on neck removed; Colonoscopy (05/08/2003); egd (05/08/2003); Colonoscopy; and Upper gastrointestinal endoscopy.   Medications and Allergies  Current Medications  Current Outpatient Medications  Medication Sig Dispense Refill  . mirtazapine (REMERON) 7.5 MG tablet Take 1 tablet (7.5 mg total) by mouth at bedtime 90 tablet 1  . multivitamin tablet Take 1 tablet by mouth once daily    . sertraline  (ZOLOFT ) 50 MG tablet Take 1 tablet (50 mg total) by mouth once daily for 60 days 90 tablet 1  . albuterol  90 mcg/actuation inhaler Inhale 2 inhalations into the lungs every 6 (six) hours as needed for Wheezing for up to 90 days 3 each 1  .  cyanocobalamin  (VITAMIN B12) 500 MCG tablet Take 1 tablet (500 mcg total) by mouth once daily 90 tablet 1   No current facility-administered medications for this visit.    Allergies: Patient has no known allergies.  Social and Family History  Social History  reports that he has never smoked. He has never used smokeless tobacco. He reports that he does not drink alcohol and does not use drugs.  Family History Family History  Problem Relation Name Age of Onset  . Stroke Mother    . Emphysema Father    . Diabetes type II Brother    . Obesity Brother    . Stroke Sister      Review of Systems   Review of Systems: The patient denies chest pain, with chronic exertional shortness of breath, without orthopnea, paroxysmal nocturnal dyspnea, pedal edema, palpitations, heart racing, presyncope, syncope, with insomnia. Review of 10 Systems is negative except as described above.  Physical Examination   Vitals:BP 128/72   Pulse 84   Ht 177.8 cm (5' 10)   Wt 78 kg (172 lb)   SpO2 97%   BMI 24.68 kg/m  Ht:177.8 cm (5' 10) Wt:78 kg (172 lb) ADJ:Anib surface area is 1.96 meters squared. Body mass index is 24.68 kg/m.  General: Alert and oriented. Well-appearing. No acute distress. HEENT: Pupils equally reactive to light and accomodation    Neck: Supple, no JVD Lungs: Normal effort of breathing; clear to  auscultation bilaterally; no wheezes, rales, rhonchi Heart: Regular rate and rhythm. No murmur, rub, or gallop Abdomen: nondistended Extremities: no cyanosis, clubbing, or edema Peripheral Pulses: 2+ radial  Skin: Warm, dry, no diaphoresis  Assessment   89 y.o. male with  1. Pacemaker   2. Need for vaccination   3. S/P placement of cardiac pacemaker   4. DOE (dyspnea on exertion)   5. History of TIA (transient ischemic attack)     89 year old gentleman status post dual-chamber pacemaker for sick sinus syndrome, with recent dual-chamber pacemaker change on 07/18/2019.  Patient  has a history of multiple TIAs. Carotid ultrasound and head CT were unremarkable. 2D echocardiogram in 07/2018 revealed normal LV function. 48-hour Holter monitor revealed predominant atrial pacing with ventricular sensing. There were occasional premature atrial contractions with infrequent atrial runs, the longest lasting 7 beats, of uncertain clinical significance. Lexiscan Myoview on 07/30/2015 normal left ventricular function with LVEF 56%. The patient was recently evaluated at [facility] ER on 06/22/2019 for 2 week history of progressively worsening exertional shortness of breath with intermittent associated lightheadedness, without presyncope, orthopnea, peripheral edema, or chest pain. BNP elevated to 664.    Repeat 2D echocardiogram 07/18/2019 revealed normal left ventricular function, with LVEF 50 to 55%, with mild valvular insufficiencies.  The patient was hospitalized 02/10/2021 with COVID-19 illness, subsequently experiencd exertional dyspnea which more recently has improved. The patient reports a 6 week history of progressive exertional dyspnea and exercise intolerance without chest pain, weight gain, peripheral edema, orthopnea, or palpitations. His main complaint at this time is extreme fatigue and easy fatigability.  BNP was normal, CBC showed no new or worsening anemia, and electrolytes were normal.  Lexiscan Myoview 02/25/2022 did not reveal evidence for scar or ischemia.  2D echocardiogram 02/25/2022 revealed normal left ventricular function with mild valvular insufficiencies.  Patient was seen at [facility] ED 07/22/2022 with chest pain, exertional in nature, with nondiagnostic ECG, clinically improved on isosorbide  mononitrate 30 mg daily, which he has since discontinued, and denies recurrent chest pain. He reports overall doing well.   Plan   1.  Continue current medications 2.  Recommend Mediterranean Diet 3.  Pacemaker interrogation as scheduled 4.  Defer chronic anticoagulation for brief,  subclinical AF  5.  Return to clinic for follow-up in 6 months  No orders of the defined types were placed in this encounter.   Return in about 6 months (around 03/21/2024).    Attestation Statement:   I personally performed the service, non-incident to. (WP)   ANNA MARIA DRANE, PA-C

## 2023-10-21 NOTE — Progress Notes (Signed)
 History of Present Illness:   Elijah Collins is a 89 y.o. male here for   Verbally consented to the use of AI for note-taking.   Chief Complaint  Patient presents with  . Back Pain    Started Sunday, taking ibuprofen   . Sinus Problem    History of Present Illness Elijah Collins is a 89 year old male who presents with worsening sinus problems and back pain.  He has a long-standing history of sinus problems, which have worsened over the past two months. He experiences postnasal drip leading to wheezing, particularly at night, which improves when lying on his left side. No fever, chest pain, or shortness of breath when lying flat. He has not taken any medication specifically for his sinuses. He coughs up light green sputum.  He developed back pain after picking up sweet gum balls in his yard, with significant discomfort arising the following night. He has been taking ibuprofen 200 mg twice a day since Monday, which has provided relief. No radiating pain or discoloration in the area.  He has a history of frequent falls and weakness in his legs, which he attributes to his age. He uses a cane for mobility and is considering moving to a new apartment that requires a long walk to the dining room, raising concerns about managing the distance without assistance.   Past Medical History:   Past Medical History:  Diagnosis Date  . Allergic rhinitis   . Asthma (HHS-HCC) 01/14/2014  . Chronic cough   . GERD (gastroesophageal reflux disease)   . History of Clostridium difficile   . Hyperlipidemia   . Sinus bradycardia   . Stroke (CMS/HHS-HCC)     Past Surgical History:   Past Surgical History:  Procedure Laterality Date  . COLONOSCOPY  05/08/2003  . EGD  05/08/2003  . COLONOSCOPY    . Cyst on neck removed    . HERNIA REPAIR    . INSERT / REPLACE / REMOVE PACEMAKER    . UPPER GASTROINTESTINAL ENDOSCOPY      Allergies:  No Known Allergies  Current Medications:   Prior to Admission  medications   Medication Sig Taking? Last Dose  ibuprofen (MOTRIN) 200 MG tablet Take 200 mg by mouth every 6 (six) hours as needed for Pain Yes Taking  multivitamin tablet Take 1 tablet by mouth once daily Yes Taking  sertraline  (ZOLOFT ) 50 MG tablet Take 1 tablet (50 mg total) by mouth once daily for 60 days Yes Taking  traZODone (DESYREL) 50 MG tablet Take 1 tablet (50 mg total) by mouth at bedtime Yes Taking  albuterol  90 mcg/actuation inhaler Inhale 2 inhalations into the lungs every 6 (six) hours as needed for Wheezing for up to 90 days    cyanocobalamin  (VITAMIN B12) 500 MCG tablet Take 1 tablet (500 mcg total) by mouth once daily    lidocaine  (LIDODERM ) 5 % patch Place 1 patch onto the skin daily for 30 days Apply patch to the most painful area for up to 12 hours in a 24 hour period.    loratadine (CLARITIN) 10 mg tablet Take 1 tablet (10 mg total) by mouth once daily    mirtazapine (REMERON) 7.5 MG tablet Take 1 tablet (7.5 mg total) by mouth at bedtime Patient not taking: Reported on 10/21/2023  Not Taking  triamcinolone (NASACORT AQ) 55 mcg nasal spray Place 2 sprays into both nostrils once daily      Family History:   Family History  Problem Relation  Name Age of Onset  . Stroke Mother    . Emphysema Father    . Diabetes type II Brother    . Obesity Brother    . Stroke Sister      Social History:   Social History   Socioeconomic History  . Marital status: Widowed  Tobacco Use  . Smoking status: Never  . Smokeless tobacco: Never  Vaping Use  . Vaping status: Never Used  Substance and Sexual Activity  . Alcohol use: No    Alcohol/week: 0.0 standard drinks of alcohol  . Drug use: No  . Sexual activity: Defer   Social Drivers of Health   Financial Resource Strain: Low Risk  (10/21/2023)   Overall Financial Resource Strain (CARDIA)   . Difficulty of Paying Living Expenses: Not hard at all  Food Insecurity: No Food Insecurity (10/21/2023)   Hunger Vital Sign   .  Worried About Programme researcher, broadcasting/film/video in the Last Year: Never true   . Ran Out of Food in the Last Year: Never true  Transportation Needs: No Transportation Needs (10/21/2023)   PRAPARE - Transportation   . Lack of Transportation (Medical): No   . Lack of Transportation (Non-Medical): No  Physical Activity: Sufficiently Active (06/26/2019)   Received from [facility] [address], [place]   Exercise Vital Sign   . Days of Exercise per Week: 7 days   . Minutes of Exercise per Session: 60 min  Stress: No Stress Concern Present (06/26/2019)   Received from [facility], [facility]   [facility] - Occupational Stress Questionnaire   . Feeling of Stress : Only a little  Social Connections: Moderately Isolated (06/26/2019)   Received from [facility], [facility]   Social Connection and Isolation Panel [NHANES]   . Frequency of Communication with Friends and Family: More than three times a week   . Frequency of Social Gatherings with Friends and Family: More than three times a week   . Attends Religious Services: 1 to 4 times per year   . Active Member of Clubs or Organizations: No   . Attends Banker Meetings: Never   . Marital Status: Never married  Housing Stability: Low Risk  (10/21/2023)   Housing Stability Vital Sign   . Unable to Pay for Housing in the Last Year: No   . Number of Times Moved in the Last Year: 0   . Homeless in the Last Year: No    Review of Systems:   A 10 point review of systems is negative, except for the pertinent positives and negatives detailed in the HPI.  Vitals:   Vitals:   10/21/23 1302  BP: 126/72  Pulse: 80  Temp: 36.6 C (97.8 F)  SpO2: 98%  Weight: 79 kg (174 lb 3.2 oz)  Height: 177.8 cm (5' 10)     Body mass index is 25 kg/m.  Physical Exam:   Physical Exam Vitals and nursing note reviewed.  Constitutional:      General: He is not in acute distress.    Appearance: Normal appearance. He is not  ill-appearing, toxic-appearing or diaphoretic.  HENT:     Head: Normocephalic and atraumatic.     Right Ear: External ear normal.     Left Ear: External ear normal.  Eyes:     General:        Right eye: No discharge.        Left eye: No discharge.     Conjunctiva/sclera:  Conjunctivae normal.  Cardiovascular:     Rate and Rhythm: Normal rate and regular rhythm.     Pulses: Normal pulses.     Heart sounds: Normal heart sounds. No murmur heard.    No friction rub. No gallop.  Pulmonary:     Effort: Pulmonary effort is normal. No respiratory distress.     Breath sounds: Normal breath sounds. No stridor. No wheezing, rhonchi or rales.  Chest:     Chest wall: No tenderness.  Skin:    General: Skin is warm and dry.     Capillary Refill: Capillary refill takes less than 2 seconds.  Neurological:     Mental Status: He is alert.     Assessment and Plan:  No results found for this visit on 10/21/23.  Diagnoses and all orders for this visit:  General weakness -     Nurse, learning disability -     Scooter  S/P placement of cardiac pacemaker -     Nurse, learning disability -     Scooter  DOE (dyspnea on exertion) -     Nurse, learning disability -     Scooter  History of TIA (transient ischemic attack) -     Nurse, learning disability -     Scooter  Exercise intolerance -     Nurse, learning disability -     Psychiatric nurse  Frequent falls -     Nurse, learning disability -     Psychiatric nurse  Allergic rhinitis, unspecified seasonality, unspecified trigger  Acute left-sided low back pain without sciatica  Other orders -     lidocaine  (LIDODERM ) 5 % patch; Place 1 patch onto the skin daily for 30 days Apply patch to the most painful area for up to 12 hours in a 24 hour period. -     triamcinolone (NASACORT AQ) 55 mcg nasal spray; Place 2 sprays into both nostrils once daily -     loratadine (CLARITIN) 10 mg tablet; Take 1 tablet (10 mg total) by mouth once daily    Assessment & Plan Allergic Rhinitis Chronic sinus issues with postnasal  drip causing nocturnal wheezing. Symptoms have worsened over the past two months, likely due to seasonal allergies. No fever, chest pain, significant sore throat, or nasal congestion. Coughing up light green sputum. No indication of bacterial infection; therefore, no antibiotics prescribed. - Recommend over-the-counter Claritin (loratadine) for allergy relief, as it is non-drowsy. - Consider Nasacort nasal spray if symptoms persist, pending insurance coverage. - Contact provider if symptoms worsen for potential antibiotic prescription.  Back Pain Acute back pain following physical activity, localized to the back without radiation to legs or arms. No discoloration or bruising. Pain managed with ibuprofen. Lidocaine  patches considered for additional relief, pending insurance approval. - Continue ibuprofen 200 mg twice daily as needed for pain. - Consider lidocaine  patches for additional pain relief, pending insurance approval. - Encourage rest and avoidance of strenuous activities.  Mobility Issues Difficulty walking long distances due to weakness and exercise intolerance. Frequent falls, possibly related to leg weakness and TIA. Moving to a new residence with long distances to dining facilities, necessitating mobility aids. Rollator walker and scooter recommended to improve mobility and safety. Insurance may require occupational therapy evaluation for scooter approval. - Provide prescription for a rollator walker with a seat. - Submit order for a scooter to insurance; may require occupational therapy evaluation for approval. - Discuss process with medical supply company and insurance for coverage details.  Follow up He will call the office with any new or  worsening symptoms. There are no Patient Instructions on file for this visit.   This note has been created using automated tools and reviewed for accuracy by provider.  Patient received an After Visit Summary    Attestation Statement:    I personally performed the service, non-incident to. (WP)   GLENDA MACARIO HADDOCK, NP

## 2023-11-30 ENCOUNTER — Other Ambulatory Visit: Payer: Medicare Other

## 2023-12-03 ENCOUNTER — Inpatient Hospital Stay: Attending: Oncology

## 2023-12-03 DIAGNOSIS — D631 Anemia in chronic kidney disease: Secondary | ICD-10-CM | POA: Insufficient documentation

## 2023-12-03 DIAGNOSIS — D472 Monoclonal gammopathy: Secondary | ICD-10-CM | POA: Insufficient documentation

## 2023-12-03 DIAGNOSIS — N183 Chronic kidney disease, stage 3 unspecified: Secondary | ICD-10-CM | POA: Diagnosis not present

## 2023-12-03 DIAGNOSIS — R0602 Shortness of breath: Secondary | ICD-10-CM | POA: Insufficient documentation

## 2023-12-03 LAB — CBC WITH DIFFERENTIAL (CANCER CENTER ONLY)
Abs Immature Granulocytes: 0.06 10*3/uL (ref 0.00–0.07)
Basophils Absolute: 0 10*3/uL (ref 0.0–0.1)
Basophils Relative: 0 %
Eosinophils Absolute: 0.5 10*3/uL (ref 0.0–0.5)
Eosinophils Relative: 7 %
HCT: 37.1 % — ABNORMAL LOW (ref 39.0–52.0)
Hemoglobin: 12.1 g/dL — ABNORMAL LOW (ref 13.0–17.0)
Immature Granulocytes: 1 %
Lymphocytes Relative: 38 %
Lymphs Abs: 2.6 10*3/uL (ref 0.7–4.0)
MCH: 32.1 pg (ref 26.0–34.0)
MCHC: 32.6 g/dL (ref 30.0–36.0)
MCV: 98.4 fL (ref 80.0–100.0)
Monocytes Absolute: 0.5 10*3/uL (ref 0.1–1.0)
Monocytes Relative: 7 %
Neutro Abs: 3.1 10*3/uL (ref 1.7–7.7)
Neutrophils Relative %: 47 %
Platelet Count: 207 10*3/uL (ref 150–400)
RBC: 3.77 MIL/uL — ABNORMAL LOW (ref 4.22–5.81)
RDW: 12.5 % (ref 11.5–15.5)
WBC Count: 6.7 10*3/uL (ref 4.0–10.5)
nRBC: 0 % (ref 0.0–0.2)

## 2023-12-03 LAB — CMP (CANCER CENTER ONLY)
ALT: 14 U/L (ref 0–44)
AST: 18 U/L (ref 15–41)
Albumin: 3.9 g/dL (ref 3.5–5.0)
Alkaline Phosphatase: 29 U/L — ABNORMAL LOW (ref 38–126)
Anion gap: 8 (ref 5–15)
BUN: 28 mg/dL — ABNORMAL HIGH (ref 8–23)
CO2: 25 mmol/L (ref 22–32)
Calcium: 9.5 mg/dL (ref 8.9–10.3)
Chloride: 107 mmol/L (ref 98–111)
Creatinine: 1.33 mg/dL — ABNORMAL HIGH (ref 0.61–1.24)
GFR, Estimated: 49 mL/min — ABNORMAL LOW (ref >60)
Glucose, Bld: 125 mg/dL — ABNORMAL HIGH (ref 70–99)
Potassium: 4.4 mmol/L (ref 3.5–5.1)
Sodium: 140 mmol/L (ref 135–145)
Total Bilirubin: 0.6 mg/dL (ref 0.0–1.2)
Total Protein: 7.2 g/dL (ref 6.5–8.1)

## 2023-12-03 LAB — IRON AND TIBC
Iron: 119 ug/dL (ref 45–182)
Saturation Ratios: 29 % (ref 17.9–39.5)
TIBC: 410 ug/dL (ref 250–450)
UIBC: 291 ug/dL

## 2023-12-03 LAB — RETIC PANEL
Immature Retic Fract: 9 % (ref 2.3–15.9)
RBC.: 3.75 MIL/uL — ABNORMAL LOW (ref 4.22–5.81)
Retic Count, Absolute: 43.5 10*3/uL (ref 19.0–186.0)
Retic Ct Pct: 1.2 % (ref 0.4–3.1)
Reticulocyte Hemoglobin: 35 pg (ref >27.9)

## 2023-12-03 LAB — FERRITIN: Ferritin: 72 ng/mL (ref 24–336)

## 2023-12-05 LAB — MISC LABCORP TEST (SEND OUT): Labcorp test code: 143000

## 2023-12-06 LAB — KAPPA/LAMBDA LIGHT CHAINS
Kappa free light chain: 18.9 mg/L (ref 3.3–19.4)
Kappa, lambda light chain ratio: 0.32 (ref 0.26–1.65)
Lambda free light chains: 59.3 mg/L — ABNORMAL HIGH (ref 5.7–26.3)

## 2023-12-08 LAB — MULTIPLE MYELOMA PANEL, SERUM
Albumin SerPl Elph-Mcnc: 3.6 g/dL (ref 2.9–4.4)
Albumin/Glob SerPl: 1.2 (ref 0.7–1.7)
Alpha 1: 0.2 g/dL (ref 0.0–0.4)
Alpha2 Glob SerPl Elph-Mcnc: 0.7 g/dL (ref 0.4–1.0)
B-Globulin SerPl Elph-Mcnc: 0.9 g/dL (ref 0.7–1.3)
Gamma Glob SerPl Elph-Mcnc: 1.3 g/dL (ref 0.4–1.8)
Globulin, Total: 3.1 g/dL (ref 2.2–3.9)
IgA: 49 mg/dL — ABNORMAL LOW (ref 61–437)
IgG (Immunoglobin G), Serum: 555 mg/dL — ABNORMAL LOW (ref 603–1613)
IgM (Immunoglobulin M), Srm: 1256 mg/dL — ABNORMAL HIGH (ref 15–143)
M Protein SerPl Elph-Mcnc: 0.7 g/dL — ABNORMAL HIGH
Total Protein ELP: 6.7 g/dL (ref 6.0–8.5)

## 2023-12-10 ENCOUNTER — Encounter: Payer: Self-pay | Admitting: Oncology

## 2023-12-10 ENCOUNTER — Inpatient Hospital Stay (HOSPITAL_BASED_OUTPATIENT_CLINIC_OR_DEPARTMENT_OTHER): Payer: Medicare Other | Admitting: Oncology

## 2023-12-10 VITALS — BP 119/76 | HR 86 | Temp 97.8°F | Resp 14 | Wt 174.0 lb

## 2023-12-10 DIAGNOSIS — D631 Anemia in chronic kidney disease: Secondary | ICD-10-CM

## 2023-12-10 DIAGNOSIS — N183 Chronic kidney disease, stage 3 unspecified: Secondary | ICD-10-CM

## 2023-12-10 DIAGNOSIS — D472 Monoclonal gammopathy: Secondary | ICD-10-CM

## 2023-12-10 DIAGNOSIS — N1831 Chronic kidney disease, stage 3a: Secondary | ICD-10-CM | POA: Diagnosis not present

## 2023-12-10 NOTE — Assessment & Plan Note (Signed)
 Slightly decreased hemoglobin. Monitor.  Lab Results  Component Value Date   HGB 12.1 (L) 12/03/2023   TIBC 410 12/03/2023   IRONPCTSAT 29 12/03/2023   FERRITIN 72 12/03/2023

## 2023-12-10 NOTE — Assessment & Plan Note (Signed)
 IgM Lamda MGUS I discussed with patient about the diagnosis of IgM MGUS which is an asymptomatic condition which has a small risk of progression to smoldering [place] macroglobulinemia and to symptomatic [place] macroglobulinemia, and less often to lymphoma or AL amyloidosis. Infrequently, IgM MGUS can progress to IgM multiple myeloma. Discussed with patient that additional testing includes bone marrow biopsy, PET or skeletal survey.  Due to his advance age, share decision was made to hold off additional work up for now.  Lab Results  Component Value Date   MPROTEIN 0.7 (H) 12/03/2023   KPAFRELGTCHN 18.9 12/03/2023   LAMBDASER 59.3 (H) 12/03/2023   KAPLAMBRATIO 0.32 12/03/2023    Recommend observation.

## 2023-12-10 NOTE — Assessment & Plan Note (Signed)
Encourage oral hydration and avoid nephrotoxins.  Mild anemia, observe.

## 2023-12-10 NOTE — Progress Notes (Signed)
 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 Infrequently, IgM MGUS can progress to IgM multiple myeloma. Discussed with patient that additional testing includes bone marrow biopsy, PET or skeletal survey.  Due to his advance age, share decision was made to hold off additional work up for now.  Lab Results  Component Value Date   MPROTEIN 0.7 (H) 12/03/2023   KPAFRELGTCHN 18.9 12/03/2023   LAMBDASER 59.3 (H) 12/03/2023   KAPLAMBRATIO 0.32 12/03/2023    Recommend observation.   Anemia in chronic kidney disease (CKD) Slightly decreased hemoglobin. Monitor.  Lab Results  Component Value Date   HGB 12.1 (L) 12/03/2023   TIBC 410 12/03/2023   IRONPCTSAT 29 12/03/2023   FERRITIN 72 12/03/2023      CKD (chronic kidney disease), stage III (HCC) Encourage oral hydration and avoid nephrotoxins.  Mild anemia, observe.   Orders Placed This Encounter  Procedures   CMP (Cancer Center only)    Standing Status:   Future    Expected Date:   06/11/2024    Expiration Date:   12/09/2024   CBC with Differential (Cancer Center Only)    Standing Status:   Future    Expected Date:   06/11/2024    Expiration Date:   12/09/2024   Kappa/lambda light chains    Standing Status:   Future    Expected Date:   06/11/2024    Expiration Date:   12/09/2024   Iron and TIBC    Standing Status:   Future    Expected Date:   06/11/2024     Expiration Date:   12/09/2024   Ferritin    Standing Status:   Future    Expected Date:   06/11/2024    Expiration Date:   12/09/2024   Multiple Myeloma Panel (SPEP&IFE w/QIG)    Standing Status:   Future    Expected Date:   06/11/2024    Expiration Date:   12/09/2024   Follow up in  6 months.  All questions were answered. The patient knows to call the clinic with any problems, questions or concerns.  Timmy Forbes, MD, PhD [facility] Hematology Oncology 12/10/2023     HISTORY OF PRESENTING ILLNESS:  Elijah Salyers. is a  89 y.o.  male with PMH listed below who was referred to me for anemia Reviewed patient's recent labs that was done.  He was found to have chronic anemia, and MCV is trending up.  Since his COVID 19 infection , has experienced SOB, fatigue. Was seen by pulmonology recently.   He had not noticed any recent bleeding such as epistaxis, hematuria or hematochezia.  He denies over the counter NSAID ingestion. He is on antiplatelets agent - Aspirin  81mg  daily and Plavix . He eats TV lunches and dinners.   INTERVAL HISTORY Elijah Plucinski. is a 89 y.o. male who has above history reviewed by me today presents for follow up visit for MGUS He reports feeling well. No new concerns   MEDICAL  HISTORY:  Past Medical History:  Diagnosis Date   Asthma    Cardiac arrhythmia    CHF (congestive heart failure) (HCC)    Hyperlipidemia    Shortness of breath    Stroke (HCC)    TIA (transient ischemic attack)     SURGICAL HISTORY: Past Surgical History:  Procedure Laterality Date   APPENDECTOMY  2011   [facility]   HERNIA REPAIR Right 10 years ago   [facility]  [facility]   PACEMAKER IMPLANT     PACEMAKER INSERTION Left 07/11/2019   Procedure: PACEMAKER CHANGEOUT;  Surgeon: Percival Brace, MD;  Location: [facility] ORS;  Service: Cardiovascular;  Laterality: Left;    SOCIAL HISTORY: Social History   Socioeconomic History   Marital status: Widowed    Spouse name: Not on file   Number of  children: Not on file   Years of education: Not on file   Highest education level: Not on file  Occupational History   Not on file  Tobacco Use   Smoking status: Never   Smokeless tobacco: Never  Vaping Use   Vaping status: Never Used  Substance and Sexual Activity   Alcohol use: No   Drug use: No   Sexual activity: Not Currently  Other Topics Concern   Not on file  Social History Narrative   Not on file   Social Drivers of Health   Financial Resource Strain: Low Risk  (10/21/2023)   Received from [facility]   Overall Financial Resource Strain (CARDIA)    Difficulty of Paying Living Expenses: Not hard at all  Food Insecurity: No Food Insecurity (10/21/2023)   Received from [facility]   Hunger Vital Sign    Worried About Running Out of Food in the Last Year: Never true    Ran Out of Food in the Last Year: Never true  Transportation Needs: No Transportation Needs (10/21/2023)   Received from [facility] - Transportation    In the past 12 months, has lack of transportation kept you from medical appointments or from getting medications?: No    Lack of Transportation (Non-Medical): No  Physical Activity: Sufficiently Active (06/26/2019)   Exercise Vital Sign    Days of Exercise per Week: 7 days    Minutes of Exercise per Session: 60 min  Stress: No Stress Concern Present (06/26/2019)   [employer] of Occupational Health - Occupational Stress Questionnaire    Feeling of Stress : Only a little  Social Connections: Moderately Isolated (06/26/2019)   Social Connection and Isolation Panel [NHANES]    Frequency of Communication with Friends and Family: More than three times a week    Frequency of Social Gatherings with Friends and Family: More than three times a week    Attends Religious Services: 1 to 4 times per year    Active Member of [employer] or Organizations: No    Attends Banker Meetings: Never     Marital Status: Never married  Intimate Partner Violence: Not At Risk (09/07/2022)   Humiliation, Afraid, Rape, and Kick questionnaire    Fear of Current or Ex-Partner: No    Emotionally Abused: No    Physically Abused: No    Sexually Abused: No    FAMILY HISTORY: Family History  Problem Relation Age of Onset   CVA Mother    COPD Father     ALLERGIES:  has no known allergies.  MEDICATIONS:  Current Outpatient Medications  Medication Sig Dispense  Refill   Multiple Vitamin (MULTI-VITAMINS) TABS Take 1 tablet by mouth daily.     albuterol  (VENTOLIN  HFA) 108 (90 Base) MCG/ACT inhaler Inhale into the lungs. (Patient not taking: Reported on 12/10/2023)     aspirin  81 MG chewable tablet Chew 81 mg by mouth at bedtime. (Patient not taking: Reported on 12/10/2023)     clopidogrel  (PLAVIX ) 75 MG tablet Take 75 mg by mouth daily. (Patient not taking: Reported on 06/03/2023)     guaiFENesin -dextromethorphan  (ROBITUSSIN DM) 100-10 MG/5ML syrup Take 10 mLs by mouth every 4 (four) hours as needed for cough. (Patient not taking: Reported on 12/10/2023) 118 mL 0   montelukast (SINGULAIR) 10 MG tablet Take 1 tablet by mouth at bedtime. (Patient not taking: Reported on 12/10/2023)     nitroGLYCERIN (NITROSTAT) 0.4 MG SL tablet Place under the tongue. Place 1 tablet (0.4 mg total) under the tongue every 5 (five) minutes as needed for Chest pain May take up to 3 doses.     torsemide (DEMADEX) 20 MG tablet Take 20 mg by mouth daily. (Patient not taking: Reported on 12/10/2023)     traZODone (DESYREL) 50 MG tablet Take 1 tablet by mouth at bedtime. (Patient not taking: Reported on 06/03/2023)     TRELEGY ELLIPTA 100-62.5-25 MCG/ACT AEPB Inhale into the lungs. (Patient not taking: Reported on 12/10/2023)     vitamin B-12 (CYANOCOBALAMIN ) 500 MCG tablet Take 500 mcg by mouth daily. (Patient not taking: Reported on 06/03/2023)     No current facility-administered medications for this visit.    Review of Systems   Constitutional:  Positive for fatigue. Negative for appetite change, chills, fever and unexpected weight change.  HENT:   Negative for hearing loss and voice change.   Eyes:  Negative for eye problems and icterus.  Respiratory:  Positive for shortness of breath. Negative for chest tightness and cough.   Cardiovascular:  Negative for chest pain and leg swelling.  Gastrointestinal:  Negative for abdominal distention and abdominal pain.  Endocrine: Negative for hot flashes.  Genitourinary:  Negative for difficulty urinating, dysuria and frequency.   Musculoskeletal:  Negative for arthralgias.  Skin:  Negative for itching and rash.  Neurological:  Negative for light-headedness and numbness.  Hematological:  Negative for adenopathy. Does not bruise/bleed easily.  Psychiatric/Behavioral:  Negative for confusion.     PHYSICAL EXAMINATION: ECOG PERFORMANCE STATUS: 2 - Symptomatic, <50% confined to bed Vitals:   12/10/23 1012  BP: 119/76  Pulse: 86  Resp: 14  Temp: 97.8 F (36.6 C)  SpO2: 97%   Filed Weights   12/10/23 1012  Weight: 174 lb (78.9 kg)    Physical Exam Constitutional:      General: He is not in acute distress. HENT:     Head: Normocephalic and atraumatic.  Eyes:     General: No scleral icterus. Cardiovascular:     Rate and Rhythm: Normal rate and regular rhythm.  Pulmonary:     Effort: Pulmonary effort is normal. No respiratory distress.     Breath sounds: No wheezing.  Abdominal:     General: There is no distension.     Palpations: Abdomen is soft.  Musculoskeletal:        General: No deformity. Normal range of motion.     Cervical back: Normal range of motion and neck supple.  Skin:    General: Skin is warm and dry.     Findings: No erythema.  Neurological:     Mental Status: He is alert and  oriented to person, place, and time. Mental status is at baseline.     Cranial Nerves: No cranial nerve deficit.  Psychiatric:        Mood and Affect: Mood normal.       LABORATORY DATA:  I have reviewed the data as listed    Latest Ref Rng & Units 12/03/2023    9:01 AM 06/26/2023    4:03 AM 05/24/2023    8:24 AM  CBC  WBC 4.0 - 10.5 K/uL 6.7  6.6  8.3   Hemoglobin 13.0 - 17.0 g/dL 40.9  81.1  91.4   Hematocrit 39.0 - 52.0 % 37.1  35.4  33.6   Platelets 150 - 400 K/uL 207  183  211       Latest Ref Rng & Units 12/03/2023    9:02 AM 06/26/2023    4:03 AM 05/24/2023    8:24 AM  CMP  Glucose 70 - 99 mg/dL 782  956  95   BUN 8 - 23 mg/dL 28  27  35   Creatinine 0.61 - 1.24 mg/dL 2.13  0.86  5.78   Sodium 135 - 145 mmol/L 140  138  139   Potassium 3.5 - 5.1 mmol/L 4.4  4.7  3.9   Chloride 98 - 111 mmol/L 107  107  107   CO2 22 - 32 mmol/L 25  24  23    Calcium  8.9 - 10.3 mg/dL 9.5  9.2  9.0   Total Protein 6.5 - 8.1 g/dL 7.2  7.0  6.9   Total Bilirubin 0.0 - 1.2 mg/dL 0.6  0.6  0.4   Alkaline Phos 38 - 126 U/L 29  26  27    AST 15 - 41 U/L 18  19  19    ALT 0 - 44 U/L 14  12  17        Component Value Date/Time   IRON 119 12/03/2023 0901   TIBC 410 12/03/2023 0901   FERRITIN 72 12/03/2023 0901   IRONPCTSAT 29 12/03/2023 0901     RADIOGRAPHIC STUDIES: I have personally reviewed the radiological images as listed and agreed with the findings in the report. No results found.

## 2024-02-08 ENCOUNTER — Encounter: Payer: Self-pay | Admitting: Child & Adolescent Psychiatry

## 2024-02-08 ENCOUNTER — Emergency Department
Admission: EM | Admit: 2024-02-08 | Discharge: 2024-02-08 | Disposition: A | Discharge status: Other Acute Inpatient Hospital | Source: Home / Self Care | Attending: Emergency Medicine | Admitting: Emergency Medicine

## 2024-02-08 ENCOUNTER — Inpatient Hospital Stay
Admission: RE | Admit: 2024-02-08 | Discharge: 2024-02-11 | DRG: 885 | Disposition: A | Discharge status: Home / Self Care | Source: Intra-hospital | Attending: Psychiatry | Admitting: Psychiatry

## 2024-02-08 DIAGNOSIS — Z634 Disappearance and death of family member: Secondary | ICD-10-CM | POA: Diagnosis not present

## 2024-02-08 DIAGNOSIS — D472 Monoclonal gammopathy: Secondary | ICD-10-CM | POA: Diagnosis present

## 2024-02-08 DIAGNOSIS — Z5911 Inadequate housing environmental temperature: Secondary | ICD-10-CM | POA: Diagnosis not present

## 2024-02-08 DIAGNOSIS — Z825 Family history of asthma and other chronic lower respiratory diseases: Secondary | ICD-10-CM

## 2024-02-08 DIAGNOSIS — Z8673 Personal history of transient ischemic attack (TIA), and cerebral infarction without residual deficits: Secondary | ICD-10-CM | POA: Insufficient documentation

## 2024-02-08 DIAGNOSIS — R45851 Suicidal ideations: Secondary | ICD-10-CM | POA: Insufficient documentation

## 2024-02-08 DIAGNOSIS — J45909 Unspecified asthma, uncomplicated: Secondary | ICD-10-CM | POA: Insufficient documentation

## 2024-02-08 DIAGNOSIS — Z5989 Other problems related to housing and economic circumstances: Secondary | ICD-10-CM

## 2024-02-08 DIAGNOSIS — R413 Other amnesia: Secondary | ICD-10-CM | POA: Diagnosis present

## 2024-02-08 DIAGNOSIS — N189 Chronic kidney disease, unspecified: Secondary | ICD-10-CM | POA: Diagnosis present

## 2024-02-08 DIAGNOSIS — E785 Hyperlipidemia, unspecified: Secondary | ICD-10-CM | POA: Diagnosis present

## 2024-02-08 DIAGNOSIS — Z823 Family history of stroke: Secondary | ICD-10-CM | POA: Diagnosis not present

## 2024-02-08 DIAGNOSIS — F332 Major depressive disorder, recurrent severe without psychotic features: Secondary | ICD-10-CM | POA: Diagnosis present

## 2024-02-08 DIAGNOSIS — Z1152 Encounter for screening for COVID-19: Secondary | ICD-10-CM | POA: Diagnosis not present

## 2024-02-08 DIAGNOSIS — Z950 Presence of cardiac pacemaker: Secondary | ICD-10-CM

## 2024-02-08 DIAGNOSIS — R410 Disorientation, unspecified: Secondary | ICD-10-CM | POA: Diagnosis present

## 2024-02-08 DIAGNOSIS — F32A Depression, unspecified: Secondary | ICD-10-CM | POA: Insufficient documentation

## 2024-02-08 LAB — CBC WITH DIFFERENTIAL/PLATELET
Abs Immature Granulocytes: 0.03 K/uL (ref 0.00–0.07)
Basophils Absolute: 0 K/uL (ref 0.0–0.1)
Basophils Relative: 0 %
Eosinophils Absolute: 0.2 K/uL (ref 0.0–0.5)
Eosinophils Relative: 3 %
HCT: 35.9 % — ABNORMAL LOW (ref 39.0–52.0)
Hemoglobin: 11.9 g/dL — ABNORMAL LOW (ref 13.0–17.0)
Immature Granulocytes: 0 %
Lymphocytes Relative: 39 %
Lymphs Abs: 2.7 K/uL (ref 0.7–4.0)
MCH: 32.6 pg (ref 26.0–34.0)
MCHC: 33.1 g/dL (ref 30.0–36.0)
MCV: 98.4 fL (ref 80.0–100.0)
Monocytes Absolute: 0.5 K/uL (ref 0.1–1.0)
Monocytes Relative: 7 %
Neutro Abs: 3.4 K/uL (ref 1.7–7.7)
Neutrophils Relative %: 51 %
Platelets: 192 K/uL (ref 150–400)
RBC: 3.65 MIL/uL — ABNORMAL LOW (ref 4.22–5.81)
RDW: 12.9 % (ref 11.5–15.5)
WBC: 6.9 K/uL (ref 4.0–10.5)
nRBC: 0 % (ref 0.0–0.2)

## 2024-02-08 LAB — URINE DRUG SCREEN, QUALITATIVE (ARMC ONLY)
Amphetamines, Ur Screen: NOT DETECTED
Barbiturates, Ur Screen: NOT DETECTED
Benzodiazepine, Ur Scrn: NOT DETECTED
Cannabinoid 50 Ng, Ur ~~LOC~~: NOT DETECTED
Cocaine Metabolite,Ur ~~LOC~~: NOT DETECTED
MDMA (Ecstasy)Ur Screen: NOT DETECTED
Methadone Scn, Ur: NOT DETECTED
Opiate, Ur Screen: NOT DETECTED
Phencyclidine (PCP) Ur S: NOT DETECTED
Tricyclic, Ur Screen: NOT DETECTED

## 2024-02-08 LAB — SARS CORONAVIRUS 2 BY RT PCR: SARS Coronavirus 2 by RT PCR: NEGATIVE

## 2024-02-08 LAB — COMPREHENSIVE METABOLIC PANEL WITH GFR
ALT: 15 U/L (ref 0–44)
AST: 20 U/L (ref 15–41)
Albumin: 3.3 g/dL — ABNORMAL LOW (ref 3.5–5.0)
Alkaline Phosphatase: 24 U/L — ABNORMAL LOW (ref 38–126)
Anion gap: 7 (ref 5–15)
BUN: 24 mg/dL — ABNORMAL HIGH (ref 8–23)
CO2: 24 mmol/L (ref 22–32)
Calcium: 9 mg/dL (ref 8.9–10.3)
Chloride: 107 mmol/L (ref 98–111)
Creatinine, Ser: 1.21 mg/dL (ref 0.61–1.24)
GFR, Estimated: 55 mL/min — ABNORMAL LOW (ref >60)
Glucose, Bld: 98 mg/dL (ref 70–99)
Potassium: 4.2 mmol/L (ref 3.5–5.1)
Sodium: 138 mmol/L (ref 135–145)
Total Bilirubin: 0.8 mg/dL (ref 0.0–1.2)
Total Protein: 6.1 g/dL — ABNORMAL LOW (ref 6.5–8.1)

## 2024-02-08 LAB — URINALYSIS, ROUTINE W REFLEX MICROSCOPIC
Bilirubin Urine: NEGATIVE
Glucose, UA: NEGATIVE mg/dL
Hgb urine dipstick: NEGATIVE
Ketones, ur: NEGATIVE mg/dL
Leukocytes,Ua: NEGATIVE
Nitrite: NEGATIVE
Protein, ur: NEGATIVE mg/dL
Specific Gravity, Urine: 1.005 (ref 1.005–1.030)
pH: 7 (ref 5.0–8.0)

## 2024-02-08 MED ORDER — OLANZAPINE 5 MG PO TBDP
5.0000 mg | ORAL_TABLET | Freq: Three times a day (TID) | ORAL | Status: DC | PRN
  Administered 2024-02-09: 5 mg via ORAL
  Filled 2024-02-08: qty 1

## 2024-02-08 MED ORDER — ALUM & MAG HYDROXIDE-SIMETH 200-200-20 MG/5ML PO SUSP: 15.0000 mL | Freq: Four times a day (QID) | ORAL | Status: DC | PRN

## 2024-02-08 MED ORDER — MAGNESIUM HYDROXIDE 400 MG/5ML PO SUSP: 15.0000 mL | Freq: Every day | ORAL | Status: DC | PRN

## 2024-02-08 MED ORDER — OLANZAPINE 10 MG IM SOLR: 5.0000 mg | Freq: Three times a day (TID) | INTRAMUSCULAR | Status: DC | PRN

## 2024-02-08 MED ORDER — ACETAMINOPHEN 325 MG PO TABS: 650.0000 mg | ORAL_TABLET | Freq: Four times a day (QID) | ORAL | Status: DC | PRN

## 2024-02-08 NOTE — ED Notes (Signed)
 Assumed care of patient, pt resting in stretcher, no distress noted, denies SI/HI at this time. Pt requesting something to eat, states he has not eaten lunch and is hungry

## 2024-02-08 NOTE — ED Provider Notes (Signed)
 [facility] Provider Note    Event Date/Time   First MD Initiated Contact with Patient 02/08/24 1510     (approximate)   History   Chief Complaint Suicidal   HPI  Elijah Collins. is a 89 y.o. male with past medical history of anemia, asthma, MGUS, CKD, and stroke who presents to the ED complaining of suicidal ideation.  Patient reports that he has been feeling increasingly depressed recently after having to sell off his home and all of his belongings in order to move into a nursing facility.  He states that he is obsessed with not being here anymore and has been thinking about harming himself.  He denies any specific plan at this time, has been taking medications as prescribed.  He denies any medical complaints.     Physical Exam   Triage Vital Signs: ED Triage Vitals [02/08/24 1052]  Encounter Vitals Group     BP (!) 144/69     Girls Systolic BP Percentile      Girls Diastolic BP Percentile      Boys Systolic BP Percentile      Boys Diastolic BP Percentile      Pulse Rate 76     Resp 18     Temp 98 F (36.7 C)     Temp Source Oral     SpO2 97 %     Weight      Height      Head Circumference      Peak Flow      Pain Score 0     Pain Loc      Pain Education      Exclude from Growth Chart     Most recent vital signs: Vitals:   02/08/24 1052  BP: (!) 144/69  Pulse: 76  Resp: 18  Temp: 98 F (36.7 C)  SpO2: 97%    Constitutional: Alert and oriented. Eyes: Conjunctivae are normal. Head: Atraumatic. Nose: No congestion/rhinnorhea. Mouth/Throat: Mucous membranes are moist.  Cardiovascular: Normal rate, regular rhythm. Grossly normal heart sounds.  2+ radial pulses bilaterally. Respiratory: Normal respiratory effort.  No retractions. Lungs CTAB. Gastrointestinal: Soft and nontender. No distention. Musculoskeletal: No lower extremity tenderness nor edema.  Neurologic:  Normal speech and language. No gross focal neurologic deficits  are appreciated.    ED Results / Procedures / Treatments   Labs (all labs ordered are listed, but only abnormal results are displayed) Labs Reviewed  COMPREHENSIVE METABOLIC PANEL WITH GFR - Abnormal; Notable for the following components:      Result Value   BUN 24 (*)    Total Protein 6.1 (*)    Albumin 3.3 (*)    Alkaline Phosphatase 24 (*)    GFR, Estimated 55 (*)    All other components within normal limits  CBC WITH DIFFERENTIAL/PLATELET - Abnormal; Notable for the following components:   RBC 3.65 (*)    Hemoglobin 11.9 (*)    HCT 35.9 (*)    All other components within normal limits  URINE DRUG SCREEN, QUALITATIVE ([facility] ONLY)  URINALYSIS, ROUTINE W REFLEX MICROSCOPIC    PROCEDURES:  Critical Care performed: No  Procedures   MEDICATIONS ORDERED IN ED: Medications - No data to display   IMPRESSION / MDM / ASSESSMENT AND PLAN / ED COURSE  I reviewed the triage vital signs and the nursing notes.  89 y.o. male with past medical history of CKD, MGUS, anemia, asthma, and stroke who presents to the ED complaining of increasing thoughts of suicide without a plan for the past week.  Patient's presentation is most consistent with acute presentation with potential threat to life or bodily function.  Differential diagnosis includes, but is not limited to, depression, anxiety, psychosis, suicidal ideation, medication noncompliance.  Patient nontoxic-appearing and in no acute distress, vital signs are unremarkable.  He denies any medical complaints and screening labs are unremarkable with no significant anemia, leukocytosis, electrolyte abnormality, or AKI.  LFTs are also unremarkable and patient may be medically cleared for psychiatric disposition.  We will maintain voluntary status, psychiatric evaluation is pending at this time.  The patient has been placed in psychiatric observation due to the need to provide a safe environment for the patient  while obtaining psychiatric consultation and evaluation, as well as ongoing medical and medication management to treat the patient's condition.  The patient has not been placed under full IVC at this time.      FINAL CLINICAL IMPRESSION(S) / ED DIAGNOSES   Final diagnoses:  Suicidal ideation     Rx / DC Orders   ED Discharge Orders     None        Note:  This document was prepared using Dragon voice recognition software and may include unintentional dictation errors.   Willo Dunnings, MD 02/08/24 1700

## 2024-02-08 NOTE — ED Notes (Signed)
 This RN called daughter Sharyne, updated her with POC, OK with patient to discuss with daughter.

## 2024-02-08 NOTE — ED Notes (Signed)
 Pt ambulated to interview room with this tech at his side for TTS. Pt has steady gait, but does appear to do better with someone/something to hold.

## 2024-02-08 NOTE — ED Notes (Signed)
 Pt ambulated back to 24H on his own after speaking with TTS.

## 2024-02-08 NOTE — ED Notes (Signed)
 VOL  GOING  TO  GERO  PSYCH

## 2024-02-08 NOTE — BH Assessment (Signed)
 Comprehensive Clinical Assessment (CCA) Screening, Triage and Referral Note   02/08/2024 Elijah Collins Sr. [id number]   Disposition: Dr. Ruther recommends inpatient hospitalization.    Per EDP's note: Pt to ED from [facility] with SI for the last 3-4 months. Pt states he was hospitalized for similar symptoms when his wife died 5 years ago and the catalyst for his current thoughts are moving out of his home. States he had a plan to poison himself with CO using a personal generator but has since sold it. Denies any suicide attempts/HI/toxic ingestion.   Upon evaluation with this clinician, the patient is alert, oriented x 3, and cooperative. Speech is clear, coherent and logical. Pt appears casual. Eye contact is fair. Mood is anxious and depressed; affect is congruent with mood. The thought process is logical and thought content is coherent. Pt endorses SI without a plan. Pt reports that he often thinks about different ways to harm himself but has not come up with a plan to actually do it. Pt reports that he often feels like he does not have anything to live for. Pt denies HI/AVH. There is no indication that the patient is responding to internal stimuli. No delusions elicited during this assessment.    Chief Complaint:  Chief Complaint  Patient presents with   Suicidal   Visit Diagnosis: Major Depressive Disorder   Patient Reported Information How did you hear about us ? -- [facility] ED)  What Is the Reason for Your Visit/Call Today? Per EDP's note: Pt to ED from [facility] with SI for the last 3-4 months. Pt states he was hospitalized for similar symptoms when his wife died 5 years ago and the catalyst for his current thoughts are moving out of his home. States he had a plan to poison himself with CO using a personal generator but has since sold it. Denies any suicide attempts/HI/toxic ingestion.   How Long Has This Been Causing You Problems? > than 6 months  What Do You Feel  Would Help You the Most Today? Treatment for Depression or other mood problem; Stress Management; Medication(s)   Have You Recently Had Any Thoughts About Hurting Yourself? Yes  Are You Planning to Commit Suicide/Harm Yourself At This time? No   Have you Recently Had Thoughts About Hurting Someone [name]? No  Are You Planning to Harm Someone at This Time? No  Explanation: Denies HI   Have You Used Any Alcohol or Drugs in the Past 24 Hours? No  How Long Ago Did You Use Drugs or Alcohol? N/A   What Did You Use and How Much? N/A  Do You Currently Have a Therapist/Psychiatrist? No  Name of Therapist/Psychiatrist: None reported  Have You Been Recently Discharged From Any Office Practice or Programs? No  Explanation of Discharge From Practice/Program: n/a   CCA Screening Triage Referral Assessment Type of Contact: Face-to-Face  Telemedicine Service Delivery:   Is this Initial or Reassessment?   Date Telepsych consult ordered in [facility]:    Time Telepsych consult ordered in [facility]:    Location of Assessment: [facility] ED  Provider Location: [facility] IP Behavior Medicine    Collateral Involvement: None   Does Patient Have a Court Appointed Legal Guardian?n/a Name and Contact of Legal Guardian: n/a If Minor and Not Living with Parent(s), Who has Custody? n/a  Is CPS involved or ever been involved? Never  Is APS involved or ever been involved? Never   Patient Determined To Be At Risk for Harm To Self or  Others Based on Review of Patient Reported Information or Presenting Complaint? Yes, for Self-Harm  Method: Plan without intent  Availability of Means: No access or NA  Intent: Vague intent or NA  Notification Required: No need or identified person  Additional Information for Danger to Others Potential: -- (n/a)  Additional Comments for Danger to Others Potential: n/a  Are There Guns or Other Weapons in Your Home? No  Types of Guns/Weapons: No accesss  Are These Weapons Safely  Secured?                            No  Who Could Verify You Are Able To Have These Secured: NO access  Do You Have any Outstanding Charges, Pending Court Dates, Parole/Probation? Denies pending legal charges  Contacted To Inform of Risk of Harm To Self or Others: -- (n/a)   Does Patient Present under Involuntary Commitment? No    [place] of Residence: [place]   Patient Currently Receiving the Following Services: Not Receiving Services   Determination of Need: Urgent (48 hours)   Options For Referral: Inpatient Hospitalization   Disposition Recommendation per psychiatric provider: We recommend inpatient psychiatric hospitalization when medically cleared. Patient is under voluntary admission status at this time; please IVC if attempts to leave hospital.  Elijah PARAS, MA, [facility]

## 2024-02-08 NOTE — ED Notes (Signed)
 Waiting on covid result to give report

## 2024-02-08 NOTE — ED Notes (Signed)
 Voluntary admission consent obtained

## 2024-02-08 NOTE — ED Triage Notes (Signed)
 Pt to ED from [facility] with SI for the last 3-4 months. Pt states he was hospitalized for similar symptoms when his wife died 5 years ago and the catalyst for his current thoughts are moving out of his home. States he had a plan to poison himself with CO using a personal generator but has since sold it. Denies any suicide attempts/HI/toxic ingestion.

## 2024-02-08 NOTE — BH Assessment (Signed)
 Patient has been accepted to [facility] Patient assigned to room 33 Accepting physician is Dr. Ruther. Attending is Donnelly MD Dx MDD Call report to [phone]. Representative was [facility] Danika.

## 2024-02-08 NOTE — ED Notes (Signed)
 This RN witnessed pt ambulating w/o assistance

## 2024-02-09 ENCOUNTER — Other Ambulatory Visit: Payer: Self-pay

## 2024-02-09 DIAGNOSIS — F332 Major depressive disorder, recurrent severe without psychotic features: Secondary | ICD-10-CM | POA: Diagnosis not present

## 2024-02-09 MED ORDER — ENSURE PLUS HIGH PROTEIN PO LIQD
237.0000 mL | Freq: Three times a day (TID) | ORAL | Status: DC
  Administered 2024-02-09 – 2024-02-11 (×7): 237 mL via ORAL

## 2024-02-09 MED ORDER — ADULT MULTIVITAMIN W/MINERALS CH
1.0000 | ORAL_TABLET | Freq: Every day | ORAL | Status: DC
  Filled 2024-02-09: qty 1

## 2024-02-09 MED ORDER — SERTRALINE HCL 25 MG PO TABS
25.0000 mg | ORAL_TABLET | Freq: Every day | ORAL | Status: DC
  Administered 2024-02-10 – 2024-02-11 (×2): 25 mg via ORAL
  Filled 2024-02-09 (×2): qty 1

## 2024-02-09 MED ORDER — ADULT MULTIVITAMIN W/MINERALS CH
1.0000 | ORAL_TABLET | Freq: Every day | ORAL | Status: DC
  Administered 2024-02-09 – 2024-02-11 (×3): 1 via ORAL
  Filled 2024-02-09 (×2): qty 1

## 2024-02-09 NOTE — Progress Notes (Signed)
 NUTRITION ASSESSMENT  Pt identified as at risk on the Malnutrition Screen Tool  INTERVENTION:  -Continue regular diet -Ensure Plus High Protein po TID, each supplement provides 350 kcal and 20 grams of protein  -MVI with minerals daily   NUTRITION DIAGNOSIS: Unintentional weight loss related to sub-optimal intake as evidenced by pt report.   Goal: Pt to meet >/= 90% of their estimated nutrition needs.  Monitor:  PO intake  Assessment:   Pt with past medical history of anemia, asthma, MGUS, CKD, and stroke who presents with suicidal ideation   Pt admitted with SI.   Per nursing notes, pt irritable and easily agitated. Since moving into an ALF 2 months ago, he has been experiencing depression, anxiety, poor concentration, decreased appetite, and loneliness secondary to loss of independence.   Pt currently on a regular diet. No meal completion data available to assess at thsi time.   Reviewed wt hx; pt has experienced a 14.5% wt loss over the past year. While this is not significant for time frame, it is concerning given history of poor appetite and advanced age. Pt would greatly benefit from addition of oral nutrition supplements.   Labs reviewed.    89 y.o. male  Height: Ht Readings from Last 1 Encounters:  02/08/24 5' 10 (1.778 m)    Weight: Wt Readings from Last 1 Encounters:  02/08/24 74.4 kg    Weight Hx: Wt Readings from Last 10 Encounters:  02/08/24 74.4 kg  12/10/23 78.9 kg  06/26/23 77.6 kg  06/03/23 82.8 kg  02/21/23 87 kg  11/30/22 87.4 kg  09/21/22 85.7 kg  09/07/22 89 kg  07/26/22 86.2 kg  05/16/22 83.9 kg    BMI:  Body mass index is 23.53 kg/m. BMI WDL.   Estimated Nutritional Needs: Kcal: 25-30 kcal/kg Protein: > 1 gram protein/kg Fluid: 1 ml/kcal  Diet Order:  Diet Order             Diet regular Room service appropriate? Yes; Fluid consistency: Thin  Diet effective now                  Pt is also offered choice of unit  snacks mid-morning and mid-afternoon.  Pt is eating as desired.   Lab results and medications reviewed.   Margery ORN, RD, LDN, CDCES Registered Dietitian III Certified Diabetes Care and Education Specialist If unable to reach this RD, please use RD Inpatient group chat on secure chat between hours of 8am-4 pm daily

## 2024-02-09 NOTE — Plan of Care (Signed)
 Patient is new to the unit haven't time to progress.   Problem: Education: Goal: Utilization of techniques to improve thought processes will improve 02/09/2024 0045 by Ezzard Stank, RN Outcome: Not Progressing 02/09/2024 0042 by Ezzard Stank, RN Outcome: Not Progressing Goal: Knowledge of the prescribed therapeutic regimen will improve 02/09/2024 0045 by Ezzard Stank, RN Outcome: Not Progressing 02/09/2024 0042 by Ezzard Stank, RN Outcome: Not Progressing   Problem: Activity: Goal: Interest or engagement in leisure activities will improve 02/09/2024 0045 by Ezzard Stank, RN Outcome: Not Progressing 02/09/2024 0042 by Ezzard Stank, RN Outcome: Not Progressing Goal: Imbalance in normal sleep/wake cycle will improve 02/09/2024 0045 by Ezzard Stank, RN Outcome: Not Progressing 02/09/2024 0042 by Ezzard Stank, RN Outcome: Not Progressing   Problem: Coping: Goal: Coping ability will improve 02/09/2024 0045 by Ezzard Stank, RN Outcome: Not Progressing 02/09/2024 0042 by Ezzard Stank, RN Outcome: Not Progressing Goal: Will verbalize feelings Outcome: Not Progressing   Problem: Safety: Goal: Ability to disclose and discuss suicidal ideas will improve Outcome: Not Progressing Goal: Ability to identify and utilize support systems that promote safety will improve Outcome: Not Progressing

## 2024-02-09 NOTE — Group Note (Signed)
 Date:  02/09/2024 Time:  10:22 PM  Group Topic/Focus:  Identifying Needs:   The focus of this group is to help patients identify their personal needs that have been historically problematic and identify healthy behaviors to address their needs.    Participation Level:  Active  Participation Quality:  Appropriate  Affect:  Appropriate  Cognitive:  Appropriate  Insight: None  Engagement in Group:  Engaged  Modes of Intervention:  Orientation and Socialization  Additional Comments:    Elijah Collins 02/09/2024, 10:22 PM

## 2024-02-09 NOTE — Progress Notes (Signed)
 Admission Note: 89 year old patient admitted to unit voluntarily for depression and SI with a plan x3 month. Pt present to the unit alert and oriented with some confusion. Pt is irritable and easily agitated. Good eye contact. Speech clear. States he moved into assisted living facility 2 months ago and experiencing feelings depression, anxiety, poor concentration, decreased appetite and loneliness. Pt said I had to give up all my things with no sense of control-I can't even drive my car. Reports a plan to harm self with carbon monoxide poisoning using his generator. Pt contract for safety. PH/O Asthma, CHF, SOB, stroke and pacemaker.  Zyprexa  5 mg po given PRN as ordered for agitation. Skin check with MHT. Skin warm, dry and intact. Pt searched and no contraband found. POC and unit policies explained. Verbalized understanding. Consents obtained. Food and fluids offered and accepted. Denies H/AVH. Q 15 min checks maintained for safety. No c/o pain/discomfort noted. Pt had no questions/concerns.

## 2024-02-09 NOTE — Group Note (Signed)
 Physical/Occupational Therapy Group Note  Group Topic: Transfer Training   Group Date: 02/09/2024 Start Time: 1300 End Time: 1330 Facilitators: Keelan Pomerleau, Alm Hamilton, PT   Group Description: Group educated on sequence and techniques to maximize safety with functional transfers.  Additionally, integrated education on impact of seating surfaces, use of assistive device and management of orthostasis with movement transitions.  Patients actively engaged with functional transfers (sit/stand) from various seating surfaces, with and without assist devices, working to integrate and retain education provided during session.  Allowed time for questions and further discussion on mobility concerns/needs.   Therapeutic Goal(s):  Identify and demonstrate safe technique for sit/stand transfers from various seating surfaces. Identify and demonstrate safe use of assistive devices with basic transfers and simple mobility. Identify and demonstrate ability to recognize signs/symptoms of orthostasis and appropriate compensatory/safety techniques.  Individual Participation: Did not attend  Participation Level:   Participation Quality:   Behavior:   Speech/Thought Process:   Affect/Mood:   Insight:   Judgement:   Individualization:   Modes of Intervention:   Patient Response to Interventions:    Plan: Continue to engage patient in PT/OT groups 1 - 2x/week.  CHARM Hamilton Bertin PT, DPT 02/09/24, 4:29 PM

## 2024-02-09 NOTE — [facility] Suicide Risk Assessment (Signed)
 [facility] Admission Suicide Risk Assessment   Nursing information obtained from:  Patient Demographic factors:  Age 89 or older Current Mental Status:  Suicidal ideation indicated by patient Loss Factors:  Loss of significant relationship Historical Factors:  NA Risk Reduction Factors:  Positive social support  Total Time spent with patient: 30 minutes Principal Problem: MDD (major depressive disorder), recurrent episode, severe (HCC) Diagnosis:  Principal Problem:   MDD (major depressive disorder), recurrent episode, severe (HCC)  Subjective Data: Elijah Collins. is a 89 y.o. male with past medical history of anemia, asthma, MGUS, CKD, and stroke who presents to the ED complaining of suicidal ideation.  Patient reports that he has been feeling increasingly depressed recently after having to sell off his home and all of his belongings in order to move into a nursing facility.  He states that he is obsessed with not being here anymore and has been thinking about harming himself.  He denies any specific plan at this time, has been taking medications as prescribed Patient is admitted to [facility] psych unit with Q15 min safety monitoring. Multidisciplinary team approach is offered. Medication management; group/milieu therapy is offered.   Continued Clinical Symptoms:  Alcohol Use Disorder Identification Test Final Score (AUDIT): 0 The Alcohol Use Disorders Identification Test, Guidelines for Use in Primary Care, Second Edition.  World Science writer [facility]). Score between 0-7:  no or low risk or alcohol related problems. Score between 8-15:  moderate risk of alcohol related problems. Score between 16-19:  high risk of alcohol related problems. Score 20 or above:  warrants further diagnostic evaluation for alcohol dependence and treatment.   CLINICAL FACTORS:   Depression:   Insomnia   Musculoskeletal: Strength & Muscle Tone: within normal limits Gait & Station: normal Patient leans:  N/A  Psychiatric Specialty Exam:  Presentation  General Appearance:  Appropriate for Environment; Casual  Eye Contact: Fair  Speech: Clear and Coherent  Speech Volume: Normal  Handedness: Right   Mood and Affect  Mood: Depressed  Affect: Depressed; Flat   Thought Process  Thought Processes: Coherent  Descriptions of Associations:Intact  Orientation:Partial  Thought Content:Logical  History of Schizophrenia/Schizoaffective disorder:No data recorded Duration of Psychotic Symptoms:No data recorded Hallucinations:Hallucinations: None  Ideas of Reference:None  Suicidal Thoughts:Suicidal Thoughts: No  Homicidal Thoughts:Homicidal Thoughts: No   Sensorium  Memory: Immediate Fair; Recent Poor; Remote Poor  Judgment: Impaired  Insight: Shallow   Executive Functions  Concentration: Fair  Attention Span: Fair  Recall: Poor  Fund of Knowledge: Fair  Language: Fair   Psychomotor Activity  Psychomotor Activity: Psychomotor Activity: Normal   Assets  Assets: Communication Skills; Desire for Improvement; Resilience; Social Support   Sleep  Sleep: Sleep: Fair    Physical Exam: Physical Exam ROS Blood pressure (!) 84/52, pulse 77, temperature 97.8 F (36.6 C), resp. rate 18, height 5' 10 (1.778 m), weight 74.4 kg, SpO2 100%. Body mass index is 23.53 kg/m.   COGNITIVE FEATURES THAT CONTRIBUTE TO RISK:  None    SUICIDE RISK:   Minimal: No identifiable suicidal ideation.  Patients presenting with no risk factors but with morbid ruminations; may be classified as minimal risk based on the severity of the depressive symptoms  PLAN OF CARE: Patient is admitted to [facility] psych unit with Q15 min safety monitoring. Multidisciplinary team approach is offered. Medication management; group/milieu therapy is offered.   I certify that inpatient services furnished can reasonably be expected to improve the patient's condition.   Allyn Foil, MD 02/09/2024,  8:29 PM

## 2024-02-09 NOTE — Progress Notes (Signed)
 Pt visualized coming out of his room and noted to attempt to push open exit door. Attempt made to redirect pt and pt noted to open up another's pt door and walk in. Staff entered pts room and pt standing in door way stating I need to call the police. I need my clothes. Pt escorted out of the others pt room and redirected to get his clothing items at the nurses station. Pt then noted to go back to his room and stated you all are not helping me at all. Pt irritable and fixated on calling the police and leaving. Pt noted to come out of his room dressed in personal clothing. Security called for assistance to deescalate pt. Security on unit attempt verbal deescalation. Pt stating they have my shoes, wallet it does not making any sense. Pt is confused. Pt more calm at this time but, is upset. After some time pt noted to return to his room.

## 2024-02-09 NOTE — Tx Team (Signed)
 Initial Treatment Plan 02/09/2024 12:47 AM Elijah LITTIE Pinal Sr. FMW:[id number]    PATIENT STRESSORS: Marital or family conflict   Traumatic event     PATIENT STRENGTHS: Ability for insight  Communication skills  Motivation for treatment/growth    PATIENT IDENTIFIED PROBLEMS: Depression  Suicidal ideation                   DISCHARGE CRITERIA:  Adequate post-discharge living arrangements Motivation to continue treatment in a less acute level of care Verbal commitment to aftercare and medication compliance  PRELIMINARY DISCHARGE PLAN: Return to previous living arrangement  PATIENT/FAMILY INVOLVEMENT: This treatment plan has been presented to and reviewed with the patient, Elijah LAW Sr.  The patient and family have been given the opportunity to ask questions and make suggestions.  Baker Kerns, RN 02/09/2024, 12:47 AM

## 2024-02-09 NOTE — Progress Notes (Signed)
   02/09/24 1100  Psych Admission Type (Psych Patients Only)  Admission Status Voluntary  Psychosocial Assessment  Patient Complaints Depression;Irritability;Worrying  Eye Contact Fair  Facial Expression Worried  Affect Irritable;Preoccupied  Surveyor, minerals Activity Slow  Appearance/Hygiene Unremarkable  Behavior Characteristics Impulsive;Irritable;Pacing  Mood Irritable  Thought Process  Coherency WDL  Content WDL  Delusions None reported or observed  Perception WDL  Hallucination None reported or observed  Judgment Impaired  Confusion Mild  Danger to Self  Current suicidal ideation? Denies  Agreement Not to Harm Self Yes  Description of Agreement verbally  Danger to Others  Danger to Others None reported or observed

## 2024-02-09 NOTE — H&P (Signed)
 Psychiatric Admission Assessment Adult  Patient Identification: Elijah BURMESTER Sr. MRN:  [id number] Date of Evaluation:  02/09/2024 Chief Complaint:  MDD (major depressive disorder), recurrent episode, severe (HCC) [F33.2]  Collateral from daughter [phone] obtained on 02/09/2024-daughter corroborated the patient's story and the symptoms of depression that started after his wife died 5 years ago and recent changes including moving to a new apartment, 2 days ago witnessing all his house furniture and things that he built for were put on sale and are gone, problems with the air conditioner in the current apartment, also acknowledging memory lapses and confusion at times.  Daughter and the family members are not concerned about any safety or suicidal behaviors of the patient.  History of Present Illness: Elijah Collins. is a 89 y.o. male with past medical history of anemia, asthma, MGUS, CKD, and stroke who presents to the ED complaining of suicidal ideation.  Patient reports that he has been feeling increasingly depressed recently after having to sell off his home and all of his belongings in order to move into a nursing facility.  He states that he is obsessed with not being here anymore and has been thinking about harming himself.  He denies any specific plan at this time, has been taking medications as prescribed Patient is admitted to [facility] psych unit with Q15 min safety monitoring. Multidisciplinary team approach is offered. Medication management; group/milieu therapy is offered.   On interview patient is noted to be resting in bed.  He was able to tell the month as July and the year is 2025 and the president as Nancyann Frohlich.  He does talk about feeling depressed when he lost his wife and reports that at that time he got grief counseling through hospice therapist.  He reports that intermittently he will talk about ready to die but denies having any active intention or plan.  He reports he is 22 and he  understands life ends at some point.  He reports having a supportive family and consistently denies not having any intent or plan of killing himself.  He denies HI.  He reports feeling tired but has fair appetite and sleep.  He denies feeling hopeless or worthless.  He denies any anxiety or panic attacks he denies auditory/visual hallucinations.  He denies having any previous attempts of suicide and denies having any family history of death by suicide.  Total Time spent with patient: 1 hour Sleep  Sleep:Sleep: Fair  Past Psychiatric History:  Psychiatric History:  Information collected from chart/patient  Prev Dx/Sx: depression Current Psych Provider: none reported Home Meds (current): unknown Previous Med Trials: zoloft  Therapy: none reported  Prior Psych Hospitalization: denies  Prior Self Harm: denies Prior Violence: denies  Family Psych History: denies Family Hx suicide: denies  Social History:  Educational Hx: GED Occupational Hx: SSI Legal Hx: denies Living Situation: at a independent living apartment Spiritual Hx: denies Access to weapons/lethal means: denies   Substance History Alcohol: denies  Tobacco: denies Illicit drugs: denies Prescription drug abuse: denies Rehab hx: denies Is the patient at risk to self? No.  Has the patient been a risk to self in the past 6 months? No.  Has the patient been a risk to self within the distant past? No.  Is the patient a risk to others? No.  Has the patient been a risk to others in the past 6 months? No.  Has the patient been a risk to others within the distant past? No.   [place]  Scale:  Flowsheet Row Admission (Current) from 02/08/2024 in [facility] BEHAVIORAL MEDICINE Most recent reading at 02/08/2024 10:00 PM ED from 02/08/2024 in [facility] Emergency Department at [facility] Most recent reading at 02/08/2024  4:00 PM ED from 02/21/2023 in [facility] Emergency Department at [facility] Most recent reading at  02/21/2023 12:50 AM  C-SSRS RISK CATEGORY High Risk High Risk No Risk     Past Medical History:  Past Medical History:  Diagnosis Date   Asthma    Cardiac arrhythmia    CHF (congestive heart failure) (HCC)    Hyperlipidemia    Shortness of breath    Stroke (HCC)    TIA (transient ischemic attack)     Past Surgical History:  Procedure Laterality Date   APPENDECTOMY  2011   [facility]   HERNIA REPAIR Right 10 years ago   [facility]   PACEMAKER IMPLANT     PACEMAKER INSERTION Left 07/11/2019   Procedure: PACEMAKER CHANGEOUT;  Surgeon: Ammon Blunt, MD;  Location: [facility] ORS;  Service: Cardiovascular;  Laterality: Left;   Family History:  Family History  Problem Relation Age of Onset   CVA Mother    COPD Father     Social History:  Social History   Substance and Sexual Activity  Alcohol Use No     Social History   Substance and Sexual Activity  Drug Use No      Allergies:  No Known Allergies Lab Results:  Results for orders placed or performed during the hospital encounter of 02/08/24 (from the past 48 hours)  Comprehensive metabolic panel     Status: Abnormal   Collection Time: 02/08/24 11:30 AM  Result Value Ref Range   Sodium 138 135 - 145 mmol/L   Potassium 4.2 3.5 - 5.1 mmol/L   Chloride 107 98 - 111 mmol/L   CO2 24 22 - 32 mmol/L   Glucose, Bld 98 70 - 99 mg/dL    Comment: Glucose reference range applies only to samples taken after fasting for at least 8 hours.   BUN 24 (H) 8 - 23 mg/dL   Creatinine, Ser 8.78 0.61 - 1.24 mg/dL   Calcium  9.0 8.9 - 10.3 mg/dL   Total Protein 6.1 (L) 6.5 - 8.1 g/dL   Albumin 3.3 (L) 3.5 - 5.0 g/dL   AST 20 15 - 41 U/L   ALT 15 0 - 44 U/L   Alkaline Phosphatase 24 (L) 38 - 126 U/L   Total Bilirubin 0.8 0.0 - 1.2 mg/dL   GFR, Estimated 55 (L) >60 mL/min    Comment: (NOTE) Calculated using the CKD-EPI Creatinine Equation (2021)    Anion gap 7 5 - 15    Comment: Performed at [facility], [address]  [place], [place] [address]  CBC with Diff     Status: Abnormal   Collection Time: 02/08/24 11:30 AM  Result Value Ref Range   WBC 6.9 4.0 - 10.5 K/uL   RBC 3.65 (L) 4.22 - 5.81 MIL/uL   Hemoglobin 11.9 (L) 13.0 - 17.0 g/dL   HCT 64.0 (L) 60.9 - 47.9 %   MCV 98.4 80.0 - 100.0 fL   MCH 32.6 26.0 - 34.0 pg   MCHC 33.1 30.0 - 36.0 g/dL   RDW 87.0 88.4 - 84.4 %   Platelets 192 150 - 400 K/uL   nRBC 0.0 0.0 - 0.2 %   Neutrophils Relative % 51 %   Neutro Abs 3.4 1.7 - 7.7 K/uL  Lymphocytes Relative 39 %   Lymphs Abs 2.7 0.7 - 4.0 K/uL   Monocytes Relative 7 %   Monocytes Absolute 0.5 0.1 - 1.0 K/uL   Eosinophils Relative 3 %   Eosinophils Absolute 0.2 0.0 - 0.5 K/uL   Basophils Relative 0 %   Basophils Absolute 0.0 0.0 - 0.1 K/uL   Immature Granulocytes 0 %   Abs Immature Granulocytes 0.03 0.00 - 0.07 K/uL    Comment: Performed at [facility], [address], [place], [place] [address]  SARS Coronavirus 2 by RT PCR (hospital order, performed in [facility] hospital lab) *cepheid single result test* Anterior Nasal Swab     Status: None   Collection Time: 02/08/24  5:21 PM   Specimen: Anterior Nasal Swab  Result Value Ref Range   SARS Coronavirus 2 by RT PCR NEGATIVE NEGATIVE    Comment: (NOTE) SARS-CoV-2 target nucleic acids are NOT DETECTED.  The SARS-CoV-2 RNA is generally detectable in upper and lower respiratory specimens during the acute phase of infection. The lowest concentration of SARS-CoV-2 viral copies this assay can detect is 250 copies / mL. A negative result does not preclude SARS-CoV-2 infection and should not be used as the sole basis for treatment or other patient management decisions.  A negative result may occur with improper specimen collection / handling, submission of specimen other than nasopharyngeal swab, presence of viral mutation(s) within the areas targeted by this assay, and inadequate number of viral copies (<250 copies / mL). A negative result  must be combined with clinical observations, patient history, and epidemiological information.  Fact Sheet for Patients:   RoadLapTop.co.za  Fact Sheet for Healthcare Providers: http://kim-Batts.com/  This test is not yet approved or  cleared by the [place]  FDA and has been authorized for detection and/or diagnosis of SARS-CoV-2 by FDA under an Emergency Use Authorization (EUA).  This EUA will remain in effect (meaning this test can be used) for the duration of the COVID-19 declaration under Section 564(b)(1) of the Act, 21 U.S.C. section 360bbb-3(b)(1), unless the authorization is terminated or revoked sooner.  Performed at [facility], [address] [place], [place] [address]   Urine Drug Screen, Qualitative     Status: None   Collection Time: 02/08/24  5:36 PM  Result Value Ref Range   Tricyclic, Ur Screen NONE DETECTED NONE DETECTED   Amphetamines, Ur Screen NONE DETECTED NONE DETECTED   MDMA (Ecstasy)Ur Screen NONE DETECTED NONE DETECTED   Cocaine Metabolite,Ur [place] NONE DETECTED NONE DETECTED   Opiate, Ur Screen NONE DETECTED NONE DETECTED   Phencyclidine (PCP) Ur S NONE DETECTED NONE DETECTED   Cannabinoid 50 Ng, Ur [place] NONE DETECTED NONE DETECTED   Barbiturates, Ur Screen NONE DETECTED NONE DETECTED   Benzodiazepine, Ur Scrn NONE DETECTED NONE DETECTED   Methadone Scn, Ur NONE DETECTED NONE DETECTED    Comment: (NOTE) Tricyclics + metabolites, urine    Cutoff 1000 ng/mL Amphetamines + metabolites, urine  Cutoff 1000 ng/mL MDMA (Ecstasy), urine              Cutoff 500 ng/mL Cocaine Metabolite, urine          Cutoff 300 ng/mL Opiate + metabolites, urine        Cutoff 300 ng/mL Phencyclidine (PCP), urine         Cutoff 25 ng/mL Cannabinoid, urine                 Cutoff 50 ng/mL Barbiturates + metabolites, urine  Cutoff 200 ng/mL Benzodiazepine, urine              Cutoff 200 ng/mL Methadone, urine                    Cutoff 300 ng/mL  The urine drug screen provides only a preliminary, unconfirmed analytical test result and should not be used for non-medical purposes. Clinical consideration and professional judgment should be applied to any positive drug screen result due to possible interfering substances. A more specific alternate chemical method must be used in order to obtain a confirmed analytical result. Gas chromatography / mass spectrometry (GC/MS) is the preferred confirm atory method. Performed at [facility], [address] [place], [place] [address]   Urinalysis, Routine w reflex microscopic -Urine, Clean Catch     Status: Abnormal   Collection Time: 02/08/24  5:36 PM  Result Value Ref Range   Color, Urine STRAW (A) YELLOW   APPearance CLEAR (A) CLEAR   Specific Gravity, Urine 1.005 1.005 - 1.030   pH 7.0 5.0 - 8.0   Glucose, UA NEGATIVE NEGATIVE mg/dL   Hgb urine dipstick NEGATIVE NEGATIVE   Bilirubin Urine NEGATIVE NEGATIVE   Ketones, ur NEGATIVE NEGATIVE mg/dL   Protein, ur NEGATIVE NEGATIVE mg/dL   Nitrite NEGATIVE NEGATIVE   Leukocytes,Ua NEGATIVE NEGATIVE    Comment: Performed at [facility], [address] [place], [place] [address]    Blood Alcohol level:  No results found for: [facility]  Metabolic Disorder Labs:  Lab Results  Component Value Date   HGBA1C 5.9 (H) 07/12/2018   MPG 122.63 07/12/2018   No results found for: PROLACTIN Lab Results  Component Value Date   CHOL 128 07/12/2018   TRIG 55 07/12/2018   HDL 42 07/12/2018   CHOLHDL 3.0 07/12/2018   VLDL 11 07/12/2018   LDLCALC 75 07/12/2018    Current Medications: Current Facility-Administered Medications  Medication Dose Route Frequency Provider Last Rate Last Admin   acetaminophen  (TYLENOL ) tablet 650 mg  650 mg Oral Q6H PRN Onuoha, Chinwendu V, NP       alum & mag hydroxide-simeth (MAALOX/MYLANTA) 200-200-20 MG/5ML suspension 15 mL  15 mL Oral Q6H PRN Onuoha, Chinwendu V,  NP       feeding supplement (ENSURE PLUS HIGH PROTEIN) liquid 237 mL  237 mL Oral TID BM Darvis Croft, MD   237 mL at 02/09/24 1400   magnesium  hydroxide (MILK OF MAGNESIA) suspension 15 mL  15 mL Oral Daily PRN Onuoha, Chinwendu V, NP       multivitamin with minerals tablet 1 tablet  1 tablet Oral Daily Jaecob Lowden, MD   1 tablet at 02/09/24 0950   OLANZapine  (ZYPREXA ) injection 5 mg  5 mg Intramuscular TID PRN Onuoha, Chinwendu V, NP       OLANZapine  zydis (ZYPREXA ) disintegrating tablet 5 mg  5 mg Oral TID PRN Onuoha, Chinwendu V, NP   5 mg at 02/09/24 0026   PTA Medications: Medications Prior to Admission  Medication Sig Dispense Refill Last Dose/Taking   albuterol  (VENTOLIN  HFA) 108 (90 Base) MCG/ACT inhaler Inhale into the lungs. (Patient not taking: Reported on 12/10/2023)      aspirin  81 MG chewable tablet Chew 81 mg by mouth at bedtime. (Patient not taking: Reported on 12/10/2023)      clopidogrel  (PLAVIX ) 75 MG tablet Take 75 mg by mouth daily. (Patient not taking: Reported on 06/03/2023)      guaiFENesin -dextromethorphan  (ROBITUSSIN DM) 100-10 MG/5ML syrup Take  10 mLs by mouth every 4 (four) hours as needed for cough. (Patient not taking: Reported on 12/10/2023) 118 mL 0    montelukast (SINGULAIR) 10 MG tablet Take 1 tablet by mouth at bedtime. (Patient not taking: Reported on 12/10/2023)      Multiple Vitamin (MULTI-VITAMINS) TABS Take 1 tablet by mouth daily.      nitroGLYCERIN (NITROSTAT) 0.4 MG SL tablet Place under the tongue. Place 1 tablet (0.4 mg total) under the tongue every 5 (five) minutes as needed for Chest pain May take up to 3 doses.      torsemide (DEMADEX) 20 MG tablet Take 20 mg by mouth daily. (Patient not taking: Reported on 12/10/2023)      traZODone (DESYREL) 50 MG tablet Take 1 tablet by mouth at bedtime. (Patient not taking: Reported on 06/03/2023)      TRELEGY ELLIPTA 100-62.5-25 MCG/ACT AEPB Inhale into the lungs. (Patient not taking: Reported on 12/10/2023)       vitamin B-12 (CYANOCOBALAMIN ) 500 MCG tablet Take 500 mcg by mouth daily. (Patient not taking: Reported on 06/03/2023)       Psychiatric Specialty Exam:  Presentation  General Appearance:  Appropriate for Environment; Casual  Eye Contact: Fair  Speech: Clear and Coherent  Speech Volume: Normal    Mood and Affect  Mood: Depressed  Affect: Depressed; Flat   Thought Process  Thought Processes: Coherent  Descriptions of Associations:Intact  Orientation:Partial  Thought Content:Logical  Hallucinations:Hallucinations: None  Ideas of Reference:None  Suicidal Thoughts:Suicidal Thoughts: No  Homicidal Thoughts:Homicidal Thoughts: No   Sensorium  Memory: Immediate Fair; Recent Poor; Remote Poor  Judgment: Impaired  Insight: Shallow   Executive Functions  Concentration: Fair  Attention Span: Fair  Recall: Poor  Fund of Knowledge: Fair  Language: Fair   Psychomotor Activity  Psychomotor Activity: Psychomotor Activity: Normal   Assets  Assets: Communication Skills; Desire for Improvement; Resilience; Social Support    Musculoskeletal: Strength & Muscle Tone: decreased Gait & Station: unsteady  Physical Exam: Physical Exam Vitals and nursing note reviewed.  HENT:     Head: Normocephalic.  Cardiovascular:     Rate and Rhythm: Normal rate.  Neurological:     Mental Status: He is alert.    Review of Systems  Constitutional: Negative.   HENT: Negative.    Eyes: Negative.   Skin: Negative.    Blood pressure (!) 84/52, pulse 77, temperature 97.8 F (36.6 C), resp. rate 18, height 5' 10 (1.778 m), weight 74.4 kg, SpO2 100%. Body mass index is 23.53 kg/m.  Principal Diagnosis: MDD (major depressive disorder), recurrent episode, severe (HCC) Diagnosis:  Principal Problem:   MDD (major depressive disorder), recurrent episode, severe (HCC)   Clinical Decision Making:Patient with hx of depression admitted for making suicidal  thoughts with no plan or intent. Recent stressors of selling out his home furniture, adjusting to new placement probably triggered the mood problems. He will be monitored closely.  Treatment Plan Summary:  Safety and Monitoring:             -- Voluntary admission to inpatient psychiatric unit for safety, stabilization and treatment             -- Daily contact with patient to assess and evaluate symptoms and progress in treatment             -- Patient's case to be discussed in multi-disciplinary team meeting             -- Observation Level: q15 minute checks             --  Vital signs:  q12 hours             -- Precautions: suicide, elopement, and assault   2. Psychiatric Diagnoses and Treatment:              Zoloft  25 mg daily will be initiated     -- The risks/benefits/side-effects/alternatives to this medication were discussed in detail with the patient and time was given for questions. The patient consents to medication trial.                -- Metabolic profile and EKG monitoring obtained while on an atypical antipsychotic (BMI: Lipid Panel: HbgA1c: QTc:)              -- Encouraged patient to participate in unit milieu and in scheduled group therapies                            3. Medical Issues Being Addressed:   No urgent Medical needs    4. Discharge Planning:              -- Social work and case management to assist with discharge planning and identification of hospital follow-up needs prior to discharge             -- Estimated LOS: 5-7 days             -- Discharge Concerns: Need to establish a safety plan; Medication compliance and effectiveness             -- Discharge Goals: Return home with outpatient referrals follow ups  Physician Treatment Plan for Primary Diagnosis: MDD (major depressive disorder), recurrent episode, severe (HCC) Long Term Goal(s): Improvement in symptoms so as ready for discharge  Short Term Goals: Ability to identify changes in lifestyle to  reduce recurrence of condition will improve, Ability to verbalize feelings will improve, Ability to disclose and discuss suicidal ideas, Ability to demonstrate self-control will improve, and Ability to identify and develop effective coping behaviors will improve  Physician Treatment Plan for Secondary Diagnosis: Principal Problem:   MDD (major depressive disorder), recurrent episode, severe (HCC)  Long Term Goal(s): Improvement in symptoms so as ready for discharge  Short Term Goals: Ability to identify changes in lifestyle to reduce recurrence of condition will improve, Ability to verbalize feelings will improve, Ability to disclose and discuss suicidal ideas, Ability to demonstrate self-control will improve, and Ability to identify and develop effective coping behaviors will improve  I certify that inpatient services furnished can reasonably be expected to improve the patient's condition.    Ashika Apuzzo, MD 7/9/20258:33 PM

## 2024-02-09 NOTE — Group Note (Signed)
 Date:  02/09/2024 Time:  11:24 AM  Group Topic/Focus:  Emotional Education:   The focus of this group is to discuss what feelings/emotions are, and how they are experienced.    Participation Level:  Active  Participation Quality:  Appropriate  Affect:  Appropriate  Cognitive:  Appropriate  Insight: Appropriate  Engagement in Group:  Engaged  Modes of Intervention:  Discussion   Elijah Collins 02/09/2024, 11:24 AM

## 2024-02-09 NOTE — Progress Notes (Signed)
   02/09/24 2200  Psych Admission Type (Psych Patients Only)  Admission Status Voluntary  Psychosocial Assessment  Patient Complaints Worrying  Eye Contact Fair  Facial Expression Worried  Affect Preoccupied  Speech Logical/coherent  Interaction Assertive  Motor Activity Slow  Appearance/Hygiene Unremarkable  Behavior Characteristics Cooperative  Mood Depressed  Thought Process  Coherency WDL  Content WDL  Delusions None reported or observed  Perception WDL  Hallucination None reported or observed  Judgment Impaired  Confusion Mild  Danger to Self  Current suicidal ideation? Denies  Agreement Not to Harm Self Yes  Description of Agreement verbal  Danger to Others  Danger to Others None reported or observed

## 2024-02-09 NOTE — Plan of Care (Signed)
  Problem: Self-Concept: Goal: Ability to identify factors that promote anxiety will improve Outcome: Progressing Goal: Level of anxiety will decrease Outcome: Progressing Goal: Ability to modify response to factors that promote anxiety will improve Outcome: Progressing   

## 2024-02-10 DIAGNOSIS — F332 Major depressive disorder, recurrent severe without psychotic features: Secondary | ICD-10-CM | POA: Diagnosis not present

## 2024-02-10 NOTE — Group Note (Signed)
 Recreation Therapy Group Note   Group Topic:Emotion Expression  Group Date: 02/10/2024 Start Time: 1500 End Time: 1550 Facilitators: Celestia Jeoffrey BRAVO, LRT, CTRS Location: Dayroom  Group Description: Painting a [employer] operational officer. Patients and LRT discuss what it means to be "at peace", what it feels like physically and mentally. Pts are given a canvas and watercolor paint to use and encouraged to draw their idea of a peaceful place. Pts and LRT discuss how they use this in their daily life post discharge. Pts are encouraged to take their canvas home with them as a reminder to find their peaceful place whenever they are feeling depressed, anxious, etc.    Goal Area(s) Addressed:  Patient will identify what it means to experience a "peaceful" emotion. Patient will identify a new coping skill.  Patient will express their emotions through art. Patients will increase communication by talking with LRT and peers while in group.   Affect/Mood: N/A   Participation Level: Did not attend    Clinical Observations/Individualized Feedback: Patient did not attend group.   Plan: Continue to engage patient in RT group sessions 2-3x/week.   Jeoffrey BRAVO Celestia, LRT, CTRS 02/10/2024 4:57 PM

## 2024-02-10 NOTE — Plan of Care (Signed)
  Problem: Education: Goal: Ability to state activities that reduce stress will improve Outcome: Progressing   Problem: Coping: Goal: Ability to identify and develop effective coping behavior will improve Outcome: Progressing   Problem: Self-Concept: Goal: Ability to identify factors that promote anxiety will improve Outcome: Progressing Goal: Level of anxiety will decrease Outcome: Progressing Goal: Ability to modify response to factors that promote anxiety will improve Outcome: Progressing   Problem: Education: Goal: Utilization of techniques to improve thought processes will improve Outcome: Progressing Goal: Knowledge of the prescribed therapeutic regimen will improve Outcome: Progressing   Problem: Activity: Goal: Interest or engagement in leisure activities will improve Outcome: Progressing Goal: Imbalance in normal sleep/wake cycle will improve Outcome: Progressing   Problem: Coping: Goal: Coping ability will improve Outcome: Progressing Goal: Will verbalize feelings Outcome: Progressing   Problem: Health Behavior/Discharge Planning: Goal: Ability to make decisions will improve Outcome: Progressing Goal: Compliance with therapeutic regimen will improve Outcome: Progressing   Problem: Role Relationship: Goal: Will demonstrate positive changes in social behaviors and relationships Outcome: Progressing   Problem: Safety: Goal: Ability to disclose and discuss suicidal ideas will improve Outcome: Progressing Goal: Ability to identify and utilize support systems that promote safety will improve Outcome: Progressing   Problem: Self-Concept: Goal: Will verbalize positive feelings about self Outcome: Progressing Goal: Level of anxiety will decrease Outcome: Progressing   Problem: Education: Goal: Knowledge of [facility] Education information/materials will improve Outcome: Progressing Goal: Emotional status will improve Outcome: Progressing Goal:  Mental status will improve Outcome: Progressing Goal: Verbalization of understanding the information provided will improve Outcome: Progressing   Problem: Activity: Goal: Interest or engagement in activities will improve Outcome: Progressing Goal: Sleeping patterns will improve Outcome: Progressing   Problem: Coping: Goal: Ability to verbalize frustrations and anger appropriately will improve Outcome: Progressing Goal: Ability to demonstrate self-control will improve Outcome: Progressing   Problem: Health Behavior/Discharge Planning: Goal: Identification of resources available to assist in meeting health care needs will improve Outcome: Progressing Goal: Compliance with treatment plan for underlying cause of condition will improve Outcome: Progressing   Problem: Physical Regulation: Goal: Ability to maintain clinical measurements within normal limits will improve Outcome: Progressing   Problem: Safety: Goal: Periods of time without injury will increase Outcome: Progressing   

## 2024-02-10 NOTE — Progress Notes (Signed)
 [facility] MD Progress Note  02/10/2024 12:49 PM Elijah Collins Pinal Sr.  MRN:  [id number]  [name] is a 89 y.o. male with past medical history of anemia, asthma, MGUS, CKD, and stroke who presents to the ED complaining of suicidal ideation. Patient reports that he has been feeling increasingly depressed recently after having to sell off his home and all of his belongings in order to move into a nursing facility. He states that he is obsessed with not being here anymore and has been thinking about harming himself. He denies any specific plan at this time, has been taking medications as prescribed Patient is admitted to [facility] psych unit with Q15 min safety monitoring. Multidisciplinary team approach is offered. Medication management; group/milieu therapy is offered.  Subjective:  Chart reviewed, case discussed in multidisciplinary meeting, patient seen during rounds.  Patient is noted to be sitting in the hallway.  He reports that he is very upset being in the hospital.  He continues to say that he sees no purpose on goal of staying in the hospital when he could go home.  He talks about his daughter and his nephew both being his support and constantly monitoring him.  He denies SI/HI/plan and denies hallucinations.  He is noted to be more clear minded today.  Provider educated about the Zoloft  and to report any side effects like headaches, nausea vomiting or any worsening suicidal ideation.  Patient expressed his understanding.  He denies any auditory/visual hallucinations.   Sleep: Fair  Appetite:  Fair  Past Psychiatric History: see h&P Family History:  Family History  Problem Relation Age of Onset   CVA Mother    COPD Father    Social History:  Social History   Substance and Sexual Activity  Alcohol Use No     Social History   Substance and Sexual Activity  Drug Use No    Social History   Socioeconomic History   Marital status: Widowed    Spouse name: Not on file   Number of  children: Not on file   Years of education: Not on file   Highest education level: Not on file  Occupational History   Not on file  Tobacco Use   Smoking status: Never   Smokeless tobacco: Never  Vaping Use   Vaping status: Never Used  Substance and Sexual Activity   Alcohol use: No   Drug use: No   Sexual activity: Not Currently  Other Topics Concern   Not on file  Social History Narrative   Not on file   Social Drivers of Health   Financial Resource Strain: Low Risk  (10/21/2023)   Received from [facility]   Overall Financial Resource Strain (CARDIA)    Difficulty of Paying Living Expenses: Not hard at all  Food Insecurity: No Food Insecurity (02/08/2024)   Hunger Vital Sign    Worried About Running Out of Food in the Last Year: Never true    Ran Out of Food in the Last Year: Never true  Transportation Needs: No Transportation Needs (02/08/2024)   PRAPARE - Administrator, Civil Service (Medical): No    Lack of Transportation (Non-Medical): No  Physical Activity: Sufficiently Active (06/26/2019)   Exercise Vital Sign    Days of Exercise per Week: 7 days    Minutes of Exercise per Session: 60 min  Stress: No Stress Concern Present (06/26/2019)   [employer] of Occupational Health - Occupational Stress Questionnaire  Feeling of Stress : Only a little  Social Connections: Moderately Isolated (02/08/2024)   Social Connection and Isolation Panel    Frequency of Communication with Friends and Family: Three times a week    Frequency of Social Gatherings with Friends and Family: Patient declined    Attends Religious Services: Patient declined    Active Member of Clubs or Organizations: No    Attends Engineer, structural: 1 to 4 times per year    Marital Status: Widowed   Past Medical History:  Past Medical History:  Diagnosis Date   Asthma    Cardiac arrhythmia    CHF (congestive heart failure) (HCC)    Hyperlipidemia     Shortness of breath    Stroke (HCC)    TIA (transient ischemic attack)     Past Surgical History:  Procedure Laterality Date   APPENDECTOMY  2011   [facility]   HERNIA REPAIR Right 10 years ago   [facility]  [facility]   PACEMAKER IMPLANT     PACEMAKER INSERTION Left 07/11/2019   Procedure: PACEMAKER CHANGEOUT;  Surgeon: Ammon Blunt, MD;  Location: [facility] ORS;  Service: Cardiovascular;  Laterality: Left;    Current Medications: Current Facility-Administered Medications  Medication Dose Route Frequency Provider Last Rate Last Admin   acetaminophen  (TYLENOL ) tablet 650 mg  650 mg Oral Q6H PRN Onuoha, Chinwendu V, NP       alum & mag hydroxide-simeth (MAALOX/MYLANTA) 200-200-20 MG/5ML suspension 15 mL  15 mL Oral Q6H PRN Onuoha, Chinwendu V, NP       feeding supplement (ENSURE PLUS HIGH PROTEIN) liquid 237 mL  237 mL Oral TID BM Veida Spira, MD   237 mL at 02/10/24 9076   magnesium  hydroxide (MILK OF MAGNESIA) suspension 15 mL  15 mL Oral Daily PRN Onuoha, Chinwendu V, NP       multivitamin with minerals tablet 1 tablet  1 tablet Oral Daily Mekisha Bittel, MD   1 tablet at 02/10/24 0920   OLANZapine  (ZYPREXA ) injection 5 mg  5 mg Intramuscular TID PRN Onuoha, Chinwendu V, NP       OLANZapine  zydis (ZYPREXA ) disintegrating tablet 5 mg  5 mg Oral TID PRN Onuoha, Chinwendu V, NP   5 mg at 02/09/24 0026   sertraline  (ZOLOFT ) tablet 25 mg  25 mg Oral Daily Dehlia Kilner, MD   25 mg at 02/10/24 9079    Lab Results:  Results for orders placed or performed during the hospital encounter of 02/08/24 (from the past 48 hours)  SARS Coronavirus 2 by RT PCR (hospital order, performed in [facility] hospital lab) *cepheid single result test* Anterior Nasal Swab     Status: None   Collection Time: 02/08/24  5:21 PM   Specimen: Anterior Nasal Swab  Result Value Ref Range   SARS Coronavirus 2 by RT PCR NEGATIVE NEGATIVE    Comment: (NOTE) SARS-CoV-2 target nucleic acids are NOT DETECTED.  The SARS-CoV-2  RNA is generally detectable in upper and lower respiratory specimens during the acute phase of infection. The lowest concentration of SARS-CoV-2 viral copies this assay can detect is 250 copies / mL. A negative result does not preclude SARS-CoV-2 infection and should not be used as the sole basis for treatment or other patient management decisions.  A negative result may occur with improper specimen collection / handling, submission of specimen other than nasopharyngeal swab, presence of viral mutation(s) within the areas targeted by this assay, and inadequate number of viral copies (<250 copies / mL).  A negative result must be combined with clinical observations, patient history, and epidemiological information.  Fact Sheet for Patients:   RoadLapTop.co.za  Fact Sheet for Healthcare Providers: http://kim-Willet.com/  This test is not yet approved or  cleared by the [place]  FDA and has been authorized for detection and/or diagnosis of SARS-CoV-2 by FDA under an Emergency Use Authorization (EUA).  This EUA will remain in effect (meaning this test can be used) for the duration of the COVID-19 declaration under Section 564(b)(1) of the Act, 21 U.S.C. section 360bbb-3(b)(1), unless the authorization is terminated or revoked sooner.  Performed at [facility], [address] [place], [place] [address]   Urine Drug Screen, Qualitative     Status: None   Collection Time: 02/08/24  5:36 PM  Result Value Ref Range   Tricyclic, Ur Screen NONE DETECTED NONE DETECTED   Amphetamines, Ur Screen NONE DETECTED NONE DETECTED   MDMA (Ecstasy)Ur Screen NONE DETECTED NONE DETECTED   Cocaine Metabolite,Ur [place] NONE DETECTED NONE DETECTED   Opiate, Ur Screen NONE DETECTED NONE DETECTED   Phencyclidine (PCP) Ur S NONE DETECTED NONE DETECTED   Cannabinoid 50 Ng, Ur  NONE DETECTED NONE DETECTED   Barbiturates, Ur Screen NONE DETECTED NONE  DETECTED   Benzodiazepine, Ur Scrn NONE DETECTED NONE DETECTED   Methadone Scn, Ur NONE DETECTED NONE DETECTED    Comment: (NOTE) Tricyclics + metabolites, urine    Cutoff 1000 ng/mL Amphetamines + metabolites, urine  Cutoff 1000 ng/mL MDMA (Ecstasy), urine              Cutoff 500 ng/mL Cocaine Metabolite, urine          Cutoff 300 ng/mL Opiate + metabolites, urine        Cutoff 300 ng/mL Phencyclidine (PCP), urine         Cutoff 25 ng/mL Cannabinoid, urine                 Cutoff 50 ng/mL Barbiturates + metabolites, urine  Cutoff 200 ng/mL Benzodiazepine, urine              Cutoff 200 ng/mL Methadone, urine                   Cutoff 300 ng/mL  The urine drug screen provides only a preliminary, unconfirmed analytical test result and should not be used for non-medical purposes. Clinical consideration and professional judgment should be applied to any positive drug screen result due to possible interfering substances. A more specific alternate chemical method must be used in order to obtain a confirmed analytical result. Gas chromatography / mass spectrometry (GC/MS) is the preferred confirm atory method. Performed at [facility], [address] [place], [place] [address]   Urinalysis, Routine w reflex microscopic -Urine, Clean Catch     Status: Abnormal   Collection Time: 02/08/24  5:36 PM  Result Value Ref Range   Color, Urine STRAW (A) YELLOW   APPearance CLEAR (A) CLEAR   Specific Gravity, Urine 1.005 1.005 - 1.030   pH 7.0 5.0 - 8.0   Glucose, UA NEGATIVE NEGATIVE mg/dL   Hgb urine dipstick NEGATIVE NEGATIVE   Bilirubin Urine NEGATIVE NEGATIVE   Ketones, ur NEGATIVE NEGATIVE mg/dL   Protein, ur NEGATIVE NEGATIVE mg/dL   Nitrite NEGATIVE NEGATIVE   Leukocytes,Ua NEGATIVE NEGATIVE    Comment: Performed at [facility], [address] [place], [place] [address]    Blood Alcohol level:  No results found for: [facility]  Metabolic  Disorder Labs: Lab  Results  Component Value Date   HGBA1C 5.9 (H) 07/12/2018   MPG 122.63 07/12/2018   No results found for: PROLACTIN Lab Results  Component Value Date   CHOL 128 07/12/2018   TRIG 55 07/12/2018   HDL 42 07/12/2018   CHOLHDL 3.0 07/12/2018   VLDL 11 07/12/2018   LDLCALC 75 07/12/2018    Physical Findings: AIMS:  , ,  ,  ,    CIWA:    COWS:      Psychiatric Specialty Exam:  Presentation  General Appearance:  Appropriate for Environment; Casual  Eye Contact: Fair  Speech: Clear and Coherent  Speech Volume: Normal    Mood and Affect  Mood: Depressed  Affect: Depressed; Flat   Thought Process  Thought Processes: Coherent  Descriptions of Associations:Intact  Orientation:Partial  Thought Content:Logical  Hallucinations:Hallucinations: None  Ideas of Reference:None  Suicidal Thoughts:Suicidal Thoughts: No  Homicidal Thoughts:Homicidal Thoughts: No   Sensorium  Memory: Immediate Fair; Recent Poor; Remote Poor  Judgment: Impaired  Insight: Shallow   Executive Functions  Concentration: Fair  Attention Span: Fair  Recall: Poor  Fund of Knowledge: Fair  Language: Fair   Psychomotor Activity  Psychomotor Activity: Psychomotor Activity: Normal  Musculoskeletal: Strength & Muscle Tone: within normal limits Gait & Station: normal Assets  Assets: Manufacturing systems engineer; Desire for Improvement; Resilience; Social Support    Physical Exam: Physical Exam Vitals and nursing note reviewed.  HENT:     Head: Normocephalic.  Neurological:     Mental Status: He is alert.    ROS Blood pressure 108/62, pulse 76, temperature (!) 97.5 F (36.4 C), resp. rate 18, height 5' 10 (1.778 m), weight 74.4 kg, SpO2 99%. Body mass index is 23.53 kg/m.  Diagnosis: Principal Problem:   MDD (major depressive disorder), recurrent episode, severe (HCC)  Clinical Decision Making:Patient with hx of depression admitted for making suicidal  thoughts with no plan or intent. Recent stressors of selling out his home furniture, adjusting to new placement probably triggered the mood problems. He will be monitored closely.   Treatment Plan Summary:   Safety and Monitoring:             -- Voluntary admission to inpatient psychiatric unit for safety, stabilization and treatment             -- Daily contact with patient to assess and evaluate symptoms and progress in treatment             -- Patient's case to be discussed in multi-disciplinary team meeting             -- Observation Level: q15 minute checks             -- Vital signs:  q12 hours             -- Precautions: suicide, elopement, and assault   2. Psychiatric Diagnoses and Treatment:              Zoloft  25 mg daily -will monitor for any side effects     -- The risks/benefits/side-effects/alternatives to this medication were discussed in detail with the patient and time was given for questions. The patient consents to medication trial.                -- Metabolic profile and EKG monitoring obtained while on an atypical antipsychotic (BMI: Lipid Panel: HbgA1c: QTc:)              -- Encouraged patient to participate  in unit milieu and in scheduled group therapies                            3. Medical Issues Being Addressed:   No urgent Medical needs      4. Discharge Planning:   -- Social work and case management to assist with discharge planning and identification of hospital follow-up needs prior to discharge  -- Estimated LOS: 3-4 days  Allyn Foil, MD 02/10/2024, 12:49 PM

## 2024-02-10 NOTE — Group Note (Signed)
 [facility] LCSW Group Therapy Note   Group Date: 02/10/2024 Start Time: 1300 End Time: 1345   Type of Therapy/Topic:  Group Therapy:  Emotion Regulation  Participation Level:  None   Mood:  Description of Group:    The purpose of this group is to assist patients in learning to regulate negative emotions and experience positive emotions. Patients will be guided to discuss ways in which they have been vulnerable to their negative emotions. These vulnerabilities will be juxtaposed with experiences of positive emotions or situations, and patients challenged to use positive emotions to combat negative ones. Special emphasis will be placed on coping with negative emotions in conflict situations, and patients will process healthy conflict resolution skills.  Therapeutic Goals: Patient will identify two positive emotions or experiences to reflect on in order to balance out negative emotions:  Patient will label two or more emotions that they find the most difficult to experience:  Patient will be able to demonstrate positive conflict resolution skills through discussion or role plays:   Summary of Patient Progress:   Pt read silently in the corner during group time     Therapeutic Modalities:   Cognitive Behavioral Therapy Feelings Identification Dialectical Behavioral Therapy   Elijah Collins, LCSWA

## 2024-02-10 NOTE — Plan of Care (Signed)
 Patient alert and oriented x 3. Denies SI, HI, AVH and pain. Scheduled medications per MAR. Support and encouragement provided.  Routine safety checks conducted every 15 minutes.  Patient informed to notify staff with problems or concerns. No adverse drug reactions noted. Patient verbally contracts for safety at this time. Patient interacts well with others on the unit.  Patient remains safe at this time.  Problem: Education: Goal: Ability to state activities that reduce stress will improve Outcome: Progressing   Problem: Self-Concept: Goal: Ability to identify factors that promote anxiety will improve Outcome: Progressing Goal: Level of anxiety will decrease Outcome: Progressing Goal: Ability to modify response to factors that promote anxiety will improve Outcome: Progressing   Problem: Education: Goal: Utilization of techniques to improve thought processes will improve Outcome: Progressing Goal: Knowledge of the prescribed therapeutic regimen will improve Outcome: Progressing   Problem: Health Behavior/Discharge Planning: Goal: Ability to make decisions will improve Outcome: Progressing Goal: Compliance with therapeutic regimen will improve Outcome: Progressing

## 2024-02-10 NOTE — Group Note (Signed)
 Date:  02/10/2024 Time:  8:32 PM  Group Topic/Focus:  Goals Group:   The focus of this group is to help patients establish daily goals to achieve during treatment and discuss how the patient can incorporate goal setting into their daily lives to aide in recovery.    Participation Level:  Active  Participation Quality:  Appropriate  Affect:  Appropriate  Cognitive:  Appropriate  Insight: Appropriate  Engagement in Group:  Engaged  Modes of Intervention:  Discussion  Additional Comments:    Elijah Collins 02/10/2024, 8:32 PM

## 2024-02-10 NOTE — Group Note (Signed)
 Date:  02/10/2024 Time:  10:38 AM  Group Topic/Focus:  Movement Therapy    Participation Level:  Did Not Attend   Elijah Collins 02/10/2024, 10:38 AM

## 2024-02-10 NOTE — Group Note (Signed)
 Recreation Therapy Group Note   Group Topic:General Recreation  Group Date: 02/10/2024 Start Time: 1100 End Time: 1135 Facilitators: Celestia Jeoffrey BRAVO, LRT, CTRS Location: Courtyard  Group Description: Outdoor Recreation. Patients had the option to play corn hole, ring toss, bowling or listening to music while outside in the courtyard getting fresh air and sunlight. Patients helped water and prune the raised garden beds. LRT and patients discussed things that they enjoy doing in their free time outside of the hospital. LRT encouraged patients to drink water after being active and getting their heart rate up.   Goal Area(s) Addressed: Patient will identify leisure interests.  Patient will practice healthy decision making. Patient will engage in recreation activity.   Affect/Mood: N/A   Participation Level: Did not attend    Clinical Observations/Individualized Feedback: Patient did not attend group.   Plan: Continue to engage patient in RT group sessions 2-3x/week.   Jeoffrey BRAVO Celestia, LRT, CTRS 02/10/2024 2:08 PM

## 2024-02-11 MED ORDER — SERTRALINE HCL 25 MG PO TABS: 25.0000 mg | ORAL_TABLET | Freq: Every day | ORAL | 0 refills | Status: AC

## 2024-02-11 NOTE — Progress Notes (Signed)
  [facility] Adult Case Management Discharge Plan :  Will you be returning to the same living situation after discharge:  Yes,  pt will return home  At discharge, do you have transportation home?: Yes,  pt's daughter will pick him up  Do you have the ability to pay for your medications: Yes,  [employer] MEDICARE / [facility] MEDICARE  Release of information consent forms completed and in the chart;  Patient's signature needed at discharge.  Patient to Follow up at:  Follow-up Information     [facility], [facility]. Go to.   Why: Although you declined scheduled follow-up, I am putting walk-in hours, [facility] provides both Same-Day Access and Walk-In Crisis services. Same-Day Access means they will see you and connect you to appropriate services the same day, while Walk-In Crisis services are for immediate needs and will direct you to the relevant crisis service. Both are available during daytime business hours, typically 8 am to 5 pm Contact information: [address] [place] [address] [phone]         [facility] Follow up.   Specialty: Urgent Care Why: [facility] provides timely access to mental health services for children and adolescents (ages 66-17) and adults presenting in a mental health crisis. The program is designed for those who need urgent behavioral health or substance use treatment and are not experiencing a medical crisis that would typically require an emergency room visit. Contact information: [address] [place]  [phone]                Next level of care provider has access to [facility] Link:no  Safety Planning and Suicide Prevention discussed: Chaney Sharyne Gilding, daughter, [phone]     Has patient been referred to the Quitline?: Patient does not use tobacco/nicotine products  Patient has been referred for addiction treatment: No known  substance use disorder.  [address], LCSWA 02/11/2024, 9:52 AM

## 2024-02-11 NOTE — [facility] Suicide Risk Assessment (Signed)
 [facility] INPATIENT:  Family/Significant Other Suicide Prevention Education  Suicide Prevention Education:  Education Completed; Chaney Sharyne Gilding, daughter, [phone],  (name of family member/significant other) has been identified by the patient as the family member/significant other with whom the patient will be residing, and identified as the person(s) who will aid the patient in the event of a mental health crisis (suicidal ideations/suicide attempt).  With written consent from the patient, the family member/significant other has been provided the following suicide prevention education, prior to the and/or following the discharge of the patient.  The suicide prevention education provided includes the following: Suicide risk factors Suicide prevention and interventions National Suicide Hotline telephone number [facility] assessment telephone number [facility] Emergency Assistance 911 [facility] and/or Residential Mobile Crisis Unit telephone number  Request made of family/significant other to: Remove weapons (e.g., guns, rifles, knives), all items previously/currently identified as safety concern.   Remove drugs/medications (over-the-counter, prescriptions, illicit drugs), all items previously/currently identified as a safety concern.  The family member/significant other verbalizes understanding of the suicide prevention education information provided.  The family member/significant other agrees to remove the items of safety concern listed above.  Elijah Collins 02/11/2024, 9:53 AM

## 2024-02-11 NOTE — Progress Notes (Signed)
   02/11/24 0400  Psych Admission Type (Psych Patients Only)  Admission Status Voluntary  Psychosocial Assessment  Patient Complaints Worrying;Other (Comment) (They took my money)  Biomedical scientist  Facial Expression Worried  Affect Preoccupied  Speech Logical/coherent  Interaction Assertive  Motor Activity Slow  Appearance/Hygiene Unremarkable  Behavior Characteristics Cooperative;Agitated  Mood Irritable;Anxious  Aggressive Behavior  Effect No apparent injury  Thought Process  Coherency WDL  Content Preoccupation;Other (Comment) (They took my money)  Delusions None reported or observed  Perception WDL  Hallucination None reported or observed  Judgment Impaired  Confusion Mild  Danger to Self  Current suicidal ideation? Denies  Self-Injurious Behavior No self-injurious ideation or behavior indicators observed or expressed   Agreement Not to Harm Self Yes  Description of Agreement Verbal  Danger to Others  Danger to Others None reported or observed   Pt alert & able to make needs known.  Denies SI/HI/AVH at this time.  Sleeping intermittently throughout the night.  Increased irritability in the morning stating, they took my money on admission.  Pt able to be redirected w/o incident.  Returned to bed @ 5am and remains in room at this time.  Q67min safety checks remain in place.

## 2024-02-11 NOTE — Care Management Important Message (Signed)
 Important Message  Patient Details  Name: Elijah HARR Sr. MRN: [id number] Date of Birth: March 24, 1925   Important Message Given:  Yes - Medicare IM     Lum JONETTA Croft, LCSWA 02/11/2024, 9:54 AM

## 2024-02-11 NOTE — [facility] Suicide Risk Assessment (Signed)
 [facility] Discharge Suicide Risk Assessment   Principal Problem: MDD (major depressive disorder), recurrent episode, severe (HCC) Discharge Diagnoses: Principal Problem:   MDD (major depressive disorder), recurrent episode, severe (HCC)   Total Time spent with patient: 30 minutes  Musculoskeletal: Strength & Muscle Tone: within normal limits Gait & Station: unsteady Patient leans: N/A  Psychiatric Specialty Exam  Presentation  General Appearance:  Appropriate for Environment; Casual  Eye Contact: Fair  Speech: Clear and Coherent  Speech Volume: Normal  Handedness: Right   Mood and Affect  Mood: Euthymic  Duration of Depression Symptoms: No data recorded Affect: Appropriate   Thought Process  Thought Processes: Coherent  Descriptions of Associations:Intact  Orientation:Partial  Thought Content:Logical  History of Schizophrenia/Schizoaffective disorder:No data recorded Duration of Psychotic Symptoms:No data recorded Hallucinations:Hallucinations: None  Ideas of Reference:None  Suicidal Thoughts:Suicidal Thoughts: No  Homicidal Thoughts:Homicidal Thoughts: No   Sensorium  Memory: Immediate Good; Immediate Fair; Remote Poor  Judgment: Fair  Insight: Fair   Chartered certified accountant: Fair  Attention Span: Fair  Recall: [employer] of Knowledge: Fair  Language: Fair   Psychomotor Activity  Psychomotor Activity: Psychomotor Activity: Normal   Assets  Assets: Communication Skills; Desire for Improvement; Social Support   Sleep  Sleep: Sleep: Fair  Estimated Sleeping Duration (Last 24 Hours): 9.00 hours  Physical Exam: Physical Exam ROS Blood pressure 102/62, pulse 77, temperature (!) 97.1 F (36.2 C), resp. rate 18, height 5' 10 (1.778 m), weight 74.4 kg, SpO2 98%. Body mass index is 23.53 kg/m.  Mental Status Per Nursing Assessment::   On Admission:  Suicidal ideation indicated by patient  Demographic  Factors:  Male  Loss Factors: Decrease in vocational status  Historical Factors: NA  Risk Reduction Factors:   Living with another person, especially a relative, Positive social support, Positive therapeutic relationship, and Positive coping skills or problem solving skills  Continued Clinical Symptoms:  Depression:   Insomnia  Cognitive Features That Contribute To Risk:  None    Suicide Risk:  Minimal: No identifiable suicidal ideation.  Patients presenting with no risk factors but with morbid ruminations; may be classified as minimal risk based on the severity of the depressive symptoms   Follow-up Information     [facility]. Go to.   Why: Although you declined scheduled follow-up, I am putting walk-in hours, [facility] provides both Same-Day Access and Walk-In Crisis services. Same-Day Access means they will see you and connect you to appropriate services the same day, while Walk-In Crisis services are for immediate needs and will direct you to the relevant crisis service. Both are available during daytime business hours, typically 8 am to 5 pm Contact information: [address] [place] [address] [phone]         [facility] Follow up.   Specialty: Urgent Care Why: [facility] provides timely access to mental health services for children and adolescents (ages 41-17) and adults presenting in a mental health crisis. The program is designed for those who need urgent behavioral health or substance use treatment and are not experiencing a medical crisis that would typically require an emergency room visit. Contact information: [address] [place]  [id number] [phone]                Plan Of Care/Follow-up recommendations:  Activity:  As tolearted  Allyn Foil, MD 02/11/2024, 12:22 PM

## 2024-02-11 NOTE — Plan of Care (Signed)
  Problem: Self-Concept: Goal: Level of anxiety will decrease Outcome: Progressing Goal: Ability to modify response to factors that promote anxiety will improve Outcome: Progressing   Problem: Education: Goal: Utilization of techniques to improve thought processes will improve Outcome: Progressing   

## 2024-02-11 NOTE — Plan of Care (Addendum)
 Patient is scheduled for discharge today with daughter to pick him up.  Patient is calm, cooperative, interacting well with peers and staff.  Will continue to monitor and arrange discharge. Patient taken in wheelchair to the exit for discharge with daughter driving.

## 2024-02-11 NOTE — Group Note (Signed)
 Date:  02/11/2024 Time:  11:03 AM  Group Topic/Focus:  Fresh air Therapy with music and conversation.    Participation Level:  Active  Participation Quality:  Appropriate  Affect:  Appropriate  Cognitive:  Appropriate  Insight: Appropriate  Engagement in Group:  Engaged  Modes of Intervention:  Socialization and music  Additional Comments:  none  Norleen SHAUNNA Bias 02/11/2024, 11:03 AM

## 2024-02-11 NOTE — [facility] Counselor (Signed)
 Adult Comprehensive Assessment  Patient ID: Elijah Collins., male   DOB: Dec 19, 1924, 89 y.o.   MRN: [id number]  Information Source: Information source: Patient  Current Stressors:  Patient states their primary concerns and needs for treatment are:: suicidal thoughts Patient states their goals for this hospitilization and ongoing recovery are:: None reported by pt Educational / Learning stressors: None reported Employment / Job issues: Pt reports he has been retired for 30 years Family Relationships: None reported Surveyor, quantity / Lack of resources (include bankruptcy): None reported Housing / Lack of housing: None reported Physical health (include injuries & life threatening diseases): None reported Social relationships: None reported Substance abuse: None reported Bereavement / Loss: Pt reports his wife died 5 years ago and reports 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  Living/Environment/Situation:  Living Arrangements: Alone Living conditions (as described by patient or guardian): Pt reports he moved to an assisted living apartment 2 months ago Who else lives in the home?: Pt reports he lives a lone How long has patient lived in current situation?: 2 months What is atmosphere in current home: Comfortable  Family History:  Marital status: Widowed Widowed, when?: 5 years ago Are you sexually active?: No What is your sexual orientation?: N/A Has your sexual activity been affected by drugs, alcohol, medication, or emotional stress?: N/A Does patient have children?: Yes How many children?: 3 How is patient's relationship with their children?: good  Childhood History:  By whom was/is the patient raised?: Both parents Additional childhood history information: None reported Description of patient's relationship with caregiver when they were a child: Not good, they argued a lot Patient's description of current relationship with people who raised him/her: Pt reports  they have passed How were you disciplined when you got in trouble as a child/adolescent?: Pt does not report Does patient have siblings?: Yes Number of Siblings: 2 Description of patient's current relationship with siblings: Pt reports they have bothe past (his brother and sister) Did patient suffer any verbal/emotional/physical/sexual abuse as a child?: No Did patient suffer from severe childhood neglect?: No Has patient ever been sexually abused/assaulted/raped as an adolescent or adult?: No Was the patient ever a victim of a crime or a disaster?: No Witnessed domestic violence?: No Has patient been affected by domestic violence as an adult?: No  Education:  Highest grade of school patient has completed: B.S. 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?: Yes (Describe in comment) (Pt reports 3 years in the [employer]) Did You Receive Any Psychiatric Treatment/Services While in the [employer]?: No  Financial Resources:   Financial resources: [employer], Receives SSI Does patient have a Lawyer or guardian?: Yes Name of representative payee or guardian: Pt reports his son is his payee  Alcohol/Substance Abuse:   What has been your use of drugs/alcohol within the last 12 months?: None reported If attempted suicide, did drugs/alcohol play a role in this?: No Alcohol/Substance Abuse Treatment Hx: Past Tx, Inpatient If yes, describe treatment: N/A Has alcohol/substance abuse ever caused legal problems?: No  Social Support System:   Patient's Community Support System: Good Describe Community Support System:  I support me, with the help of the government Type of faith/religion:  [employer] How does patient's  faith help to cope with current illness?: Pt does not report  Leisure/Recreation:   Do You Have Hobbies?: Yes Leisure and Hobbies: woodworking  Strengths/Needs:   What is the patient's perception of their strengths?: None reported by pt Patient states they can use these personal strengths during their treatment to contribute to their recovery: Pt does not report Patient states these barriers may affect/interfere with their treatment: None reported Patient states these barriers may affect their return to the community: None reported Other important information patient would like considered in planning for their treatment: None reported  Discharge Plan:   Currently receiving community mental health services: No Patient states concerns and preferences for aftercare planning are: Pt declines outpatient therapy or psychiatry Patient states they will know when they are safe and ready for discharge when: Pt reports he is ready to leave now Does patient have access to transportation?: Yes Does patient have financial barriers related to discharge medications?: No Patient description of barriers related to discharge medications: None reported Will patient be returning to same living situation after discharge?: Yes  Summary/Recommendations:   Summary and Recommendations (to be completed by the evaluator): Patient is a 89 year-old male from [place] [place] [facility] According to pt's H&P, 89 y.o. male with past medical history of anemia, asthma, MGUS, CKD, and stroke who presents to the ED complaining of suicidal ideation.  Patient reports that he has been feeling increasingly depressed recently after having to sell off his home and all of his belongings in order to move into a nursing facility.  He states that he is obsessed with not being here anymore and has been thinking about harming himself.  Upon assessment today, pt reports stressors as recently moving  as well as his wife passing 5 years ago. Pt reports that he does not want to harm himself but does state he has been feeling more depressed lately. Pt denies wanting outpatient therapy or psychiatry. Pt's primary diagnosis is  MDD (major depressive disorder), recurrent episode, severe (HCC) (F33.2).  Recommendations include: crisis stabilization, therapeutic milieu, encourage group attendance and participation, medication management for mood stabilization and development of comprehensive mental wellness/sobriety plan.  Lum JONETTA Croft. 02/11/2024

## 2024-02-11 NOTE — Discharge Summary (Signed)
 Physician Discharge Summary Note  Patient:  Elijah Collins. is an 89 y.o., male MRN:  [id number] DOB:  Jul 21, 1925 Patient phone:  [phone] (home)  Patient address:   [address] [place] [address],   Time spent more than 35 minutes Date of Admission:  02/08/2024 Date of Discharge: 02/11/24  Reason for Admission:  Elijah DIAZ Sr. is a 89 y.o. male with past medical history of anemia, asthma, MGUS, CKD, and stroke who presents to the ED complaining of suicidal ideation. Patient reports that he has been feeling increasingly depressed recently after having to sell off his home and all of his belongings in order to move into a nursing facility. He states that he is obsessed with not being here anymore and has been thinking about harming himself. He denies any specific plan at this time, has been taking medications as prescribed Patient is admitted to [facility] psych unit with Q15 min safety monitoring. Multidisciplinary team approach is offered. Medication management; group/milieu therapy is offered.   Principal Problem: MDD (major depressive disorder), recurrent episode, severe (HCC) Discharge Diagnoses: Principal Problem:   MDD (major depressive disorder), recurrent episode, severe (HCC)   Past Psychiatric History: see h&p  Family Psychiatric  History: see h&p Social History:  Social History   Substance and Sexual Activity  Alcohol Use No     Social History   Substance and Sexual Activity  Drug Use No    Social History   Socioeconomic History   Marital status: Widowed    Spouse name: Not on file   Number of children: Not on file   Years of education: Not on file   Highest education level: Not on file  Occupational History   Not on file  Tobacco Use   Smoking status: Never   Smokeless tobacco: Never  Vaping Use   Vaping status: Never Used  Substance and Sexual Activity   Alcohol use: No   Drug use: No   Sexual activity: Not Currently  Other Topics  Concern   Not on file  Social History Narrative   Not on file   Social Drivers of Health   Financial Resource Strain: Low Risk  (10/21/2023)   Received from [facility]   Overall Financial Resource Strain (CARDIA)    Difficulty of Paying Living Expenses: Not hard at all  Food Insecurity: No Food Insecurity (02/08/2024)   Hunger Vital Sign    Worried About Running Out of Food in the Last Year: Never true    Ran Out of Food in the Last Year: Never true  Transportation Needs: No Transportation Needs (02/08/2024)   PRAPARE - Administrator, Civil Service (Medical): No    Lack of Transportation (Non-Medical): No  Physical Activity: Sufficiently Active (06/26/2019)   Exercise Vital Sign    Days of Exercise per Week: 7 days    Minutes of Exercise per Session: 60 min  Stress: No Stress Concern Present (06/26/2019)   [employer] of Occupational Health - Occupational Stress Questionnaire    Feeling of Stress : Only a little  Social Connections: Moderately Isolated (02/08/2024)   Social Connection and Isolation Panel    Frequency of Communication with Friends and Family: Three times a week    Frequency of Social Gatherings with Friends and Family: Patient declined    Attends Religious Services: Patient declined    Active Member of Clubs or Organizations: No    Attends Banker Meetings: 1 to  4 times per year    Marital Status: Widowed   Past Medical History:  Past Medical History:  Diagnosis Date   Asthma    Cardiac arrhythmia    CHF (congestive heart failure) (HCC)    Hyperlipidemia    Shortness of breath    Stroke (HCC)    TIA (transient ischemic attack)     Past Surgical History:  Procedure Laterality Date   APPENDECTOMY  2011   [facility]   HERNIA REPAIR Right 10 years ago   [facility]  [facility]   PACEMAKER IMPLANT     PACEMAKER INSERTION Left 07/11/2019   Procedure: PACEMAKER CHANGEOUT;  Surgeon: Ammon Blunt, MD;  Location: [facility] ORS;   Service: Cardiovascular;  Laterality: Left;   Family History:  Family History  Problem Relation Age of Onset   CVA Mother    COPD Father     Hospital Course:  Elijah Petit. is a 89 y.o. male with past medical history of anemia, asthma, MGUS, CKD, and stroke who presents to the ED complaining of suicidal ideation. Patient reports that he has been feeling increasingly depressed recently after having to sell off his home and all of his belongings in order to move into a nursing facility. He states that he is obsessed with not being here anymore and has been thinking about harming himself. He denies any specific plan at this time, has been taking medications as prescribed Patient is admitted to [facility] psych unit with Q15 min safety monitoring. Multidisciplinary team approach is offered. Medication management; group/milieu therapy is offered.  Detailed risk assessment is complete based on clinical exam and individual risk factors and acute suicide risk is low and acute violence risk is low.     On Admission patient was started on Zoloft  25 mg daily for depression. Patient consistently maintained safety on the unit, participated in groups. Discussed with daughter and educated on safety precautions. Family feels safe for the patient to return home. On the day of discharge patient is noted to be consistently denying SI/HI/plan.  Currently, all modifiable risk of harm to self/harm to others have been addressed and patient is no longer appropriate for the acute inpatient setting and is able to continue treatment for mental health needs in the community with the supports as indicated below.  Patient is educated and verbalized understanding of discharge plan of care including medications, follow-up appointments, mental health resources and further crisis services in the community.  He is instructed to call 911 or present to the nearest emergency room should he experience any decompensation in mood, disturbance  of bowel or return of suicidal/homicidal ideations.  Patient verbalizes understanding of this education and agrees to this plan of care  Physical Findings: AIMS:  , ,  ,  ,    CIWA:    COWS:        Psychiatric Specialty Exam:  Presentation  General Appearance:  Appropriate for Environment; Casual  Eye Contact: Fair  Speech: Clear and Coherent  Speech Volume: Normal    Mood and Affect  Mood: Euthymic  Affect: Appropriate   Thought Process  Thought Processes: Coherent  Descriptions of Associations:Intact  Orientation:Partial  Thought Content:Logical  Hallucinations:Hallucinations: None  Ideas of Reference:None  Suicidal Thoughts:Suicidal Thoughts: No  Homicidal Thoughts:Homicidal Thoughts: No   Sensorium  Memory: Immediate Good; Immediate Fair; Remote Poor  Judgment: Fair  Insight: Fair   Chartered certified accountant: Fair  Attention Span: Fair  Recall: [employer] of Knowledge: Fair  Language:  Fair   Psychomotor Activity  Psychomotor Activity: Psychomotor Activity: Normal  Musculoskeletal: Strength & Muscle Tone: within normal limits Gait & Station: normal Assets  Assets: Manufacturing systems engineer; Desire for Improvement; Social Support   Sleep  Sleep: Sleep: Fair    Physical Exam: Physical Exam ROS Blood pressure 102/62, pulse 77, temperature (!) 97.1 F (36.2 C), resp. rate 18, height 5' 10 (1.778 m), weight 74.4 kg, SpO2 98%. Body mass index is 23.53 kg/m.   Social History   Tobacco Use  Smoking Status Never  Smokeless Tobacco Never   Tobacco Cessation:  N/A, patient does not currently use tobacco products   Blood Alcohol level:  No results found for: [facility]  Metabolic Disorder Labs:  Lab Results  Component Value Date   HGBA1C 5.9 (H) 07/12/2018   MPG 122.63 07/12/2018   No results found for: PROLACTIN Lab Results  Component Value Date   CHOL 128 07/12/2018   TRIG 55 07/12/2018   HDL  42 07/12/2018   CHOLHDL 3.0 07/12/2018   VLDL 11 07/12/2018   LDLCALC 75 07/12/2018    See Psychiatric Specialty Exam and Suicide Risk Assessment completed by Attending Physician prior to discharge.  Discharge destination:  Home  Is patient on multiple antipsychotic therapies at discharge:  No   Has Patient had three or more failed trials of antipsychotic monotherapy by history:  No  Recommended Plan for Multiple Antipsychotic Therapies: NA   Allergies as of 02/11/2024   No Known Allergies      Medication List     TAKE these medications      Indication  albuterol  108 (90 Base) MCG/ACT inhaler Commonly known as: VENTOLIN  HFA Inhale into the lungs.    aspirin  81 MG chewable tablet Chew 81 mg by mouth at bedtime.    clopidogrel  75 MG tablet Commonly known as: PLAVIX  Take 75 mg by mouth daily.    guaiFENesin -dextromethorphan  100-10 MG/5ML syrup Commonly known as: ROBITUSSIN DM Take 10 mLs by mouth every 4 (four) hours as needed for cough.    montelukast 10 MG tablet Commonly known as: SINGULAIR Take 1 tablet by mouth at bedtime.    Multi-Vitamins Tabs Take 1 tablet by mouth daily.    nitroGLYCERIN 0.4 MG SL tablet Commonly known as: NITROSTAT Place under the tongue. Place 1 tablet (0.4 mg total) under the tongue every 5 (five) minutes as needed for Chest pain May take up to 3 doses.    sertraline  25 MG tablet Commonly known as: ZOLOFT  Take 1 tablet (25 mg total) by mouth daily. Start taking on: February 12, 2024    torsemide 20 MG tablet Commonly known as: DEMADEX Take 20 mg by mouth daily.    traZODone 50 MG tablet Commonly known as: DESYREL Take 1 tablet by mouth at bedtime.    Trelegy Ellipta 100-62.5-25 MCG/ACT Aepb Generic drug: Fluticasone-Umeclidin-Vilant Inhale into the lungs.    vitamin B-12 500 MCG tablet Commonly known as: CYANOCOBALAMIN  Take 500 mcg by mouth daily.         Follow-up Information     [facility] . Go  to.   Why: Although you declined scheduled follow-up, I am putting walk-in hours, [facility] provides both Same-Day Access and Walk-In Crisis services. Same-Day Access means they will see you and connect you to appropriate services the same day, while Walk-In Crisis services are for immediate needs and will direct you to the relevant crisis service. Both are available during daytime business hours, typically 8 am  to 5 pm Contact information: [address] [place] [address] [phone]         [facility] Follow up.   Specialty: Urgent Care Why: [facility] provides timely access to mental health services for children and adolescents (ages 64-17) and adults presenting in a mental health crisis. The program is designed for those who need urgent behavioral health or substance use treatment and are not experiencing a medical crisis that would typically require an emergency room visit. Contact information: [address] [place]  [address] [phone]                Follow-up recommendations:  Activity:  As Tolerated    Signed: Allyn Foil, MD 02/11/2024, 12:24 PM

## 2024-06-13 ENCOUNTER — Inpatient Hospital Stay: Attending: Oncology

## 2024-06-13 DIAGNOSIS — N183 Chronic kidney disease, stage 3 unspecified: Secondary | ICD-10-CM | POA: Insufficient documentation

## 2024-06-13 DIAGNOSIS — D472 Monoclonal gammopathy: Secondary | ICD-10-CM | POA: Diagnosis present

## 2024-06-13 DIAGNOSIS — D631 Anemia in chronic kidney disease: Secondary | ICD-10-CM | POA: Insufficient documentation

## 2024-06-13 LAB — CBC WITH DIFFERENTIAL (CANCER CENTER ONLY)
Abs Immature Granulocytes: 0.03 K/uL (ref 0.00–0.07)
Basophils Absolute: 0 K/uL (ref 0.0–0.1)
Basophils Relative: 1 %
Eosinophils Absolute: 0.4 K/uL (ref 0.0–0.5)
Eosinophils Relative: 6 %
HCT: 35.7 % — ABNORMAL LOW (ref 39.0–52.0)
Hemoglobin: 11.7 g/dL — ABNORMAL LOW (ref 13.0–17.0)
Immature Granulocytes: 1 %
Lymphocytes Relative: 41 %
Lymphs Abs: 2.7 K/uL (ref 0.7–4.0)
MCH: 32.2 pg (ref 26.0–34.0)
MCHC: 32.8 g/dL (ref 30.0–36.0)
MCV: 98.3 fL (ref 80.0–100.0)
Monocytes Absolute: 0.6 K/uL (ref 0.1–1.0)
Monocytes Relative: 9 %
Neutro Abs: 2.8 K/uL (ref 1.7–7.7)
Neutrophils Relative %: 42 %
Platelet Count: 222 K/uL (ref 150–400)
RBC: 3.63 MIL/uL — ABNORMAL LOW (ref 4.22–5.81)
RDW: 12.8 % (ref 11.5–15.5)
WBC Count: 6.5 K/uL (ref 4.0–10.5)
nRBC: 0 % (ref 0.0–0.2)

## 2024-06-13 LAB — CMP (CANCER CENTER ONLY)
ALT: 14 U/L (ref 0–44)
AST: 18 U/L (ref 15–41)
Albumin: 3.7 g/dL (ref 3.5–5.0)
Alkaline Phosphatase: 29 U/L — ABNORMAL LOW (ref 38–126)
Anion gap: 7 (ref 5–15)
BUN: 27 mg/dL — ABNORMAL HIGH (ref 8–23)
CO2: 26 mmol/L (ref 22–32)
Calcium: 9.1 mg/dL (ref 8.9–10.3)
Chloride: 105 mmol/L (ref 98–111)
Creatinine: 1.38 mg/dL — ABNORMAL HIGH (ref 0.61–1.24)
GFR, Estimated: 47 mL/min — ABNORMAL LOW (ref >60)
Glucose, Bld: 107 mg/dL — ABNORMAL HIGH (ref 70–99)
Potassium: 4.2 mmol/L (ref 3.5–5.1)
Sodium: 138 mmol/L (ref 135–145)
Total Bilirubin: 0.6 mg/dL (ref 0.0–1.2)
Total Protein: 7.1 g/dL (ref 6.5–8.1)

## 2024-06-13 LAB — IRON AND TIBC
Iron: 99 ug/dL (ref 45–182)
Saturation Ratios: 24 % (ref 17.9–39.5)
TIBC: 416 ug/dL (ref 250–450)
UIBC: 317 ug/dL

## 2024-06-13 LAB — FERRITIN: Ferritin: 95 ng/mL (ref 24–336)

## 2024-06-14 LAB — KAPPA/LAMBDA LIGHT CHAINS
Kappa free light chain: 20.5 mg/L — ABNORMAL HIGH (ref 3.3–19.4)
Kappa, lambda light chain ratio: 0.33 (ref 0.26–1.65)
Lambda free light chains: 61.2 mg/L — ABNORMAL HIGH (ref 5.7–26.3)

## 2024-06-16 LAB — MULTIPLE MYELOMA PANEL, SERUM
Albumin SerPl Elph-Mcnc: 3.4 g/dL (ref 2.9–4.4)
Albumin/Glob SerPl: 1.1 (ref 0.7–1.7)
Alpha 1: 0.2 g/dL (ref 0.0–0.4)
Alpha2 Glob SerPl Elph-Mcnc: 0.8 g/dL (ref 0.4–1.0)
B-Globulin SerPl Elph-Mcnc: 1 g/dL (ref 0.7–1.3)
Gamma Glob SerPl Elph-Mcnc: 1.4 g/dL (ref 0.4–1.8)
Globulin, Total: 3.3 g/dL (ref 2.2–3.9)
IgA: 45 mg/dL — ABNORMAL LOW (ref 61–437)
IgG (Immunoglobin G), Serum: 569 mg/dL — ABNORMAL LOW (ref 603–1613)
IgM (Immunoglobulin M), Srm: 1165 mg/dL — ABNORMAL HIGH (ref 15–143)
M Protein SerPl Elph-Mcnc: 0.8 g/dL — ABNORMAL HIGH
Total Protein ELP: 6.7 g/dL (ref 6.0–8.5)

## 2024-06-23 ENCOUNTER — Inpatient Hospital Stay: Admitting: Oncology

## 2024-06-23 ENCOUNTER — Encounter: Payer: Self-pay | Admitting: Oncology

## 2024-06-23 VITALS — BP 97/79 | HR 84 | Temp 96.5°F | Resp 18 | Wt 184.2 lb

## 2024-06-23 DIAGNOSIS — D631 Anemia in chronic kidney disease: Secondary | ICD-10-CM | POA: Diagnosis not present

## 2024-06-23 DIAGNOSIS — N183 Chronic kidney disease, stage 3 unspecified: Secondary | ICD-10-CM | POA: Diagnosis not present

## 2024-06-23 DIAGNOSIS — D472 Monoclonal gammopathy: Secondary | ICD-10-CM | POA: Diagnosis not present

## 2024-06-23 DIAGNOSIS — N1831 Chronic kidney disease, stage 3a: Secondary | ICD-10-CM

## 2024-06-23 NOTE — Progress Notes (Signed)
 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 Infrequently, IgM MGUS can progress to IgM multiple myeloma. Previously discussed with patient that additional testing includes bone marrow biopsy, PET or skeletal survey. Due to his advance age, share decision was made to hold off additional work up, and continue follow up with lab work Q6 months Lab Results  Component Value Date   MPROTEIN 0.8 (H) 06/13/2024   KPAFRELGTCHN 20.5 (H) 06/13/2024   LAMBDASER 61.2 (H) 06/13/2024   KAPLAMBRATIO 0.33 06/13/2024    Recommend observation.   Anemia in chronic kidney disease (CKD)  Lab Results  Component Value Date   HGB 11.7 (L) 06/13/2024   TIBC 416 06/13/2024   IRONPCTSAT 24 06/13/2024   FERRITIN 95 06/13/2024    Slightly decreased hemoglobin- anemia due to CKD. stable  CKD (chronic kidney disease), stage III (HCC) Encourage oral hydration and avoid nephrotoxins.  Mild anemia, observe.   Orders Placed This Encounter  Procedures   CBC with Differential (Cancer Center Only)    Standing Status:   Future    Expected Date:   12/21/2024    Expiration Date:   03/21/2025   CMP (Cancer Center only)    Standing Status:   Future    Expected Date:   12/21/2024    Expiration Date:   03/21/2025   Multiple Myeloma Panel (SPEP&IFE w/QIG)    Standing Status:   Future    Expected Date:   12/21/2024    Expiration Date:   03/21/2025    Kappa/lambda light chains    Standing Status:   Future    Expected Date:   12/21/2024    Expiration Date:   03/21/2025   Iron and TIBC    Standing Status:   Future    Expected Date:   12/21/2024    Expiration Date:   03/21/2025   Ferritin    Standing Status:   Future    Expected Date:   12/21/2024    Expiration Date:   03/21/2025   Follow up in  6 months.  All questions were answered. The patient knows to call the clinic with any problems, questions or concerns.  Zelphia Cap, MD, PhD [facility] Hematology Oncology 06/23/2024     HISTORY OF PRESENTING ILLNESS:  Elijah Collins. is a  89 y.o.  male with PMH listed below who was referred to me for anemia Reviewed patient's recent labs that was done.  He was found to have chronic anemia, and MCV is trending up.  Since his COVID 19 infection , has experienced SOB, fatigue. Was seen by pulmonology recently.   He had not noticed any recent bleeding such as epistaxis, hematuria or hematochezia.  He denies over the counter NSAID ingestion. He is on antiplatelets agent - Aspirin  81mg  daily and Plavix . He eats TV lunches and dinners.   INTERVAL HISTORY Elijah Collins. is a 89 y.o. male who has above history reviewed by me today presents for follow up visit for MGUS  He reports feeling well. No new concerns   MEDICAL HISTORY:  Past Medical History:  Diagnosis Date   Asthma    Cardiac arrhythmia    CHF (congestive heart failure) (HCC)    Hyperlipidemia    Shortness of breath    Stroke (HCC)    TIA (transient ischemic attack)     SURGICAL HISTORY: Past Surgical History:  Procedure Laterality Date   APPENDECTOMY  2011   [facility]   HERNIA REPAIR Right 10 years ago   [facility]   PACEMAKER IMPLANT     PACEMAKER INSERTION Left 07/11/2019   Procedure: PACEMAKER CHANGEOUT;  Surgeon: Ammon Blunt, MD;  Location: [facility] ORS;  Service: Cardiovascular;  Laterality: Left;    SOCIAL HISTORY: Social History   Socioeconomic History    Marital status: Widowed    Spouse name: Not on file   Number of children: Not on file   Years of education: Not on file   Highest education level: Not on file  Occupational History   Not on file  Tobacco Use   Smoking status: Never   Smokeless tobacco: Never  Vaping Use   Vaping status: Never Used  Substance and Sexual Activity   Alcohol use: No   Drug use: No   Sexual activity: Not Currently  Other Topics Concern   Not on file  Social History Narrative   Not on file   Social Drivers of Health   Financial Resource Strain: Low Risk  (10/21/2023)   Received from [facility]   Overall Financial Resource Strain (CARDIA)    Difficulty of Paying Living Expenses: Not hard at all  Food Insecurity: No Food Insecurity (02/08/2024)   Hunger Vital Sign    Worried About Running Out of Food in the Last Year: Never true    Ran Out of Food in the Last Year: Never true  Transportation Needs: No Transportation Needs (02/08/2024)   PRAPARE - Administrator, Civil Service (Medical): No    Lack of Transportation (Non-Medical): No  Physical Activity: Sufficiently Active (06/26/2019)   Exercise Vital Sign    Days of Exercise per Week: 7 days    Minutes of Exercise per Session: 60 min  Stress: No Stress Concern Present (06/26/2019)   [employer] of Occupational Health - Occupational Stress Questionnaire    Feeling of Stress : Only a little  Social Connections: Moderately Isolated (02/08/2024)   Social Connection and Isolation Panel    Frequency of Communication with Friends and Family: Three times a week    Frequency of Social Gatherings with Friends and Family: Patient declined    Attends Religious Services: Patient declined    Active Member of Clubs or Organizations: No    Attends Engineer, Structural: 1 to 4 times per year    Marital Status: Widowed  Intimate Partner Violence: Not At Risk (02/08/2024)   Humiliation, Afraid, Rape, and Kick questionnaire     Fear of Current or Ex-Partner: No    Emotionally Abused: No    Physically Abused: No    Sexually Abused: No    FAMILY HISTORY: Family History  Problem Relation Age of Onset   CVA Mother    COPD Father     ALLERGIES:  has no known allergies.  MEDICATIONS:  Current Outpatient Medications  Medication Sig Dispense Refill   Multiple Vitamin (MULTI-VITAMINS) TABS Take 1 tablet by mouth daily.     nitroGLYCERIN (NITROSTAT) 0.4 MG SL tablet Place under the tongue.  Place 1 tablet (0.4 mg total) under the tongue every 5 (five) minutes as needed for Chest pain May take up to 3 doses.     sertraline  (ZOLOFT ) 25 MG tablet Take 1 tablet (25 mg total) by mouth daily. 30 tablet 0   Vitamin D, Ergocalciferol, (DRISDOL) 1.25 MG (50000 UNIT) CAPS capsule Take 50,000 Units by mouth once a week.     albuterol  (VENTOLIN  HFA) 108 (90 Base) MCG/ACT inhaler Inhale into the lungs. (Patient not taking: Reported on 06/23/2024)     aspirin  81 MG chewable tablet Chew 81 mg by mouth at bedtime. (Patient not taking: Reported on 06/23/2024)     clopidogrel  (PLAVIX ) 75 MG tablet Take 75 mg by mouth daily. (Patient not taking: Reported on 06/23/2024)     guaiFENesin -dextromethorphan  (ROBITUSSIN DM) 100-10 MG/5ML syrup Take 10 mLs by mouth every 4 (four) hours as needed for cough. (Patient not taking: Reported on 06/23/2024) 118 mL 0   torsemide (DEMADEX) 20 MG tablet Take 20 mg by mouth daily. (Patient not taking: Reported on 06/23/2024)     traZODone (DESYREL) 50 MG tablet Take 1 tablet by mouth at bedtime. (Patient not taking: Reported on 06/23/2024)     TRELEGY ELLIPTA 100-62.5-25 MCG/ACT AEPB Inhale into the lungs. (Patient not taking: Reported on 06/23/2024)     vitamin B-12 (CYANOCOBALAMIN ) 500 MCG tablet Take 500 mcg by mouth daily. (Patient not taking: Reported on 06/23/2024)     No current facility-administered medications for this visit.    Review of Systems  Constitutional:  Positive for fatigue.  Negative for appetite change, chills, fever and unexpected weight change.  HENT:   Negative for hearing loss and voice change.   Eyes:  Negative for eye problems and icterus.  Respiratory:  Positive for shortness of breath. Negative for chest tightness and cough.   Cardiovascular:  Negative for chest pain and leg swelling.  Gastrointestinal:  Negative for abdominal distention and abdominal pain.  Endocrine: Negative for hot flashes.  Genitourinary:  Negative for difficulty urinating, dysuria and frequency.   Musculoskeletal:  Negative for arthralgias.  Skin:  Negative for itching and rash.  Neurological:  Negative for light-headedness and numbness.  Hematological:  Negative for adenopathy. Does not bruise/bleed easily.  Psychiatric/Behavioral:  Negative for confusion.     PHYSICAL EXAMINATION: ECOG PERFORMANCE STATUS: 2 - Symptomatic, <50% confined to bed Vitals:   06/23/24 1025  BP: 97/79  Pulse: 84  Resp: 18  Temp: (!) 96.5 F (35.8 C)  SpO2: 100%   Filed Weights   06/23/24 1025  Weight: 184 lb 3.2 oz (83.6 kg)    Physical Exam Constitutional:      General: He is not in acute distress. HENT:     Head: Normocephalic and atraumatic.  Eyes:     General: No scleral icterus. Cardiovascular:     Rate and Rhythm: Normal rate and regular rhythm.  Pulmonary:     Effort: Pulmonary effort is normal. No respiratory distress.     Breath sounds: No wheezing.  Abdominal:     General: There is no distension.     Palpations: Abdomen is soft.  Musculoskeletal:        General: No deformity. Normal range of motion.     Cervical back: Normal range of motion and neck supple.  Skin:    General: Skin is warm and dry.     Findings: No erythema.  Neurological:     Mental Status: He is alert and oriented to person, place, and  time. Mental status is at baseline.     Cranial Nerves: No cranial nerve deficit.  Psychiatric:        Mood and Affect: Mood normal.      LABORATORY DATA:   I have reviewed the data as listed    Latest Ref Rng & Units 06/13/2024    9:17 AM 02/08/2024   11:30 AM 12/03/2023    9:01 AM  CBC  WBC 4.0 - 10.5 K/uL 6.5  6.9  6.7   Hemoglobin 13.0 - 17.0 g/dL 88.2  88.0  87.8   Hematocrit 39.0 - 52.0 % 35.7  35.9  37.1   Platelets 150 - 400 K/uL 222  192  207       Latest Ref Rng & Units 06/13/2024    9:17 AM 02/08/2024   11:30 AM 12/03/2023    9:02 AM  CMP  Glucose 70 - 99 mg/dL 892  98  874   BUN 8 - 23 mg/dL 27  24  28    Creatinine 0.61 - 1.24 mg/dL 8.61  8.78  8.66   Sodium 135 - 145 mmol/L 138  138  140   Potassium 3.5 - 5.1 mmol/L 4.2  4.2  4.4   Chloride 98 - 111 mmol/L 105  107  107   CO2 22 - 32 mmol/L 26  24  25    Calcium  8.9 - 10.3 mg/dL 9.1  9.0  9.5   Total Protein 6.5 - 8.1 g/dL 7.1  6.1  7.2   Total Bilirubin 0.0 - 1.2 mg/dL 0.6  0.8  0.6   Alkaline Phos 38 - 126 U/L 29  24  29    AST 15 - 41 U/L 18  20  18    ALT 0 - 44 U/L 14  15  14        Component Value Date/Time   IRON 99 06/13/2024 0903   TIBC 416 06/13/2024 0903   FERRITIN 95 06/13/2024 0903   IRONPCTSAT 24 06/13/2024 0903     RADIOGRAPHIC STUDIES: I have personally reviewed the radiological images as listed and agreed with the findings in the report. No results found.

## 2024-06-23 NOTE — Assessment & Plan Note (Signed)
  Lab Results  Component Value Date   HGB 11.7 (L) 06/13/2024   TIBC 416 06/13/2024   IRONPCTSAT 24 06/13/2024   FERRITIN 95 06/13/2024    Slightly decreased hemoglobin- anemia due to CKD. stable

## 2024-06-23 NOTE — Assessment & Plan Note (Signed)
Encourage oral hydration and avoid nephrotoxins.  Mild anemia, observe.

## 2024-06-23 NOTE — Assessment & Plan Note (Addendum)
 IgM Lamda MGUS I discussed with patient about the diagnosis of IgM MGUS which is an asymptomatic condition which has a small risk of progression to smoldering Waldenstrm macroglobulinemia and to symptomatic Waldenstrm macroglobulinemia, and less often to lymphoma or AL amyloidosis. Infrequently, IgM MGUS can progress to IgM multiple myeloma. Previously discussed with patient that additional testing includes bone marrow biopsy, PET or skeletal survey. Due to his advance age, share decision was made to hold off additional work up, and continue follow up with lab work Q6 months Lab Results  Component Value Date   MPROTEIN 0.8 (H) 06/13/2024   KPAFRELGTCHN 20.5 (H) 06/13/2024   LAMBDASER 61.2 (H) 06/13/2024   KAPLAMBRATIO 0.33 06/13/2024    Recommend observation.

## 2024-12-19 ENCOUNTER — Inpatient Hospital Stay

## 2024-12-29 ENCOUNTER — Inpatient Hospital Stay: Admitting: Oncology
